# Patient Record
Sex: Female | Born: 1965
Health system: Southern US, Community
[De-identification: ages and names within clinical notes are randomized; demographics above are authoritative.]

## PROBLEM LIST (undated history)

## (undated) DIAGNOSIS — E039 Hypothyroidism, unspecified: Secondary | ICD-10-CM

## (undated) DIAGNOSIS — E7800 Pure hypercholesterolemia, unspecified: Secondary | ICD-10-CM

## (undated) DIAGNOSIS — I10 Essential (primary) hypertension: Secondary | ICD-10-CM

## (undated) DIAGNOSIS — R42 Dizziness and giddiness: Secondary | ICD-10-CM

## (undated) DIAGNOSIS — E119 Type 2 diabetes mellitus without complications: Secondary | ICD-10-CM

## (undated) DIAGNOSIS — A6004 Herpesviral vulvovaginitis: Secondary | ICD-10-CM

## (undated) DIAGNOSIS — E785 Hyperlipidemia, unspecified: Secondary | ICD-10-CM

## (undated) HISTORY — PX: TUBAL LIGATION: SHX77

## (undated) HISTORY — PX: OTHER SURGICAL HISTORY: SHX169

---
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
## 2000-08-31 HISTORY — PX: DILATION AND CURETTAGE, DIAGNOSTIC / THERAPEUTIC: SUR384

## 2000-10-20 ENCOUNTER — Other Ambulatory Visit: Admission: RE | Admit: 2000-10-20 | Discharge: 2000-10-20 | Payer: Self-pay | Admitting: Family Medicine

## 2000-12-16 ENCOUNTER — Ambulatory Visit (HOSPITAL_COMMUNITY): Admission: RE | Admit: 2000-12-16 | Discharge: 2000-12-16 | Payer: Self-pay | Admitting: Family Medicine

## 2000-12-16 ENCOUNTER — Encounter: Payer: Self-pay | Admitting: Family Medicine

## 2001-02-21 ENCOUNTER — Encounter: Payer: Self-pay | Admitting: Family Medicine

## 2001-02-21 ENCOUNTER — Ambulatory Visit (HOSPITAL_COMMUNITY): Admission: RE | Admit: 2001-02-21 | Discharge: 2001-02-21 | Payer: Self-pay | Admitting: Family Medicine

## 2001-05-04 ENCOUNTER — Encounter: Admission: RE | Admit: 2001-05-04 | Discharge: 2001-05-04 | Payer: Self-pay | Admitting: Obstetrics and Gynecology

## 2001-05-04 ENCOUNTER — Encounter: Payer: Self-pay | Admitting: Obstetrics and Gynecology

## 2001-06-01 ENCOUNTER — Encounter (INDEPENDENT_AMBULATORY_CARE_PROVIDER_SITE_OTHER): Payer: Self-pay | Admitting: *Deleted

## 2001-06-01 ENCOUNTER — Ambulatory Visit (HOSPITAL_COMMUNITY): Admission: RE | Admit: 2001-06-01 | Discharge: 2001-06-01 | Payer: Self-pay | Admitting: Obstetrics and Gynecology

## 2002-01-31 ENCOUNTER — Encounter: Admission: RE | Admit: 2002-01-31 | Discharge: 2002-01-31 | Payer: Self-pay | Admitting: Obstetrics and Gynecology

## 2002-01-31 ENCOUNTER — Encounter: Payer: Self-pay | Admitting: Obstetrics and Gynecology

## 2004-06-04 ENCOUNTER — Other Ambulatory Visit: Admission: RE | Admit: 2004-06-04 | Discharge: 2004-06-04 | Payer: Self-pay | Admitting: Family Medicine

## 2004-12-04 ENCOUNTER — Emergency Department (HOSPITAL_COMMUNITY): Admission: EM | Admit: 2004-12-04 | Discharge: 2004-12-04 | Payer: Self-pay | Admitting: Family Medicine

## 2005-04-10 ENCOUNTER — Emergency Department (HOSPITAL_COMMUNITY): Admission: EM | Admit: 2005-04-10 | Discharge: 2005-04-10 | Payer: Self-pay | Admitting: Family Medicine

## 2006-10-07 ENCOUNTER — Emergency Department (HOSPITAL_COMMUNITY): Admission: EM | Admit: 2006-10-07 | Discharge: 2006-10-07 | Payer: Self-pay | Admitting: Family Medicine

## 2006-10-14 ENCOUNTER — Other Ambulatory Visit: Admission: RE | Admit: 2006-10-14 | Discharge: 2006-10-14 | Payer: Self-pay | Admitting: Family Medicine

## 2008-05-31 ENCOUNTER — Encounter: Payer: Self-pay | Admitting: Gynecology

## 2008-05-31 ENCOUNTER — Ambulatory Visit: Payer: Self-pay | Admitting: Gynecology

## 2008-05-31 ENCOUNTER — Other Ambulatory Visit: Admission: RE | Admit: 2008-05-31 | Discharge: 2008-05-31 | Payer: Self-pay | Admitting: Gynecology

## 2008-06-07 ENCOUNTER — Ambulatory Visit: Payer: Self-pay | Admitting: Gynecology

## 2008-06-20 ENCOUNTER — Encounter: Admission: RE | Admit: 2008-06-20 | Discharge: 2008-06-20 | Payer: Self-pay | Admitting: Gynecology

## 2009-05-20 ENCOUNTER — Ambulatory Visit: Payer: Self-pay | Admitting: Gynecology

## 2009-05-22 ENCOUNTER — Ambulatory Visit: Payer: Self-pay | Admitting: Gynecology

## 2009-05-23 ENCOUNTER — Encounter: Payer: Self-pay | Admitting: Gynecology

## 2009-05-23 ENCOUNTER — Ambulatory Visit: Payer: Self-pay | Admitting: Gynecology

## 2009-05-23 ENCOUNTER — Ambulatory Visit (HOSPITAL_BASED_OUTPATIENT_CLINIC_OR_DEPARTMENT_OTHER): Admission: RE | Admit: 2009-05-23 | Discharge: 2009-05-23 | Payer: Self-pay | Admitting: Gynecology

## 2009-06-10 ENCOUNTER — Ambulatory Visit: Payer: Self-pay | Admitting: Gynecology

## 2009-09-06 ENCOUNTER — Ambulatory Visit: Payer: Self-pay | Admitting: Gynecology

## 2009-10-07 ENCOUNTER — Other Ambulatory Visit: Admission: RE | Admit: 2009-10-07 | Discharge: 2009-10-07 | Payer: Self-pay | Admitting: Gynecology

## 2009-10-09 ENCOUNTER — Ambulatory Visit: Payer: Self-pay | Admitting: Gynecology

## 2009-10-18 ENCOUNTER — Encounter: Admission: RE | Admit: 2009-10-18 | Discharge: 2009-10-18 | Payer: Self-pay | Admitting: Gynecology

## 2010-10-23 ENCOUNTER — Other Ambulatory Visit (HOSPITAL_COMMUNITY)
Admission: RE | Admit: 2010-10-23 | Discharge: 2010-10-23 | Disposition: A | Discharge status: Home / Self Care | Payer: BC Managed Care – PPO | Source: Ambulatory Visit | Attending: Family Medicine | Admitting: Family Medicine

## 2010-10-23 ENCOUNTER — Other Ambulatory Visit: Payer: Self-pay | Admitting: Family Medicine

## 2010-10-23 DIAGNOSIS — Z0000 Encounter for general adult medical examination without abnormal findings: Secondary | ICD-10-CM | POA: Insufficient documentation

## 2010-10-31 ENCOUNTER — Other Ambulatory Visit: Payer: Self-pay | Admitting: Family Medicine

## 2010-10-31 DIAGNOSIS — Z1231 Encounter for screening mammogram for malignant neoplasm of breast: Secondary | ICD-10-CM

## 2010-11-10 ENCOUNTER — Ambulatory Visit
Admission: RE | Admit: 2010-11-10 | Discharge: 2010-11-10 | Disposition: A | Discharge status: Home / Self Care | Payer: BC Managed Care – PPO | Source: Ambulatory Visit | Attending: Family Medicine | Admitting: Family Medicine

## 2010-11-10 DIAGNOSIS — Z1231 Encounter for screening mammogram for malignant neoplasm of breast: Secondary | ICD-10-CM

## 2011-01-16 NOTE — H&P (Signed)
[facility]  Patient:    Diana Fleming, Diana Fleming Visit Number: [id number] MRN: [id number]          Service Type: Attending:  Beather Arbour. Thomasena Fleming, M.D. Dictated by:   Diana Fleming, M.D. Adm. Date:  06/01/01   CC:         Diana Fleming, M.D.   History and Physical  DATE OF BIRTH:                [birth year]  HISTORY OF PRESENT ILLNESS:   The patient is a 45 year old African-American female referred by Dr. Merri Fleming to rule out endometrial hyperplasia. Patient was complaining of lower abdominal pain with her menstrual cycles for which she subsequently had undergone a pelvic ultrasound.  Pelvic ultrasound showed a right ovarian cyst with subsequent resolution on a followup study. However, on ultrasound she was found to have an abnormally thickened endometrium measuring 15 mm and she had just completed her menstrual period. Patient does state that her cycles are every 28 days with a seven day duration of flow.  She uses three pads on her heaviest day.  She subsequently underwent a sonohysterogram which showed irregular nodular thickening of the endometrium with two polypoid masses possibly due to large polyp.  Endometrial carcinoma could not be ruled out.  Due to the polypoid configuration, it was decided that the patient should undergo a D&C, hysteroscopy to remove the polyp. Risks of surgery including anesthetic complications, hemorrhage, infection, damage to adjacent structures including bladder, bowel, blood vessels, ureters discussed with the patient.  She was made aware of the risk of uterine perforation which could result in overwhelming life threatening hemorrhage requiring emergent hysterectomy or uterine perforation which could result in bowel damage or resulting in overwhelming life threatening peritonitis or which could result in bowel damage requiring colostomy.  Patient expresses understanding of and acceptance of these risks and  desires to proceed.  PAST GYNECOLOGICAL HISTORY:   Menarche at age 45.  Contraception:  Tubal ligation.  Cycle interval every 28 days.  History of herpes genitalia. Patient has no history of abnormal Paps.  PAST MEDICAL HISTORY:         1. Resolved ovarian cyst.                               2. History of bacterial vaginosis.  ALLERGIES:                    No known drug allergies.  CURRENT MEDICATIONS:          Valtrex for HSV.  PAST SURGICAL HISTORY:        Tubal ligation November 22, 1991.  FAMILY HISTORY:               The patients father is unknown to her.  Mother is 68 with heart disease.  Patient has no siblings.  She has two children ages 1 and 55 who are alive and well.  SOCIAL HISTORY:               Native of [place].  Patient works in the Heritage manager at [employer].  She is married.  Does not smoke, drink, or use recreational drugs.  REVIEW OF SYSTEMS:            Noncontributory except as noted above.  Denies headaches, visual changes, chest pain, shortness of breath, abdominal pain, change in  bowel habits, unintentional weight loss.  Also denies dysuria, urgency, frequency, vaginal pruritus or discharge, pain or bleeding with intercourse.  PHYSICAL EXAMINATION  GENERAL:                      Well-developed African-American female.  VITAL SIGNS:                  Blood pressure 130/70, weight 211 pounds.  LMP May 11, 2001.  HEENT:                        Normal.  NECK:                         Supple without thyromegaly, adenopathy, or nodules.  CHEST:                        Clear to auscultation.  BREASTS:                      Symmetrical without masses, retraction, or nipple discharge.  CARDIAC:                      Regular rate and rhythm without extra sounds or murmurs.  ABDOMEN:                      Soft and nontender.  No hepatosplenomegaly or masses.  EXTREMITIES:                  No edema.  NEUROLOGIC:                    Oriented x 3.  Grossly normal.  PELVIC:                       Normal external female genitalia.  No vulvar, vaginal, cervical lesions.  Pap smear was performed by Dr. Merri Fleming recently and was noted to be within normal limits.  Bimanual examination reveals the uterus to be normal and mobile without any adnexal mass palpated.  IMPRESSION AND PLAN:          The patient is a 45 year old para 2 African-American female with thickened endometrium on ultrasound subsequently found to have possible endometrial polyps on sonohysterogram.  She is admitted for a dilatation and curettage, hysteroscopy to rule out endometrial hyperplasia or endometrial carcinoma and to remove the polyp.  Risks have been discussed with her and she expresses understanding of and acceptance of these risks. Dictated by:   Diana Fleming, M.D. Attending:  Beather Arbour. Thomasena Fleming, M.D. DD:  06/01/01 TD:  06/01/01 Job: [id number]

## 2011-01-16 NOTE — Op Note (Signed)
[facility]  Patient:    Diana Fleming, Diana Fleming Visit Number: [id number] MRN: [id number]          Service Type: Attending:  Beather Arbour. Thomasena Edis, M.D. Dictated by:   Beather Arbour Thomasena Edis, M.D.   CC:         Dario Guardian, M.D.   Operative Report  SURGEON:                      Lafonda Mosses B. Thomasena Edis, M.D.  ANESTHESIA:                   Monitored anesthesia care plus 1% lidocaine, 10 cc, paracervical block.  FLUIDS:                       2000 cc of crystalloid.  ESTIMATED BLOOD LOSS:         50 cc.  COMPLICATIONS:                None.  DRAINS:                       None.  FINDINGS:                     Copious polypoid endometrium which was significantly thickened.  DESCRIPTION OF PROCEDURE:     The patient was brought to the operating room and identified on the operating room table.  After the patient was adequately sedated using fentanyl, Versed and Diprivan, she was placed in the dorsal lithotomy position, prepped and draped in the usual sterile fashion.  The bladder was straight catheterized for approximately 100 cc of clear yellow urine.  Examination under anesthesia revealed the uterus to be approximately eight weeks and very anteverted.  There were no adnexal masses palpated.  The speculum was placed and the anterior lip of the cervix was infiltrated with 1 cc of 1% lidocaine and grasped with a single-tooth tenaculum.  The remaining 9 cc of 1% lidocaine was infused for paracervical block.  The cervix was then very gently dilated up to a #25 Pratt dilator.  The patients endometrial canal was noted to tract extremely anteriorly.  Dilatation proceeded very carefully and gently.  The [facility] hysteroscope was placed and, using Sorbitol as a distending medium, a very careful and thorough hysteroscopic examination was performed.  I was able to visualize both tubal ostia.  There was noted to be copious cystic-appearing polypoid endometrium which was very  markedly thickened.  After careful hysteroscopic evaluation, the hysteroscope was removed.  A very careful and thorough curettage was performed.  Copious tissue was obtained.  I had to curet numerous times to adequately remove this abnormal endometrium.  The Randall stone forceps were placed and additional polypoid tissue was obtained.  After a good cry was heard all around, the procedure was then terminated.  There was noted to be a small amount of bleeding of the tenaculum site.  Excellent hemostasis was achieved using silver nitrate sticks.  At that point, the procedure was terminated.  The patient tolerated the procedure well without apparent complications and was transferred the patient the recovery room in stable condition after all sponge, needle and instrument counts were correct.  The specimen was sent to pathology.  The patient received a postoperative instruction sheet, was urged to refrain from intercourse or anything in her vagina for 2-3 weeks.  She is to take ibuprofen, 400-600 mg q.6h. p.r.n.  pain.  She is to call with any problems.  She is to return in 2-3 weeks for postoperative evaluation. Dictated by:   Beather Arbour Thomasena Edis, M.D. Attending:  Beather Arbour. Thomasena Edis, M.D. DD:  06/01/01 TD:  06/01/01 Job: [id number]

## 2011-10-29 ENCOUNTER — Other Ambulatory Visit: Payer: Self-pay | Admitting: Nurse Practitioner

## 2011-10-29 ENCOUNTER — Other Ambulatory Visit (HOSPITAL_COMMUNITY)
Admission: RE | Admit: 2011-10-29 | Discharge: 2011-10-29 | Disposition: A | Discharge status: Home / Self Care | Payer: BC Managed Care – PPO | Source: Ambulatory Visit | Attending: Obstetrics and Gynecology | Admitting: Obstetrics and Gynecology

## 2011-10-29 DIAGNOSIS — Z1159 Encounter for screening for other viral diseases: Secondary | ICD-10-CM | POA: Insufficient documentation

## 2011-10-29 DIAGNOSIS — Z01419 Encounter for gynecological examination (general) (routine) without abnormal findings: Secondary | ICD-10-CM | POA: Insufficient documentation

## 2011-10-29 DIAGNOSIS — Z1231 Encounter for screening mammogram for malignant neoplasm of breast: Secondary | ICD-10-CM

## 2011-10-29 DIAGNOSIS — N760 Acute vaginitis: Secondary | ICD-10-CM | POA: Insufficient documentation

## 2011-11-11 ENCOUNTER — Ambulatory Visit
Admission: RE | Admit: 2011-11-11 | Discharge: 2011-11-11 | Disposition: A | Discharge status: Home / Self Care | Payer: BC Managed Care – PPO | Source: Ambulatory Visit | Attending: Nurse Practitioner | Admitting: Nurse Practitioner

## 2011-11-11 DIAGNOSIS — Z1231 Encounter for screening mammogram for malignant neoplasm of breast: Secondary | ICD-10-CM

## 2012-11-16 ENCOUNTER — Other Ambulatory Visit: Payer: Self-pay | Admitting: Nurse Practitioner

## 2012-11-16 ENCOUNTER — Other Ambulatory Visit (HOSPITAL_COMMUNITY)
Admission: RE | Admit: 2012-11-16 | Discharge: 2012-11-16 | Disposition: A | Discharge status: Home / Self Care | Payer: 59 | Source: Ambulatory Visit | Attending: Obstetrics and Gynecology | Admitting: Obstetrics and Gynecology

## 2012-11-16 DIAGNOSIS — Z01419 Encounter for gynecological examination (general) (routine) without abnormal findings: Secondary | ICD-10-CM | POA: Insufficient documentation

## 2012-11-16 DIAGNOSIS — N760 Acute vaginitis: Secondary | ICD-10-CM | POA: Insufficient documentation

## 2013-01-11 ENCOUNTER — Other Ambulatory Visit: Payer: Self-pay

## 2013-01-11 DIAGNOSIS — Z1231 Encounter for screening mammogram for malignant neoplasm of breast: Secondary | ICD-10-CM

## 2013-01-12 ENCOUNTER — Ambulatory Visit
Admission: RE | Admit: 2013-01-12 | Discharge: 2013-01-12 | Disposition: A | Discharge status: Home / Self Care | Payer: 59 | Source: Ambulatory Visit

## 2013-01-12 DIAGNOSIS — Z1231 Encounter for screening mammogram for malignant neoplasm of breast: Secondary | ICD-10-CM

## 2013-11-23 ENCOUNTER — Other Ambulatory Visit: Payer: Self-pay

## 2013-11-23 DIAGNOSIS — Z1231 Encounter for screening mammogram for malignant neoplasm of breast: Secondary | ICD-10-CM

## 2014-01-15 ENCOUNTER — Ambulatory Visit: Payer: 59

## 2018-01-31 ENCOUNTER — Encounter (HOSPITAL_COMMUNITY): Payer: Self-pay

## 2018-01-31 ENCOUNTER — Inpatient Hospital Stay (HOSPITAL_COMMUNITY)
Admission: EM | Admit: 2018-01-31 | Discharge: 2018-02-02 | DRG: 305 | Disposition: A | Discharge status: Home / Self Care | Payer: Self-pay | Attending: Oncology | Admitting: Oncology

## 2018-01-31 ENCOUNTER — Emergency Department (HOSPITAL_COMMUNITY): Payer: Self-pay

## 2018-01-31 DIAGNOSIS — E1165 Type 2 diabetes mellitus with hyperglycemia: Secondary | ICD-10-CM

## 2018-01-31 DIAGNOSIS — E876 Hypokalemia: Secondary | ICD-10-CM | POA: Diagnosis not present

## 2018-01-31 DIAGNOSIS — I10 Essential (primary) hypertension: Secondary | ICD-10-CM | POA: Diagnosis present

## 2018-01-31 DIAGNOSIS — Z79899 Other long term (current) drug therapy: Secondary | ICD-10-CM

## 2018-01-31 DIAGNOSIS — E119 Type 2 diabetes mellitus without complications: Secondary | ICD-10-CM | POA: Diagnosis present

## 2018-01-31 DIAGNOSIS — Z7984 Long term (current) use of oral hypoglycemic drugs: Secondary | ICD-10-CM

## 2018-01-31 DIAGNOSIS — Z91120 Patient's intentional underdosing of medication regimen due to financial hardship: Secondary | ICD-10-CM

## 2018-01-31 DIAGNOSIS — Z8249 Family history of ischemic heart disease and other diseases of the circulatory system: Secondary | ICD-10-CM

## 2018-01-31 DIAGNOSIS — T466X6A Underdosing of antihyperlipidemic and antiarteriosclerotic drugs, initial encounter: Secondary | ICD-10-CM | POA: Diagnosis present

## 2018-01-31 DIAGNOSIS — T375X6A Underdosing of antiviral drugs, initial encounter: Secondary | ICD-10-CM | POA: Diagnosis present

## 2018-01-31 DIAGNOSIS — E890 Postprocedural hypothyroidism: Secondary | ICD-10-CM | POA: Diagnosis present

## 2018-01-31 DIAGNOSIS — E782 Mixed hyperlipidemia: Secondary | ICD-10-CM | POA: Diagnosis present

## 2018-01-31 DIAGNOSIS — Z79890 Hormone replacement therapy (postmenopausal): Secondary | ICD-10-CM

## 2018-01-31 DIAGNOSIS — B009 Herpesviral infection, unspecified: Secondary | ICD-10-CM | POA: Diagnosis present

## 2018-01-31 DIAGNOSIS — R079 Chest pain, unspecified: Secondary | ICD-10-CM

## 2018-01-31 DIAGNOSIS — Z560 Unemployment, unspecified: Secondary | ICD-10-CM

## 2018-01-31 DIAGNOSIS — E039 Hypothyroidism, unspecified: Secondary | ICD-10-CM

## 2018-01-31 DIAGNOSIS — T465X6A Underdosing of other antihypertensive drugs, initial encounter: Secondary | ICD-10-CM | POA: Diagnosis present

## 2018-01-31 DIAGNOSIS — I160 Hypertensive urgency: Principal | ICD-10-CM | POA: Diagnosis present

## 2018-01-31 DIAGNOSIS — E1142 Type 2 diabetes mellitus with diabetic polyneuropathy: Secondary | ICD-10-CM

## 2018-01-31 DIAGNOSIS — T381X6A Underdosing of thyroid hormones and substitutes, initial encounter: Secondary | ICD-10-CM | POA: Diagnosis present

## 2018-01-31 DIAGNOSIS — Y92009 Unspecified place in unspecified non-institutional (private) residence as the place of occurrence of the external cause: Secondary | ICD-10-CM

## 2018-01-31 DIAGNOSIS — T383X6A Underdosing of insulin and oral hypoglycemic [antidiabetic] drugs, initial encounter: Secondary | ICD-10-CM | POA: Diagnosis present

## 2018-01-31 DIAGNOSIS — L309 Dermatitis, unspecified: Secondary | ICD-10-CM | POA: Diagnosis present

## 2018-01-31 HISTORY — DX: Type 2 diabetes mellitus without complications: E11.9

## 2018-01-31 HISTORY — DX: Essential (primary) hypertension: I10

## 2018-01-31 HISTORY — DX: Hyperlipidemia, unspecified: E78.5

## 2018-01-31 HISTORY — DX: Herpesviral vulvovaginitis: A60.04

## 2018-01-31 HISTORY — DX: Hypothyroidism, unspecified: E03.9

## 2018-01-31 LAB — BASIC METABOLIC PANEL
ANION GAP: 11 (ref 5–15)
BUN: 11 mg/dL (ref 6–20)
CALCIUM: 9.5 mg/dL (ref 8.9–10.3)
CO2: 25 mmol/L (ref 22–32)
Chloride: 102 mmol/L (ref 101–111)
Creatinine, Ser: 0.89 mg/dL (ref 0.44–1.00)
Glucose, Bld: 255 mg/dL — ABNORMAL HIGH (ref 65–99)
Potassium: 3.6 mmol/L (ref 3.5–5.1)
SODIUM: 138 mmol/L (ref 135–145)

## 2018-01-31 LAB — I-STAT BETA HCG BLOOD, ED (MC, WL, AP ONLY): I-stat hCG, quantitative: <5 m[IU]/mL (ref <5)

## 2018-01-31 LAB — TSH: TSH: 21.281 u[IU]/mL — AB (ref 0.350–4.500)

## 2018-01-31 LAB — CBC
HCT: 48.1 % — ABNORMAL HIGH (ref 36.0–46.0)
HEMOGLOBIN: 16.1 g/dL — AB (ref 12.0–15.0)
MCH: 29.8 pg (ref 26.0–34.0)
MCHC: 33.5 g/dL (ref 30.0–36.0)
MCV: 88.9 fL (ref 78.0–100.0)
PLATELETS: 210 10*3/uL (ref 150–400)
RBC: 5.41 MIL/uL — ABNORMAL HIGH (ref 3.87–5.11)
RDW: 12.6 % (ref 11.5–15.5)
WBC: 5.1 10*3/uL (ref 4.0–10.5)

## 2018-01-31 LAB — LIPID PANEL
Cholesterol: 280 mg/dL — ABNORMAL HIGH (ref 0–200)
HDL: 56 mg/dL (ref >40)
LDL CALC: 180 mg/dL — AB (ref 0–99)
Total CHOL/HDL Ratio: 5 RATIO
Triglycerides: 221 mg/dL — ABNORMAL HIGH (ref <150)
VLDL: 44 mg/dL — ABNORMAL HIGH (ref 0–40)

## 2018-01-31 LAB — I-STAT TROPONIN, ED: TROPONIN I, POC: 0 ng/mL (ref 0.00–0.08)

## 2018-01-31 LAB — HEMOGLOBIN A1C
HEMOGLOBIN A1C: 8.5 % — AB (ref 4.8–5.6)
MEAN PLASMA GLUCOSE: 197.25 mg/dL

## 2018-01-31 LAB — TROPONIN I

## 2018-01-31 LAB — GLUCOSE, CAPILLARY
Glucose-Capillary: 239 mg/dL — ABNORMAL HIGH (ref 65–99)
Glucose-Capillary: 347 mg/dL — ABNORMAL HIGH (ref 65–99)

## 2018-01-31 MED ORDER — ACETAMINOPHEN 325 MG PO TABS
650.0000 mg | ORAL_TABLET | Freq: Four times a day (QID) | ORAL | Status: DC | PRN
  Administered 2018-01-31: 650 mg via ORAL
  Filled 2018-01-31 (×2): qty 2

## 2018-01-31 MED ORDER — NITROGLYCERIN 0.4 MG SL SUBL: 0.4000 mg | SUBLINGUAL_TABLET | SUBLINGUAL | Status: DC | PRN

## 2018-01-31 MED ORDER — HYDRALAZINE HCL 20 MG/ML IJ SOLN
5.0000 mg | Freq: Three times a day (TID) | INTRAMUSCULAR | Status: DC | PRN
  Administered 2018-01-31: 5 mg via INTRAVENOUS
  Filled 2018-01-31: qty 1

## 2018-01-31 MED ORDER — ASPIRIN EC 81 MG PO TBEC
81.0000 mg | DELAYED_RELEASE_TABLET | Freq: Every day | ORAL | Status: DC
  Administered 2018-01-31 – 2018-02-02 (×3): 81 mg via ORAL
  Filled 2018-01-31 (×3): qty 1

## 2018-01-31 MED ORDER — METFORMIN HCL ER 500 MG PO TB24
1000.0000 mg | ORAL_TABLET | Freq: Two times a day (BID) | ORAL | Status: DC
  Administered 2018-01-31 – 2018-02-01 (×2): 1000 mg via ORAL
  Filled 2018-01-31 (×3): qty 2

## 2018-01-31 MED ORDER — ACETAMINOPHEN 650 MG RE SUPP: 650.0000 mg | Freq: Four times a day (QID) | RECTAL | Status: DC | PRN

## 2018-01-31 MED ORDER — LOSARTAN POTASSIUM 50 MG PO TABS
100.0000 mg | ORAL_TABLET | Freq: Every day | ORAL | Status: DC
  Administered 2018-01-31 – 2018-02-02 (×3): 100 mg via ORAL
  Filled 2018-01-31 (×3): qty 2

## 2018-01-31 MED ORDER — LEVOTHYROXINE SODIUM 175 MCG PO TABS
175.0000 ug | ORAL_TABLET | Freq: Every day | ORAL | Status: DC
  Administered 2018-02-01 – 2018-02-02 (×2): 175 ug via ORAL
  Filled 2018-01-31 (×2): qty 1

## 2018-01-31 MED ORDER — AMLODIPINE BESYLATE 5 MG PO TABS: 5.0000 mg | ORAL_TABLET | Freq: Every day | ORAL | Status: DC

## 2018-01-31 MED ORDER — VALACYCLOVIR HCL 500 MG PO TABS
500.0000 mg | ORAL_TABLET | Freq: Two times a day (BID) | ORAL | Status: DC
  Administered 2018-02-01: 500 mg via ORAL
  Filled 2018-01-31 (×3): qty 1

## 2018-01-31 MED ORDER — SIMVASTATIN 40 MG PO TABS
40.0000 mg | ORAL_TABLET | Freq: Every day | ORAL | Status: DC
  Administered 2018-01-31 – 2018-02-01 (×2): 40 mg via ORAL
  Filled 2018-01-31 (×2): qty 1

## 2018-01-31 MED ORDER — ENOXAPARIN SODIUM 40 MG/0.4ML ~~LOC~~ SOLN
40.0000 mg | SUBCUTANEOUS | Status: DC
  Administered 2018-01-31 – 2018-02-01 (×2): 40 mg via SUBCUTANEOUS
  Filled 2018-01-31 (×2): qty 0.4

## 2018-01-31 MED ORDER — LABETALOL HCL 5 MG/ML IV SOLN
10.0000 mg | Freq: Once | INTRAVENOUS | Status: AC
  Administered 2018-01-31: 10 mg via INTRAVENOUS
  Filled 2018-01-31: qty 4

## 2018-01-31 MED ORDER — HYDROCHLOROTHIAZIDE 25 MG PO TABS
25.0000 mg | ORAL_TABLET | Freq: Every day | ORAL | Status: DC
  Administered 2018-01-31 – 2018-02-02 (×3): 25 mg via ORAL
  Filled 2018-01-31 (×3): qty 1

## 2018-01-31 NOTE — ED Notes (Signed)
Admitting physicians at bedside.

## 2018-01-31 NOTE — Progress Notes (Signed)
Diana Fleming is a 52 y.o. female patient admitted from ED awake, alert - oriented  X 4 - no acute distress noted.  VSS - 191/105,  IV in place, occlusive dsg intact without redness.  Orientation to room, and floor completed with information packet given to patient/family.  Patient declined safety video at this time.  Admission INP armband ID verified with patient/family, and in place.   SR up x 2, fall assessment complete, with patient and family able to verbalize understanding of risk associated with falls, and verbalized understanding to call nsg before up out of bed.  Call light within reach, patient able to voice, and demonstrate understanding.  Skin, clean-dry- intact without evidence of bruising, or skin tears.   No evidence of skin break down noted on exam.   Paged provider to make aware glucose with labs at noon showed glucose of 255, blood pressure 191/105 with MAP of 130 and request diet change to heart healthy carb modified. Provider placed order for one time cbg, advised to give oral and IV blood pressure medications and changed diet to heart healthy.   Will cont to eval and treat per MD orders.  Casper HarrisonSamantha K Gearald Stonebraker, RN 01/31/2018 7:47 PM

## 2018-01-31 NOTE — ED Provider Notes (Signed)
[facility] Provider Note   CSN: [id number] Arrival date & time: 01/31/18  1144     History   Chief Complaint Chief Complaint  Patient presents with  . Chest Pain    HPI Diana Fleming is a 52 y.o. female.  HPI Patient has been out of her medications for about a month.  Complaints with chest pain.  History of hypertension diabetes and hypothyroidism.This morning began to have tightness in her mid chest.  It is a dull pressure.  Not so worse with exertion.  No fevers.  No cough.  No radiation.  Has not had pains like this before.  States she needs a new doctor because the last one kicked her out for not paying a bill. Past Medical History:  Diagnosis Date  . Diabetes mellitus, type 2 (HCC)   . Dyslipidemia   . 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 Hypertension   . Hypothyroidism     Patient Active Problem List   Diagnosis Date Noted  . Hypertensive urgency 01/31/2018      OB History   None      Home Medications    Prior to Admission medications   Medication Sig Start Date End Date Taking? Authorizing Provider  losartan-hydrochlorothiazide (HYZAAR) 100-25 MG tablet Take 1 tablet by mouth daily.    [provider]  metFORMIN (GLUCOPHAGE-XR) 500 MG 24 hr tablet Take 1,000 mg by mouth 2 (two) times daily.    [provider]  simvastatin (ZOCOR) 40 MG tablet Take 40 mg by mouth daily at 6 PM.    [provider]  SYNTHROID 175 MCG tablet Take 175 mcg by mouth daily. 11/29/17   [provider]  valACYclovir (VALTREX) 500 MG tablet Take 500 mg by mouth 2 (two) times daily. For 3 days for outbreaks.    [provider]    Family History Family History  Problem Relation Age of Onset  . Cancer Mother   . Heart attack Maternal Grandfather   . Heart attack Maternal Grandmother     Social History Social History   Tobacco Use  . Smoking status: Never Smoker  . Smokeless  tobacco: Never Used  Substance Use Topics  . Alcohol use: Never    Frequency: Never  . Drug use: Never     Allergies   Patient has no known allergies.   Review of Systems Review of Systems  Constitutional: Negative for appetite change and fever.  HENT: Negative for congestion.   Cardiovascular: Positive for chest pain.  Gastrointestinal: Negative for abdominal pain and nausea.  Endocrine: Negative for polyuria.  Genitourinary: Negative for dysuria.  Musculoskeletal: Negative for back pain.  Neurological: Negative for seizures.  Hematological: Negative for adenopathy.  Psychiatric/Behavioral: Negative for confusion.     Physical Exam Updated Vital Signs BP (!) 171/96 (BP Location: Left Arm)   Pulse 87   Temp 98 F (36.7 C) (Oral)   Resp 20   Ht 6\' 1"  (1.854 m)   Wt 96.5 kg (212 lb 11.9 oz)   LMP 12/05/2012   SpO2 100%   BMI 28.07 kg/m   Physical Exam  Constitutional: She appears well-developed.  HENT:  Head: Normocephalic.  Neck: Normal range of motion.  Cardiovascular: Normal rate and regular rhythm.  Pulmonary/Chest: Effort normal and breath sounds normal.  Abdominal: There is no tenderness.  Musculoskeletal: Normal range of motion.       Right lower leg: She exhibits no tenderness.  Left lower leg: She exhibits no tenderness.  Neurological: She is alert.  Skin: Capillary refill takes less than 2 seconds.     ED Treatments / Results  Labs (all labs ordered are listed, but only abnormal results are displayed) Labs Reviewed  BASIC METABOLIC PANEL - Abnormal; Notable for the following components:      Result Value   Glucose, Bld 255 (*)    All other components within normal limits  CBC - Abnormal; Notable for the following components:   RBC 5.41 (*)    Hemoglobin 16.1 (*)    HCT 48.1 (*)    All other components within normal limits  TSH - Abnormal; Notable for the following components:   TSH 21.281 (*)    All other components within normal  limits  HEMOGLOBIN A1C - Abnormal; Notable for the following components:   Hgb A1c MFr Bld 8.5 (*)    All other components within normal limits  LIPID PANEL - Abnormal; Notable for the following components:   Cholesterol 280 (*)    Triglycerides 221 (*)    VLDL 44 (*)    LDL Cholesterol 180 (*)    All other components within normal limits  GLUCOSE, CAPILLARY - Abnormal; Notable for the following components:   Glucose-Capillary 239 (*)    All other components within normal limits  GLUCOSE, CAPILLARY - Abnormal; Notable for the following components:   Glucose-Capillary 347 (*)    All other components within normal limits  TROPONIN I  TROPONIN I  HIV ANTIBODY (ROUTINE TESTING)  BASIC METABOLIC PANEL  TROPONIN I  I-STAT TROPONIN, ED  I-STAT BETA HCG BLOOD, ED ([facility], [facility], [facility] ONLY)    EKG EKG Interpretation  Date/Time:  Monday January 31 2018 12:48:56 EDT Ventricular Rate:  82 PR Interval:  172 QRS Duration: 96 QT Interval:  374 QTC Calculation: 437 R Axis:   17 Text Interpretation:  Sinus rhythm Low voltage, precordial leads Probable anteroseptal infarct, old Nonspecific T abnormalities, inferior leads no change from earlier today Confirmed by Benjiman Core [phone]) on 01/31/2018 1:42:40 PM   Radiology Dg Chest 2 View  Result Date: 01/31/2018 CLINICAL DATA:  Chest pain. EXAM: CHEST - 2 VIEW COMPARISON:  None. FINDINGS: The heart size and mediastinal contours are within normal limits. Both lungs are clear. No pneumothorax or pleural effusion is noted. The visualized skeletal structures are unremarkable. IMPRESSION: No active cardiopulmonary disease. Electronically Signed   By: Lupita Raider, M.D.   On: 01/31/2018 12:27    Procedures Procedures (including critical care time)  Medications Ordered in ED Medications  valACYclovir (VALTREX) tablet 500 mg (500 mg Oral Not Given 01/31/18 2216)  levothyroxine (SYNTHROID, LEVOTHROID) tablet 175 mcg (has no administration in time range)    simvastatin (ZOCOR) tablet 40 mg (40 mg Oral Given 01/31/18 1846)  metFORMIN (GLUCOPHAGE-XR) 24 hr tablet 1,000 mg (1,000 mg Oral Given 01/31/18 2216)  enoxaparin (LOVENOX) injection 40 mg (40 mg Subcutaneous Given 01/31/18 1847)  acetaminophen (TYLENOL) tablet 650 mg (650 mg Oral Given 01/31/18 2216)    Or  acetaminophen (TYLENOL) suppository 650 mg ( Rectal See Alternative 01/31/18 2216)  nitroGLYCERIN (NITROSTAT) SL tablet 0.4 mg (has no administration in time range)  aspirin EC tablet 81 mg (81 mg Oral Given 01/31/18 1846)  losartan (COZAAR) tablet 100 mg (100 mg Oral Given 01/31/18 1846)  hydrochlorothiazide (HYDRODIURIL) tablet 25 mg (25 mg Oral Given 01/31/18 1846)  hydrALAZINE (APRESOLINE) injection 5 mg (5 mg Intravenous Given 01/31/18 1846)  labetalol (  NORMODYNE,TRANDATE) injection 10 mg (10 mg Intravenous Given 01/31/18 1625)     Initial Impression / Assessment and Plan / ED Course  I have reviewed the triage vital signs and the nursing notes.  Pertinent labs & imaging results that were available during my care of the patient were reviewed by me and considered in my medical decision making (see chart for details).     Patient with chest pain and hypertension.  Noncompliance with her medications.  Lab work overall reassuring except for mild hyperglycemia, but does have new T wave inversion inferiorly and laterally on EKG.  With her moderate to severe hypertension this easily could be endorgan damage due to the high blood pressure.  With the chest pain I think she needs a more urgent lowering of the blood pressure.  Will admit to internal medicine for further evaluation and treatment.  Final Clinical Impressions(s) / ED Diagnoses   Final diagnoses:  Hypertensive urgency    ED Discharge Orders    None       Benjiman Core, MD 01/31/18 2237

## 2018-01-31 NOTE — H&P (Signed)
Date: 01/31/2018               Patient Name:  Diana Fleming W Diana Fleming MRN: [id number]  DOB: Aug 03, 1966 Age / Sex: 52 y.o., female   PCP: Patient, No Pcp Per              Medical Service: Internal Medicine Teaching Service              Attending Physician: Dr. Cyndie ChimeGranfortuna, Genene ChurnJames M, MD    First Contact: Alvira PhilipsWilliam King, MS 4 Pager: [phone]  Second Contact: Dr. Obie DredgeBlum Pager: [phone]            After Hours (After 5p/  First Contact Pager: [phone]  weekends / holidays): Second Contact Pager: [phone]   Chief Complaint: "I feel a warning in my chest"  History of Present Illness: Diana Fleming W Diana Fleming is a 52 y.o. female with past medical history significant for hypertension, dyslipidemia, and diabetes mellitus type 2 presenting with 8 hours of new-onset left sided chest pain. She ran out of all of her medication approximately 30 to 40 days ago. She did not have any symptoms between then and this morning. This morning she woke up, cleaned her house, and ate a ham sandwich while feeling fine. Around 0800, while sitting on the couch, she began experiencing 5/10 chest pain that was difficult to characterize. It started on the left side, radiating to the substernal midline. Over the next hour or so, it began radiating down her left arm. It feels like her left arm is numb. There were no alleviating or exacerbating factors. She initially had some belching, and attributed her chest pain to GERD. However, she had some mild shortness of breath "like a warning." She decided "something is wrong with this," and was brought to the hospital by her family. She describes some visual changes "kind of like the blurry vision I had before I got my glasses" on the way to the hospital. She also had some mild nausea without vomiting. The chest pain resolved when she lied down.  On arrival to the ED, she was found to have urgent hypertension, with BP 199/134. She remained quite hypertensive for the next several hours, with minimum BP  158/108. ECG showed T wave inversions that were new compared with prior ECG from 2010. She was given 10 mg IV labetalol, and her BP improved to 166/96. Electrolytes, creatinine, and complete blood count were within expected limits. Troponin was negative. She was admitted for workup of her chest pain and management of her hypertension.   Meds: No outpatient medications have been marked as taking for the 01/31/18 encounter [facility](Hospital Encounter).  She had not been taking any of her medication for 30 to 40 days prior to hospitalization.  Allergies: Allergies as of 01/31/2018  . (No Known Allergies)   Past Medical History:  Diagnosis Date  . Diabetes mellitus, type 2 (HCC)   . Dyslipidemia   . 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 Hypertension   . Hypothyroidism     Family History: Grandfather and grandmother had myocardial infarctions. Mother had cancer, she does not remember which type.  Social History: No alcohol, tobacco, or illicit drug use now or in the past. Lives with husband and granddaughter Diana Fleming.  Review of Systems: A complete ROS was negative except as per HPI.  Physical Exam: Blood pressure (!) 160/101, pulse 80, temperature 98 F (36.7 C), temperature source Oral, resp. rate 11, last menstrual period 12/05/2012, SpO2 95 %. GENERAL: Well-appearing  woman in no acute distress lying comfortably in stretcher. Pleasant, conversational, and does not appear to be uncomfortable. HEENT: Pupils equally round and reactive to light. Nares patent without erythema or edema bilaterally. Normal oropharynx without erythema. Palate elevates vertically. Thyroid not palpable. No thyroidectomy scar is visible. CARDIOVASCULAR: Extremities are warm and well-perfused. 3+ radial and dorsalis pedis pulses. Rate and rhythm are regular, with normal S1 and S2. There are no murmurs, rubs, or gallops. Mild chest wall tenderness over the 2nd and third ribs at approximately the left mid-clavicular  line. Jugular venous pulse is approximately 1 cm above the clavicle. Homans sign negative. PULMONARY: Normal work of breathing. Lungs are clear to auscultation bilaterally, with no wheezes, rales, or ronchi NEUROLOGIC: Orientation not formally assessed but grossly intact. Mental status normal. Cranial nerves II-XII are intact. Strength 5/5 throughout. ABDOMINAL: Mild tenderness to palpation in suprapubic region, which patient attributes to urge to void. Otherwise non-tender, non-distended, and mildly obese. There are no appreciable masses or organomegaly. Bowel sounds are normoactive.  EKG: personally reviewed my interpretation is: Rate of 82. Sinus rhythm. Normal axis. Normal intervals and segments. T wave inversions in inferior and lateral leads. Poor R wave progression across anterior leads.  CXR: personally reviewed my interpretation is: Normal  Assessment & Plan by Problem: Active Problems:   Hypertensive urgency  Chest pain New-onset left sided chest pain radiating to the left arm, accompanied by T wave inversions in the inferior and lateral leads is concerning for unstable angina vs non-ST elevation myocardial infarction. This distinction would be made with negative serial troponin. Pulmonary embolism seems less likely given her PERC score of 0. GERD remains on the differential, given her history of intermittent GERD. Musculoskeletal etiologies remain on the differential, given the tenderness to palpation over her chest wall. - Follow up repeat troponin - Repeat ECG 6/4 - Consult to cardiology tomorrow morning if troponin is positive - Nitroglycerin PRN for chest pain  Hypertensive urgency Presented with blood pressure of 199/134. Decreased to 166/96 with IV labetalol. Planning to resume home medications tomorrow, but she can have PRN medications overnight. 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 Telemetry status. Code status: Full code  Signed: Novella Rob, Medical Student 01/31/2018, 6:12 PM  Pager: [phone]

## 2018-01-31 NOTE — ED Triage Notes (Signed)
Pt presents for evaluation of chest pain starting this AM. Pt reports has been without thyroid medication for hypothyroidism x 30 days.

## 2018-02-01 ENCOUNTER — Encounter (HOSPITAL_COMMUNITY): Payer: Self-pay | Admitting: Cardiology

## 2018-02-01 DIAGNOSIS — Z79890 Hormone replacement therapy (postmenopausal): Secondary | ICD-10-CM

## 2018-02-01 DIAGNOSIS — I10 Essential (primary) hypertension: Secondary | ICD-10-CM

## 2018-02-01 DIAGNOSIS — E785 Hyperlipidemia, unspecified: Secondary | ICD-10-CM

## 2018-02-01 DIAGNOSIS — R110 Nausea: Secondary | ICD-10-CM

## 2018-02-01 DIAGNOSIS — R079 Chest pain, unspecified: Secondary | ICD-10-CM

## 2018-02-01 DIAGNOSIS — Z9114 Patient's other noncompliance with medication regimen: Secondary | ICD-10-CM

## 2018-02-01 DIAGNOSIS — Z8719 Personal history of other diseases of the digestive system: Secondary | ICD-10-CM

## 2018-02-01 DIAGNOSIS — E039 Hypothyroidism, unspecified: Secondary | ICD-10-CM

## 2018-02-01 DIAGNOSIS — Z7984 Long term (current) use of oral hypoglycemic drugs: Secondary | ICD-10-CM

## 2018-02-01 DIAGNOSIS — B0089 Other herpesviral infection: Secondary | ICD-10-CM

## 2018-02-01 DIAGNOSIS — Z79899 Other long term (current) drug therapy: Secondary | ICD-10-CM

## 2018-02-01 DIAGNOSIS — E1142 Type 2 diabetes mellitus with diabetic polyneuropathy: Secondary | ICD-10-CM

## 2018-02-01 DIAGNOSIS — E876 Hypokalemia: Secondary | ICD-10-CM

## 2018-02-01 DIAGNOSIS — R072 Precordial pain: Secondary | ICD-10-CM

## 2018-02-01 DIAGNOSIS — E119 Type 2 diabetes mellitus without complications: Secondary | ICD-10-CM

## 2018-02-01 DIAGNOSIS — I160 Hypertensive urgency: Principal | ICD-10-CM

## 2018-02-01 LAB — BASIC METABOLIC PANEL
Anion gap: 14 (ref 5–15)
BUN: 13 mg/dL (ref 6–20)
CO2: 24 mmol/L (ref 22–32)
Calcium: 9.3 mg/dL (ref 8.9–10.3)
Chloride: 96 mmol/L — ABNORMAL LOW (ref 101–111)
Creatinine, Ser: 0.97 mg/dL (ref 0.44–1.00)
Glucose, Bld: 250 mg/dL — ABNORMAL HIGH (ref 65–99)
POTASSIUM: 3.4 mmol/L — AB (ref 3.5–5.1)
SODIUM: 134 mmol/L — AB (ref 135–145)

## 2018-02-01 LAB — GLUCOSE, CAPILLARY
GLUCOSE-CAPILLARY: 245 mg/dL — AB (ref 65–99)
GLUCOSE-CAPILLARY: 245 mg/dL — AB (ref 65–99)
GLUCOSE-CAPILLARY: 195 mg/dL — AB (ref 65–99)
GLUCOSE-CAPILLARY: 177 mg/dL — AB (ref 65–99)
Glucose-Capillary: 167 mg/dL — ABNORMAL HIGH (ref 65–99)

## 2018-02-01 LAB — TROPONIN I: Troponin I: <0.03 ng/mL (ref <0.03)

## 2018-02-01 LAB — HIV ANTIBODY (ROUTINE TESTING W REFLEX): HIV SCREEN 4TH GENERATION: NONREACTIVE

## 2018-02-01 MED ORDER — GI COCKTAIL ~~LOC~~
30.0000 mL | Freq: Once | ORAL | Status: AC
  Administered 2018-02-01: 30 mL via ORAL
  Filled 2018-02-01: qty 30

## 2018-02-01 MED ORDER — METOPROLOL TARTRATE 50 MG PO TABS
50.0000 mg | ORAL_TABLET | Freq: Once | ORAL | Status: AC
  Administered 2018-02-01: 50 mg via ORAL
  Filled 2018-02-01: qty 1

## 2018-02-01 MED ORDER — POTASSIUM CHLORIDE CRYS ER 20 MEQ PO TBCR
60.0000 meq | EXTENDED_RELEASE_TABLET | Freq: Once | ORAL | Status: AC
  Administered 2018-02-01: 60 meq via ORAL
  Filled 2018-02-01: qty 3

## 2018-02-01 MED ORDER — INSULIN GLARGINE 100 UNIT/ML ~~LOC~~ SOLN
3.0000 [IU] | Freq: Every day | SUBCUTANEOUS | Status: DC
Start: 2018-02-01 — End: 2018-02-02
  Administered 2018-02-01 – 2018-02-02 (×2): 3 [IU] via SUBCUTANEOUS
  Filled 2018-02-01 (×3): qty 0.03

## 2018-02-01 MED ORDER — AMLODIPINE BESYLATE 10 MG PO TABS
10.0000 mg | ORAL_TABLET | Freq: Every day | ORAL | Status: DC
  Administered 2018-02-01 – 2018-02-02 (×2): 10 mg via ORAL
  Filled 2018-02-01 (×2): qty 1

## 2018-02-01 NOTE — Progress Notes (Signed)
Subjective: She is feeling well this morning. Her chest and arm pain have resolved. She still had some mild nausea this morning, but was able to eat some crackers, which resolved her nausea. She continues to have difficulty characterizing her chest pain, since "it's not like anything I can describe." She has never had any prior cardiac workup in the past, nor has she ever been diagnosed with any heart disease.  Objective:  Vital signs in last 24 hours: Vitals:   01/31/18 1818 01/31/18 1905 01/31/18 2146 02/01/18 0525  BP: (!) 191/105 (!) 168/101 (!) 171/96 (!) 154/87  Pulse: 85 89 87 82  Resp: 20   18  Temp: 98 F (36.7 C)   97.8 F (36.6 C)  TempSrc: Oral     SpO2: 97%  100% 97%  Weight: 96.5 kg (212 lb 11.9 oz)     Height: 6\' 1"  (1.854 m)      Weight change:   Intake/Output Summary (Last 24 hours) at 02/01/2018 1139 Last data filed at 02/01/2018 0930 Gross per 24 hour  Intake 240 ml  Output -  Net 240 ml   GENERAL: Well-appearing woman in no acute distress sitting on side of bed. Pleasant, conversational, and does not appear to be uncomfortable. HEENT: Pupils equally round and reactive to light. Nares patent without erythema or edema bilaterally. Normal oropharynx without erythema. Palate elevates vertically. Thyroid not palpable. No thyroidectomy scar is visible. CARDIOVASCULAR: Extremities are warm and well-perfused. Radial and dorsalis pedis pulses are now 2+. Rate and rhythm are regular, with normal S1 and S2. There are no murmurs, rubs, or gallops. Mild chest wall tenderness has resolved. Jugular venous pulse is approximately 1 cm above the clavicle. Homans sign negative. PULMONARY: Normal work of breathing. Lungs are clear to auscultation bilaterally, with no wheezes, rales, or ronchi NEUROLOGIC: Orientation not formally assessed but grossly intact. Mental status normal. Cranial nerves II-XII are intact. Strength 5/5 throughout. ABDOMINAL: Non-tender, non-distended, and  mildly obese. Mild tenderness to palpation in suprapubic region has resolved. There are no appreciable masses or organomegaly. Bowel sounds are normoactive.  Assessment/Plan:  Active Problems:   Hypertensive urgency  Chest pain New-onset left sided chest pain radiating to the left arm, accompanied by T wave inversions in the inferior and lateral leads is concerning for unstable angina vs non-ST elevation myocardial infarction. Negative serial troponin is indicative of unstable angina. She does have multiple risk factors for coronary disease, including dyslipidemia, hypertension, diabetes mellitus type 2, and thyroid dysfunction. Pulmonary embolism seems less likely given her PERC score of 0. GERD remains on the differential, given her history of intermittent GERD, although this seems less likely due to the radiation of pain to her left arm and the patient's characterization that this pain feels "completely different." Musculoskeletal etiologies remain on the differential, given the tenderness to palpation over her chest wall on presentation. - Repeat ECG today - Consult to cardiology for possible ischemic workup - Nitroglycerin PRN for chest pain  Hypertensive urgency Presented with blood pressure of 199/134. Decreased to 166/96 with IV labetalol. Anticipate improvement in blood pressure with resumption of home medication regimen today. - Losartan 100 mg QD - Hydrochlorothiazide 25 mg QD - IV Hydralazine PRN for systolic blood pressure >180  Dyslipidemia Lipid panel shows total cholesterol of 280, high density lipoprotein of 56, low density lipoprotein of 180, and triglycerides of 221. - Simvastatin 40 mg QD  Vulvovaginal herpes simplex dermatitis - Valtrex 500 BID  Diabetes mellitus type 2 A1c  of 8.5% in the setting of medication non-adherence. She takes metformin 1000 BID at home. - Titrate medication regimen as an outpatient. - start lantus 3 units daily   Hypothyroid She  describes going through a radiation procedure for something that "they thought might have been cancer." TSH was 21.3 in the setting of recent unavailability of her home medication regimen. - Synthroid 175 mg QD  Hypokalemia Potassium of 3.4 this morning in the setting of poor PO intake yesterday. - Potassium chloride 60 mEq PO - Repeat electrolyte panel 6/5  DVT prophylaxis: Lovenox Dispo: Admit patient to Inpatient with expected length of stay greater than 2 midnights. Telemetry status. Possible discharge 6/5 pending cardiology recommendations. Code status: Full code   LOS: 1 day   Novella Rob, Medical Student 02/01/2018, 11:39 AM

## 2018-02-01 NOTE — Consult Note (Signed)
Cardiology Consultation:   Patient ID: Diana Fleming; [id number]; 05/19/1966   Admit date: 01/31/2018 Date of Consult: 02/01/2018  Primary Care Provider: Patient, No Pcp Per Primary Cardiologist: New-Dr. Duke Salvia  Patient Profile:   Diana Fleming is a 52 y.o. female with a hx of HTN, dyslipidemia and DM2  who is being seen today for the evaluation of chest pain at the request of Dr. Obie Dredge.  History of Present Illness:   Diana Fleming is a 52 yoF with a hx as stated above who presented to the ED on 01/31/18 with left-sided, anterior chest pain which began yesterday morning approximately 8AM. She states that she had been in her usual state of health until that time. She initially woke up and proceeded to clean her two-story home. While taking items up and down the stairs, she began to have chest discomfort rated a 5-6/10 in severity. Due to her chest discomfort, she proceeded to sit down and rest without much relief. She reports no associated symptoms such as SOB, palpitations or dizziness, however states that the pain began to radiate to her left arm and to her substernal region. She also had some left arm numbness and tingling. She states that she felt as if "something was not right" and proceeded to the ED for further evaluation. She denies current chest pain, orthopnea, LE swelling, dizziness or syncope. She denies tobacco or alcohol use. She has no personal hx of CAD, however has a family hx of MI in her maternal grandmother and grandfather. She has significant risk factors for CAD including uncontrolled HTN, DM2 and HLD. Diana Fleming states that she has been without a job since October 2018 and has not been able to take any of her medications for the last month including her antihypertensives, metformin, and levothyroxine.   In the ED, she was found to be markedly hypertensive at 199/134. She was given IV labetalol which reduced her pressures to the range 160's/90's. An EKG was performed which showed  T wave inversion in inferior and lateral leads with no comparison tracing on record. Troponin levels have been negative <0.03, <0.03<0.03. TSH was markedly elevated at 21.281. CXR showed no acute cardiopulmonary disease.   Cardiology was ultimately consulted for chest pain and hypertension management.   Past Medical History:  Diagnosis Date  . Diabetes mellitus, type 2 (HCC)   . Dyslipidemia   . 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 Hypertension   . Hypothyroidism    The histories are not reviewed yet. Please review them in the "History" navigator section and refresh this SmartLink.   Prior to Admission medications   Medication Sig Start Date End Date Taking? Authorizing Provider  losartan-hydrochlorothiazide (HYZAAR) 100-25 MG tablet Take 1 tablet by mouth daily.    [provider]  metFORMIN (GLUCOPHAGE-XR) 500 MG 24 hr tablet Take 1,000 mg by mouth 2 (two) times daily.    [provider]  simvastatin (ZOCOR) 40 MG tablet Take 40 mg by mouth daily at 6 PM.    [provider]  SYNTHROID 175 MCG tablet Take 175 mcg by mouth daily. 11/29/17   [provider]  valACYclovir (VALTREX) 500 MG tablet Take 500 mg by mouth 2 (two) times daily. For 3 days for outbreaks.    [provider]   Inpatient Medications: Scheduled Meds: . aspirin EC  81 mg Oral Daily  . enoxaparin (LOVENOX) injection  40 mg Subcutaneous Q24H  . hydrochlorothiazide  25 mg Oral Daily  .  levothyroxine  175 mcg Oral QAC breakfast  . losartan  100 mg Oral Daily  . metFORMIN  1,000 mg Oral BID  . simvastatin  40 mg Oral q1800  . valACYclovir  500 mg Oral BID   Continuous Infusions:  PRN Meds: acetaminophen **OR** acetaminophen, hydrALAZINE, nitroGLYCERIN  Allergies:   No Known Allergies  Social History:   Social History   Socioeconomic History  . Marital status: Married    Spouse name: Not on file  . Number of children: Not on file  . Years of education:  Not on file  . Highest education level: Not on file  Occupational History  . Not on file  Social Needs  . Financial resource strain: Not on file  . Food insecurity:    Worry: Not on file    Inability: Not on file  . Transportation needs:    Medical: Not on file    Non-medical: Not on file  Tobacco Use  . Smoking status: Never Smoker  . Smokeless tobacco: Never Used  Substance and Sexual Activity  . Alcohol use: Never    Frequency: Never  . Drug use: Never  . Sexual activity: Not on file  Lifestyle  . Physical activity:    Days per week: Not on file    Minutes per session: Not on file  . Stress: Not on file  Relationships  . Social connections:    Talks on phone: Not on file    Gets together: Not on file    Attends religious service: Not on file    Active member of club or organization: Not on file    Attends meetings of clubs or organizations: Not on file    Relationship status: Not on file  . Intimate partner violence:    Fear of current or ex partner: Not on file    Emotionally abused: Not on file    Physically abused: Not on file    Forced sexual activity: Not on file  Other Topics Concern  . Not on file  Social History Narrative  . Not on file    Family History:   Family History  Problem Relation Age of Onset  . Cancer Mother   . Heart attack Maternal Grandfather   . Heart attack Maternal Grandmother    Family Status:  Family Status  Relation Name Status  . Mother  (Not Specified)  . MGF  (Not Specified)  . MGM  (Not Specified)    ROS:  Please see the history of present illness.  All other ROS reviewed and negative.     Physical Exam/Data:   Vitals:   01/31/18 1905 01/31/18 2146 02/01/18 0525 02/01/18 1344  BP: (!) 168/101 (!) 171/96 (!) 154/87 (!) 163/99  Pulse: 89 87 82 91  Resp:   18 18  Temp:   97.8 F (36.6 C) 98.1 F (36.7 C)  TempSrc:    Oral  SpO2:  100% 97% 98%  Weight:      Height:        Intake/Output Summary (Last 24 hours)  at 02/01/2018 1413 Last data filed at 02/01/2018 0930 Gross per 24 hour  Intake 240 ml  Output -  Net 240 ml   Filed Weights   01/31/18 1818  Weight: 212 lb 11.9 oz (96.5 kg)   Body mass index is 28.07 kg/m.   General: Well developed, well nourished, NAD Skin: Warm, dry, intact  Head: Normocephalic, atraumatic,  clear, moist mucus membranes. Neck: Negative for carotid bruits.  No JVD Lungs:Clear to ausculation bilaterally. No wheezes, rales, or rhonchi. Breathing is unlabored. Cardiovascular: RRR with S1 S2. No murmurs, rubs or gallops Abdomen: Soft, non-tender, non-distended with normoactive bowel sounds.  No obvious abdominal masses. MSK: Strength and tone appear normal for age. 5/5 in all extremities Extremities: No edema. No clubbing or cyanosis. DP/Diana Fleming pulses 2+ bilaterally Neuro: Alert and oriented. No focal deficits. No facial asymmetry. MAE spontaneously. Psych: Responds to questions appropriately with normal affect.     EKG:  The EKG was personally reviewed and demonstrates: 02/01/18 NSR HR 95 with T wave inversion in inferior, lateral leads Telemetry:  Telemetry was personally reviewed and demonstrates:  Not currently on telemetry   Relevant CV Studies:  ECHO: None  CATH: None   Laboratory Data:  Chemistry Recent Labs  Lab 01/31/18 1156 02/01/18 0502  NA 138 134*  K 3.6 3.4*  CL 102 96*  CO2 25 24  GLUCOSE 255* 250*  BUN 11 13  CREATININE 0.89 0.97  CALCIUM 9.5 9.3  GFRNONAA >60 >60  GFRAA >60 >60  ANIONGAP 11 14    No results found for: PROT, ALBUMIN, AST, ALT, ALKPHOS, BILITOT Hematology Recent Labs  Lab 01/31/18 1156  WBC 5.1  RBC 5.41*  HGB 16.1*  HCT 48.1*  MCV 88.9  MCH 29.8  MCHC 33.5  RDW 12.6  PLT 210   Cardiac Enzymes Recent Labs  Lab 01/31/18 1845 02/01/18 0007 02/01/18 0502  TROPONINI <0.03 <0.03 <0.03    Recent Labs  Lab 01/31/18 1210  TROPIPOC 0.00    BNPNo results for input(s): BNP, PROBNP in the last 168 hours.    DDimer No results for input(s): DDIMER in the last 168 hours. TSH:  Lab Results  Component Value Date   TSH 21.281 (H) 01/31/2018   Lipids: Lab Results  Component Value Date   CHOL 280 (H) 01/31/2018   HDL 56 01/31/2018   LDLCALC 180 (H) 01/31/2018   TRIG 221 (H) 01/31/2018   CHOLHDL 5.0 01/31/2018   HgbA1c: Lab Results  Component Value Date   HGBA1C 8.5 (H) 01/31/2018   Radiology/Studies:  Dg Chest 2 View  Result Date: 01/31/2018 CLINICAL DATA:  Chest pain. EXAM: CHEST - 2 VIEW COMPARISON:  None. FINDINGS: The heart size and mediastinal contours are within normal limits. Both lungs are clear. No pneumothorax or pleural effusion is noted. The visualized skeletal structures are unremarkable. IMPRESSION: No active cardiopulmonary disease. Electronically Signed   By: Lupita RaiderJames  Green Jr, M.D.   On: 01/31/2018 12:27   Assessment and Plan:   1. Chest pain: -Diana Fleming admitted to internal medicine service with left-sided chest pain with radiation to the left arm and associated arm and hand numbness.  -EKG with T wave inversions in the inferior and lateral leads with no comparison tracing  -Trop, <0.03, <0.03, <0.03 -Currently denies chest pain, has had no recurrence since admission -Will check echocardiogram for wall motion abnormalities and LV function>>if abnormal consider coronary CT for further ischemic evaluation given her controlled heart rate and stable renal function. If normal, likely chest pain was in the setting of uncontrolled hypertension -50mg  lopressor 1 hour before coronary CT  -Continue ASA, losartan   2. Hypertensive urgency: -Elevated, 154/87>171/96>168/101 -Continue hydrochlorothiazide 25mg , losartan 100 -Will add amlodipine 10mg   -Will need close follow up with PCP at d/c   3. Dyslipidemia: -Unstable, CHO-280, LDL-180, HDL-56, Trigs-221 -Continue to simvastatin   4. DM2: -Glucose on admission, 250 -Metformin for now, if cardiac cath planned>>will need to  hold   -Consider transitioning  -HbA1C, 8.5 -Will need close follow up with PCP at d/c   5. Uncontrolled hypothyroidism: -TSH, 21.281 -Expected elevation in TSH given that the patient has not been taking medication for the last month.  -Will need close follow up with PCP at d/c    For questions or updates, please contact [facility] Please consult [email or web address] for contact info under Cardiology/STEMI.   SignedGeorgie Chard NP-C [facility] Pager: [phone] 02/01/2018 2:13 PM   <

## 2018-02-01 NOTE — Progress Notes (Addendum)
Inpatient Diabetes Program Recommendations  AACE/ADA: New Consensus Statement on Inpatient Glycemic Control (2015)  Target Ranges:  Prepandial:   less than 140 mg/dL      Peak postprandial:   less than 180 mg/dL (1-2 hours)      Critically ill patients:  140 - 180 mg/dL   Results for Diana Fleming, Diana Fleming (MRN [id number]) as of 02/01/2018 11:20  Ref. Range 01/31/2018 19:03 01/31/2018 21:51 02/01/2018 08:00  Glucose-Capillary Latest Ref Range: 65 - 99 mg/dL 409239 (H) 811347 (H) 914245 (H)    Review of Glycemic Control  Diabetes history: DM 2 Outpatient Diabetes medications: Metformin 1000 mg BID Current orders for Inpatient glycemic control: Metformin 1000 mg BID  Inpatient Diabetes Program Recommendations:    Patient's glucose in the 200-300 range, above inpatient goal 140-180 mg/dl. Please consider CBGs and Novolog Moderate Correction 0-15 units tid + Novolog HS scale 0-5 units.  Per ADA guidelines also recommend d/cing oral DM medications while inpatient.  A1c 8.5% uncontrolled at home.  Thanks,  Christena DeemShannon Milanya Sunderland RN, MSN, BC-ADM, [facility] Inpatient Diabetes Coordinator Team Pager [phone] (8a-5p)

## 2018-02-02 ENCOUNTER — Inpatient Hospital Stay (HOSPITAL_COMMUNITY): Payer: Self-pay

## 2018-02-02 DIAGNOSIS — R079 Chest pain, unspecified: Secondary | ICD-10-CM

## 2018-02-02 LAB — ECHOCARDIOGRAM COMPLETE
Height: 73 in
Weight: 3403.9 oz

## 2018-02-02 LAB — BASIC METABOLIC PANEL
ANION GAP: 11 (ref 5–15)
BUN: 17 mg/dL (ref 6–20)
CHLORIDE: 96 mmol/L — AB (ref 101–111)
CO2: 28 mmol/L (ref 22–32)
Calcium: 9.9 mg/dL (ref 8.9–10.3)
Creatinine, Ser: 0.99 mg/dL (ref 0.44–1.00)
GFR calc non Af Amer: >60 mL/min (ref >60)
Glucose, Bld: 220 mg/dL — ABNORMAL HIGH (ref 65–99)
POTASSIUM: 3.9 mmol/L (ref 3.5–5.1)
Sodium: 135 mmol/L (ref 135–145)

## 2018-02-02 LAB — GLUCOSE, CAPILLARY
GLUCOSE-CAPILLARY: 200 mg/dL — AB (ref 65–99)
Glucose-Capillary: 227 mg/dL — ABNORMAL HIGH (ref 65–99)

## 2018-02-02 MED ORDER — METOPROLOL TARTRATE 50 MG PO TABS
50.0000 mg | ORAL_TABLET | Freq: Once | ORAL | Status: AC
  Administered 2018-02-02: 50 mg via ORAL
  Filled 2018-02-02: qty 1

## 2018-02-02 MED ORDER — METOPROLOL TARTRATE 5 MG/5ML IV SOLN
INTRAVENOUS | Status: AC
  Filled 2018-02-02: qty 20

## 2018-02-02 MED ORDER — NITROGLYCERIN 0.4 MG SL SUBL
SUBLINGUAL_TABLET | SUBLINGUAL | Status: AC
  Filled 2018-02-02: qty 2

## 2018-02-02 MED ORDER — ATORVASTATIN CALCIUM 40 MG PO TABS: 40.0000 mg | ORAL_TABLET | Freq: Every day | ORAL | Status: DC

## 2018-02-02 MED ORDER — LOSARTAN POTASSIUM-HCTZ 100-25 MG PO TABS: 1.0000 | ORAL_TABLET | Freq: Every day | ORAL | 1 refills | Status: DC

## 2018-02-02 MED ORDER — AMLODIPINE BESYLATE 10 MG PO TABS: 10.0000 mg | ORAL_TABLET | Freq: Every day | ORAL | 3 refills | Status: DC

## 2018-02-02 MED ORDER — NITROGLYCERIN 0.4 MG SL SUBL
0.8000 mg | SUBLINGUAL_TABLET | Freq: Once | SUBLINGUAL | Status: AC
  Administered 2018-02-02: 0.8 mg via SUBLINGUAL

## 2018-02-02 MED ORDER — IOPAMIDOL (ISOVUE-370) INJECTION 76%
100.0000 mL | Freq: Once | INTRAVENOUS | Status: AC | PRN
  Administered 2018-02-02: 14:00:00 via INTRAVENOUS
  Administered 2018-02-02: 80 mL via INTRAVENOUS

## 2018-02-02 MED ORDER — IOPAMIDOL (ISOVUE-370) INJECTION 76%
INTRAVENOUS | Status: AC
  Filled 2018-02-02: qty 100

## 2018-02-02 MED ORDER — ATORVASTATIN CALCIUM 40 MG PO TABS: 40.0000 mg | ORAL_TABLET | Freq: Every day | ORAL | 1 refills | Status: DC

## 2018-02-02 MED ORDER — METOPROLOL TARTRATE 5 MG/5ML IV SOLN
5.0000 mg | INTRAVENOUS | Status: AC | PRN
  Administered 2018-02-02 (×4): 5 mg via INTRAVENOUS

## 2018-02-02 MED ORDER — SYNTHROID 175 MCG PO TABS
175.0000 ug | ORAL_TABLET | Freq: Every day | ORAL | 1 refills | Status: DC
Start: 2018-02-02 — End: 2018-02-09

## 2018-02-02 MED ORDER — METFORMIN HCL ER 500 MG PO TB24: 1000.0000 mg | ORAL_TABLET | Freq: Two times a day (BID) | ORAL | Status: DC

## 2018-02-02 MED ORDER — METFORMIN HCL ER 500 MG PO TB24: 1000.0000 mg | ORAL_TABLET | Freq: Two times a day (BID) | ORAL | 1 refills | Status: DC

## 2018-02-02 NOTE — Progress Notes (Signed)
Pt given discharge instructions, prescriptions, and care notes. Pt verbalized understanding AEB no further questions or concerns at this time. IV was discontinued, no redness, pain, or swelling noted at this time. Telemetry discontinued and Centralized Telemetry was notified. Pt left the floor via wheelchair with staff in stable condition. 

## 2018-02-02 NOTE — Discharge Summary (Signed)
Name: Diana Fleming MRN: [id number] DOB: 11-04-1965 52 y.o. PCP: Patient, No Pcp Per  Date of Admission: 01/31/2018 11:49 AM Date of Discharge: 02/02/2018 Attending Physician: Dr Cyndie ChimeGranfortuna  Discharge Diagnosis: Active Problems:   Hypertensive urgency   Chest pain   DM2 (diabetes mellitus, type 2) (HCC)   Hypothyroidism  Discharge Medications: Allergies as of 02/02/2018   No Known Allergies     Medication List    STOP taking these medications   losartan-hydrochlorothiazide 100-25 MG tablet Commonly known as:  HYZAAR   metFORMIN 500 MG 24 hr tablet Commonly known as:  GLUCOPHAGE-XR   simvastatin 40 MG tablet Commonly known as:  ZOCOR   SYNTHROID 175 MCG tablet Generic drug:  levothyroxine     TAKE these medications   VALTREX 500 MG tablet Generic drug:  valACYclovir Take 500 mg by mouth 2 (two) times daily. For 3 days for outbreaks.       Disposition and follow-up:   Diana Fleming [facility] in Stable [facility].  At the hospital follow up visit please address:  1.  Address antihyperglycemic regimen. She is being discharged on metformin XR 1000 mg BID. Her A1c is 8.5%. She will likely requrie addition of an additional agent as an outpatient.  2.  Labs / imaging needed at time of follow-up: Repeat TSH in 6 weeks. Recently re-started on Synthroid 175 mcg QD after not having access to medications for over a month.  3.  Pending labs/ test needing follow-up: None  Follow-up Appointments: Follow-up Information    [facility] Follow up on 02/09/2018.   Why:  10:50 am with Georgian CoAngela McClung PA Contact information: [address] [place] [phone]       Barrett, Joline SaltRhonda G, PA-C Follow up.   Specialties:  Cardiology, Radiology Why:  Cardiology hospital follow up on 02/23/18 at 8:30. Please arrive 15 minutes early for check in.  Contact information: [address] [place] [address] [phone]           Hospital Course by problem list: Active Problems:   Hypertensive urgency   Chest pain   DM2 (diabetes mellitus, type 2) (HCC)   Hypothyroidism  Diana Fleming is a 52 year old woman with multiple cardiac risk factors including dyslipidemia, hypertension, diabetes mellitus type 2, and thyroid dysfunction who was admitted due to chest pain.  Chest pain The etiology of her chest pain remained unclear. See discussion of differential diagnosis below. She presented with left-sided chest pain radiating to her substernal midline and left arm. The chst pain came on at rest, and had no alleviating or exacerbating factors. Blood pressure on presentation to the emergency department was 199/34. Electrocardiogram that time revealed T wave inversions in the inferior and lateral leads, as well as poor R wave progression across the anterior leads. Both of these findings were new compared with prior electrocardiogram from 2010. Cardiology was consulted due to concern for acute coronary syndrome. Transthoracic echocardiogram revealed a left ventricular ejection fraction of 60 to 65%, mild left ventricular hypertrophy, normal wall motion, grade 1 diastolic dysfunction, indeterminate left atrial filling pressure, aortic valve sclerosis, normal left atrial size, trivial tricuspid regurgitation, a right ventricular systolic pressure of 17 mmHg, and a normal inferior vena cava. All of these findings would be expected in someone of her age with longstanding hypertension. Coronary CT angiogram was entirely normal, with a coronary calcium score of zero, suggesting  a low risk for future coronary events. Cardiology plans to follow up as an outpatient.  The differential diagnosis for her chest pain remains broad, but the potentially dangerous etiologies have all been essentially ruled out. New-onset left sided chest pain radiating to the left arm, accompanied by  T wave inversions in the inferior and lateral leads was concerning for ACS. Negative serial troponins was suggestive of unstable angina. She does have multiple risk factors for coronary disease, including dyslipidemia, hypertension, diabetes mellitus type 2, and thyroid dysfunction. However, coronary CT angiogram was normal, pointing away from unstable angina. Transthoracic echocardiogram was essentially normal. Pulmonary embolism seemsless likelygiven her PERC score of 0. GERD remains on the differential, given her history of intermittent GERD, although this seems less likely due to the radiation of pain to her left arm and the patient's characterization that this pain feels "completely different." Musculoskeletal etiologies remain on the differential, given the tenderness to palpation over her chest wall on presentation. Vasospastic (formerly known as Prinzmental) angina remains on the differential as well, although this is rare.  Hypertensive urgency She presented with a blood pressure of 199/134 in the setting of recent medication non-compliance. She had not had access to medication in at least 30 days. Her blood pressure decreased to 166/96 with IV labetalol. It further decreased to 130/87 with resumption of home medications and addition of amlodipine.  Dyslipidemia Lipid panel shows total cholesterol of 280, high density lipoprotein of 56, low density lipoprotein of 180, and triglycerides of 221. She was switched from simvastatin 40 mg QD, which she had been taking previously, to rosuvastatin 40 mg QD.  Diabetes mellitus type 2 A1c was 8.5% on presentation in the setting of recent medication on-adherence. She had been taking metformin 500 BID at home. Metformin was initially held due to potential contrast load, and she was given Lantus 3 units QHS while inpatient. She is being discharge don metformin XR 1000 mg BID. She will likely require further titration of antihyperglycemics as an  outpatient.  Hypothyroidism She describes going through a radiation procedure for something that "they thought might have been cancer." It is not clear what this procedure was. TSH was 21.3 in the setting of recent unavailability of her home medication regimen. Synthroid 175 mcg QD was resumed.  Hypokalemia (resolved) Potassium of 3.4 in the setting of poor PO intake yesterday. Improved to 3.9 with 60 mEq of PO potassium chloride.  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  Discharge Vitals:   BP (!) 141/91   Pulse 65   Temp 97.8 F (36.6 C) (Oral)   Resp 18   Ht 6\' 1"  (1.854 m)   Wt 212 lb 11.9 oz (96.5 kg)   LMP 12/05/2012   SpO2 98%   BMI 28.07 kg/m   Pertinent Labs, Studies, and Procedures:  Transthoracic echocardiogram revealed a left ventricular ejection fraction of 60 to 65%, mild left ventricular hypertrophy, normal wall motion, grade 1 diastolic dysfunction, indeterminate left atrial filling pressure, aortic valve sclerosis, normal left atrial size, trivial tricuspid regurgitation, a right ventricular systolic pressure of 17 mmHg, and a normal inferior vena cava. All of these findings would be expected in someone of her age with longstanding hypertension. Coronary CT angiogram was entirely normal, with a coronary calcium score of zero.  Discharge Instructions: Discharge Instructions    Diet - low sodium heart healthy   Complete by:  As directed    Discharge instructions   Complete by:  As directed  You were admitted to the hospital because of chest pain. Fortunately, we have many reasons to believe that the cause of your chest pain was not something dangerous. The troponin, a lab we use to measure damage to your heart, was normal. Your echocardiogram, the ultrasound pictures of your heart, was normal. Your coronary CT angiogram, which we use to see the arteries around your heart, was normal.  In addition, there were several other things that  we were concerned about. We can attribute these other problems to you not having medication recently. Your blood pressure was 199/134, which is way too high, your thyroid hormone was way too low, and your cholesterol was way too high. We expect that these will improve once you are able to start taking your medications again.  We hope that you will be able to start taking your medications again. We have scheduled follow-up appointments with our clinic as well as with the cardiology clinic. The dates and times of these are listed above.  It was a pleasure taking care of you, and we wish you all the best!  Start taking the hyzaar and a new medication called amlodipine 5 mg daily  Start taking rosuvastatin 20 mg daily  Continue taking metformin 1000 mg twice daily  Your thyroid function was very low when you came in the hospital, please start taking the synthroid again and ask your physician to recheck this in a month   Increase activity slowly   Complete by:  As directed     You were admitted to the hospital because of chest pain. Fortunately, we have many reasons to believe that the cause of your chest pain was not something dangerous. The troponin, a lab we use to measure damage to your heart, was normal. Your echocardiogram, the ultrasound pictures of your heart, was normal. Your coronary CT angiogram, which we use to see the arteries around your heart, was normal.  In addition, there were several other things that we were concerned about. We can attribute these other problems to you not having medication recently. Your blood pressure was 199/134, which is way too high, your thyroid hormone was way too low, and your cholesterol was way too high. We expect that these will improve once you are able to start taking your medications again.  We hope that you will be able to start taking your medications again. We have scheduled follow-up appointments with our clinic as well as with the cardiology  clinic. The dates and times of these are listed above.  It was a pleasure taking care of you, and we wish you all the best!  Signed: Eulah Pont, MD 02/16/2018, 10:33 PM   Pager: [phone]

## 2018-02-02 NOTE — Progress Notes (Signed)
Medicine attending: I examined this patient today together with resident physician Dr. Eulah PontNina Blum and acting intern Mr. Chrissie NoaWilliam came and I concur with their evaluation and management plan which we discussed together. We appreciate prompt cardiology input.  We agree symptoms likely related to a hypertensive urgency but in view of multiple cardiac risk factors, echocardiogram and coronary CT angiogram recommended and will be done prior to discharge.  She remains asymptomatic.  Anticipate discharge later today once studies are completed unless unexpected pathology is found.

## 2018-02-02 NOTE — Progress Notes (Signed)
Progress Note  Patient Name: Diana Fleming Date of Encounter: 02/02/2018  Primary Cardiologist: No primary care provider on file.   Subjective   Feeling well.  No recurrent chest pain.   Inpatient Medications    Scheduled Meds: . amLODipine  10 mg Oral Daily  . aspirin EC  81 mg Oral Daily  . enoxaparin (LOVENOX) injection  40 mg Subcutaneous Q24H  . hydrochlorothiazide  25 mg Oral Daily  . insulin glargine  3 Units Subcutaneous Daily  . levothyroxine  175 mcg Oral QAC breakfast  . losartan  100 mg Oral Daily  . simvastatin  40 mg Oral q1800  . valACYclovir  500 mg Oral BID   Continuous Infusions:  PRN Meds: acetaminophen **OR** acetaminophen, hydrALAZINE, nitroGLYCERIN   Vital Signs    Vitals:   02/01/18 1344 02/01/18 2149 02/02/18 0521 02/02/18 0521  BP: (!) 163/99 133/89 (!) 145/100 130/87  Pulse: 91 75 77 77  Resp: 18 18 18    Temp: 98.1 F (36.7 C) 97.9 F (36.6 C) 97.8 F (36.6 C)   TempSrc: Oral  Oral   SpO2: 98% 97% 95% 98%  Weight:      Height:       No intake or output data in the 24 hours ending 02/02/18 0959 Filed Weights   01/31/18 1818  Weight: 212 lb 11.9 oz (96.5 kg)    Telemetry    n/a - Personally Reviewed  ECG    N/a - Personally Reviewed  Physical Exam   VS:  BP 130/87   Pulse 77   Temp 97.8 F (36.6 C) (Oral)   Resp 18   Ht 6\' 1"  (1.854 m)   Wt 212 lb 11.9 oz (96.5 kg)   LMP 12/05/2012   SpO2 98%   BMI 28.07 kg/m  , BMI Body mass index is 28.07 kg/m. GENERAL:  Well appearing HEENT: Pupils equal round and reactive, fundi not visualized, oral mucosa unremarkable NECK:  No jugular venous distention, waveform within normal limits, carotid upstroke brisk and symmetric, no bruits LUNGS:  Clear to auscultation bilaterally HEART:  RRR.  PMI not displaced or sustained,S1 and S2 within normal limits, no S3, no S4, no clicks, no rubs, no murmurs ABD:  Flat, positive bowel sounds normal in frequency in pitch, no bruits, no  rebound, no guarding, no midline pulsatile mass, no hepatomegaly, no splenomegaly EXT:  2 plus pulses throughout, no edema, no cyanosis no clubbing SKIN:  No rashes no nodules NEURO:  Cranial nerves II through XII grossly intact, motor grossly intact throughout [facility]:  Cognitively intact, oriented to person place and time   Labs    Chemistry Recent Labs  Lab 01/31/18 1156 02/01/18 0502 02/02/18 0618  NA 138 134* 135  K 3.6 3.4* 3.9  CL 102 96* 96*  CO2 25 24 28   GLUCOSE 255* 250* 220*  BUN 11 13 17   CREATININE 0.89 0.97 0.99  CALCIUM 9.5 9.3 9.9  GFRNONAA >60 >60 >60  GFRAA >60 >60 >60  ANIONGAP 11 14 11      Hematology Recent Labs  Lab 01/31/18 1156  WBC 5.1  RBC 5.41*  HGB 16.1*  HCT 48.1*  MCV 88.9  MCH 29.8  MCHC 33.5  RDW 12.6  PLT 210    Cardiac Enzymes Recent Labs  Lab 01/31/18 1845 02/01/18 0007 02/01/18 0502  TROPONINI <0.03 <0.03 <0.03    Recent Labs  Lab 01/31/18 1210  TROPIPOC 0.00     BNPNo results for input(s): BNP,  PROBNP in the last 168 hours.   DDimer No results for input(s): DDIMER in the last 168 hours.   Radiology    Dg Chest 2 View  Result Date: 01/31/2018 CLINICAL DATA:  Chest pain. EXAM: CHEST - 2 VIEW COMPARISON:  None. FINDINGS: The heart size and mediastinal contours are within normal limits. Both lungs are clear. No pneumothorax or pleural effusion is noted. The visualized skeletal structures are unremarkable. IMPRESSION: No active cardiopulmonary disease. Electronically Signed   By: Lupita Raider, M.D.   On: 01/31/2018 12:27    Cardiac Studies   Coronary CT-A pending  Echo pending  Patient Profile     Diana Fleming is a 89F with hypertension, hyperlipidemia and DM here with chest pain and L arm numbness in the setting of hypertensive urgency.  Assessment & Plan    # Exertional CP:  Coronary CT-A pending.  Symptoms improved with blood pressure control.  I suspect this is due to hypertensive urgency.  However,  given her risk factors including poorly controlled hypertension, diabetes, and hyperlipidemia she will get a coronary CT-8 today.    # Hypertensive urgency: Blood pressure much better with amlodipine, hydrochlorothiazide, and losartan.  Consolidate to Hyzaar prior to discharge.   # Pure hypercholesterolemia: Switch simvastatin to atorvastatin, especially given that she will be on amlodipine.    # Hypothyroidism:  TSH very elevated after being of levothyroxine.  Home dose has been restarted.   For questions or updates, please contact [facility] Please consult [email or web address] for contact info under Cardiology/STEMI.      Signed, Chilton Si, MD  02/02/2018, 9:59 AM

## 2018-02-02 NOTE — Progress Notes (Signed)
Subjective: She feels well this morning. Chest and arm pain have resolved. Nausea has resolved. No dyspnea or light-headedness. Feels "back to normal."  Objective:  Vital signs in last 24 hours: Vitals:   02/02/18 0521 02/02/18 0521 02/02/18 1402 02/02/18 1411  BP: (!) 145/100 130/87 (!) 147/94 (!) 141/91  Pulse: 77 77 67 65  Resp: 18     Temp: 97.8 F (36.6 C)     TempSrc: Oral     SpO2: 95% 98%    Weight:      Height:       Weight change:   Intake/Output Summary (Last 24 hours) at 02/02/2018 1438 Last data filed at 02/02/2018 0930 Gross per 24 hour  Intake 0 ml  Output -  Net 0 ml   GENERAL: Well-appearing woman in no acute distress sitting on side of bed. Pleasant, conversational, and does not appear to be uncomfortable. CARDIOVASCULAR: Extremities are warm and well-perfused. Radial and dorsalis pedis pulses 2+. Rate and rhythm are regular, with normal S1 and S2. There are no murmurs, rubs, or gallops. Mild chest wall tenderness has resolved. Jugular venous pulse is approximately 1 cm above the clavicle. PULMONARY: Normal work of breathing. Lungs are clear to auscultation bilaterally, with no wheezes, rales, or ronchi NEUROLOGIC: Orientation not formally assessed but grossly intact. Mental status normal. C ABDOMINAL: Non-tender, non-distended, and mildly obese. Mild tenderness to palpation in suprapubic region has resolved. There are no appreciable masses or organomegaly. Bowel sounds are normoactive.  Assessment/Plan:  Active Problems:   Hypertensive urgency   Chest pain   DM2 (diabetes mellitus, type 2) (HCC)   Hypothyroidism  Chest pain New-onset left sided chest pain radiating to the left arm, accompanied by T wave inversions in the inferior and lateral leads is concerning for unstable angina vs non-ST elevation myocardial infarction, with negative serial troponins pointing towards unstable angina. However, coronary CT angiogram was normal. She does have multiple risk  factors for coronary disease, including dyslipidemia, hypertension, diabetes mellitus type 2, and thyroid dysfunction. Pulmonary embolism seems less likely given her PERC score of 0. GERD remains on the differential, given her history of intermittent GERD, although this seems less likely due to the radiation of pain to her left arm and the patient's characterization that this pain feels "completely different." Musculoskeletal etiologies remain on the differential, given the tenderness to palpation over her chest wall on presentation. Cardiology decided to proceed with an initial workup as an inpatient, with plans to continue cardiac workup as an outpatient as long as she does not have any dangerous findings. Transthoracic echocardiogram was essentially normal. - Nitroglycerin PRN for chest pain - Cardiology will likely proceed with further workup as an outpatient  Hypertensive urgency Presented with blood pressure of 199/134. Decreased to 166/96 with IV labetalol. Decreased to 130/87 with resumption of home medications and addition of amlodipine. - Losartan 100 mg QD - Hydrochlorothiazide 25 mg QD - Amlodipine 10 mg QD - IV Hydralazine PRN for systolic blood pressure >180  Dyslipidemia Lipid panel shows total cholesterol of 280, high density lipoprotein of 56, low density lipoprotein of 180, and triglycerides of 221. - Switch from simvastatin 40 mg QD to rosuvastatin 40 mg QD  Vulvovaginal herpes simplex dermatitis - Valtrex 500 BID  Diabetes mellitus type 2 A1c of 8.5% in the setting of medication non-adherence. She takes metformin 500 BID at home. - Start lantus 3 units daily - Resume metformin XR, increase to 1000 mg BID - Titrate medication regimen as  an outpatient  Hypothyroid She describes going through a radiation procedure for something that "they thought might have been cancer." TSH was 21.3 in the setting of recent unavailability of her home medication regimen. - Synthroid  175 mg QD  Hypokalemia (resolved) Potassium of 3.4 in the setting of poor PO intake yesterday. Improved to 3.9 with 60 mEq of PO potassium chloride.  DVT prophylaxis: Lovenox Dispo: Admit patient to Inpatient with expected length of stay greater than 2 midnights. Telemetry status. Possible discharge 6/5 pending CT angiogram results Code status: Full code   LOS: 2 days   Novella RobKing, William W, Medical Student 02/02/2018, 2:38 PM

## 2018-02-02 NOTE — Progress Notes (Signed)
   Echo is normal and Cardiac CTA shows no plague or stenosis and calcium score of 0. She will need to focus on blood pressure control. She is OK for discharge from a cardiac perspective. A follow up has been scheduled in our office.  Berton BonJanine Dezarae Mcclaran, AGNP-C [facility] 02/02/2018  3:55 PM Pager: [phone]

## 2018-02-02 NOTE — Progress Notes (Signed)
Inpatient Diabetes Program Recommendations  AACE/ADA: New Consensus Statement on Inpatient Glycemic Control (2015)  Target Ranges:  Prepandial:   less than 140 mg/dL      Peak postprandial:   less than 180 mg/dL (1-2 hours)      Critically ill patients:  140 - 180 mg/dL   Lab Results  Component Value Date   GLUCAP 200 (H) 02/02/2018   HGBA1C 8.5 (H) 01/31/2018    Review of Glycemic ControlResults for Mickey FarberGREGORY, Diana W (MRN [id number]) as of 02/02/2018 13:52  Ref. Range 02/01/2018 17:25 02/01/2018 19:35 02/01/2018 21:45 02/02/2018 08:21 02/02/2018 12:05  Glucose-Capillary Latest Ref Range: 65 - 99 mg/dL 409195 (H) 811167 (H) 914177 (H) 227 (H) 200 (H)   Diabetes history: Type 2 DM  Outpatient Diabetes medications: Metformin 1000 mg bid Current orders for Inpatient glycemic control:  Lantus 3 units daily Inpatient Diabetes Program Recommendations:    Please consider increasing Lantus to 20 units daily.  Also please add Novolog moderate correction tid with meals and HS.  Will follow.   Thanks,  Beryl MeagerJenny Zaiya Annunziato, RN, BC-ADM Inpatient Diabetes Coordinator Pager [phone] (8a-5p)

## 2018-02-02 NOTE — Progress Notes (Signed)
  Echocardiogram 2D Echocardiogram has been performed.  Diana SavoyCasey N Aldean Fleming 02/02/2018, 11:31 AM

## 2018-02-09 ENCOUNTER — Ambulatory Visit: Payer: Self-pay | Attending: Family Medicine | Admitting: Pharmacist

## 2018-02-09 ENCOUNTER — Encounter: Payer: Self-pay | Admitting: Pharmacist

## 2018-02-09 ENCOUNTER — Ambulatory Visit: Payer: Self-pay | Attending: Family Medicine | Admitting: Physician Assistant

## 2018-02-09 VITALS — BP 131/81 | HR 99 | Temp 98.6°F | Resp 18 | Ht 73.0 in | Wt 216.0 lb

## 2018-02-09 DIAGNOSIS — Z9114 Patient's other noncompliance with medication regimen: Secondary | ICD-10-CM

## 2018-02-09 DIAGNOSIS — Z7984 Long term (current) use of oral hypoglycemic drugs: Secondary | ICD-10-CM | POA: Insufficient documentation

## 2018-02-09 DIAGNOSIS — E1165 Type 2 diabetes mellitus with hyperglycemia: Secondary | ICD-10-CM | POA: Insufficient documentation

## 2018-02-09 DIAGNOSIS — Z79899 Other long term (current) drug therapy: Secondary | ICD-10-CM | POA: Insufficient documentation

## 2018-02-09 DIAGNOSIS — I160 Hypertensive urgency: Secondary | ICD-10-CM | POA: Insufficient documentation

## 2018-02-09 DIAGNOSIS — E785 Hyperlipidemia, unspecified: Secondary | ICD-10-CM | POA: Insufficient documentation

## 2018-02-09 DIAGNOSIS — E039 Hypothyroidism, unspecified: Secondary | ICD-10-CM | POA: Insufficient documentation

## 2018-02-09 DIAGNOSIS — Z9119 Patient's noncompliance with other medical treatment and regimen: Secondary | ICD-10-CM | POA: Insufficient documentation

## 2018-02-09 DIAGNOSIS — Z09 Encounter for follow-up examination after completed treatment for conditions other than malignant neoplasm: Secondary | ICD-10-CM

## 2018-02-09 LAB — GLUCOSE, POCT (MANUAL RESULT ENTRY)
POC Glucose: 321 mg/dl — AB (ref 70–99)
POC Glucose: 267 mg/dl — AB (ref 70–99)

## 2018-02-09 MED ORDER — ATORVASTATIN CALCIUM 40 MG PO TABS: 40.0000 mg | ORAL_TABLET | Freq: Every day | ORAL | 1 refills | Status: DC

## 2018-02-09 MED ORDER — METFORMIN HCL 1000 MG PO TABS: 1000.0000 mg | ORAL_TABLET | Freq: Two times a day (BID) | ORAL | 3 refills | Status: DC

## 2018-02-09 MED ORDER — INSULIN ASPART 100 UNIT/ML ~~LOC~~ SOLN
20.0000 [IU] | Freq: Once | SUBCUTANEOUS | Status: AC
  Administered 2018-02-09: 20 [IU] via SUBCUTANEOUS

## 2018-02-09 MED ORDER — TRUEPLUS LANCETS 28G MISC: 1 refills | Status: DC

## 2018-02-09 MED ORDER — SYNTHROID 175 MCG PO TABS: 175.0000 ug | ORAL_TABLET | Freq: Every day | ORAL | 1 refills | Status: DC

## 2018-02-09 MED ORDER — TRUE METRIX METER W/DEVICE KIT: PACK | 0 refills | Status: DC

## 2018-02-09 MED ORDER — LOSARTAN POTASSIUM-HCTZ 100-25 MG PO TABS: 1.0000 | ORAL_TABLET | Freq: Every day | ORAL | 1 refills | Status: DC

## 2018-02-09 MED ORDER — AMLODIPINE BESYLATE 10 MG PO TABS: 10.0000 mg | ORAL_TABLET | Freq: Every day | ORAL | 3 refills | Status: DC

## 2018-02-09 MED ORDER — GLUCOSE BLOOD VI STRP
ORAL_STRIP | 12 refills | Status: DC
Start: 2018-02-09 — End: 2018-05-11

## 2018-02-09 MED FILL — !TRUE METRIX BLOOD GLUCOSE: 30 days supply | Qty: 1 | Fill #0

## 2018-02-09 MED FILL — TRUEplus LANCETS 28G MISC: 30 days supply | Qty: 100 | Fill #0

## 2018-02-09 MED FILL — TRUE METRIX TEST STRIP: 30 days supply | Qty: 100 | Fill #0

## 2018-02-09 NOTE — Patient Instructions (Addendum)
Check blood sugars fating and with meals and record and bring to your next visit.  Eliminate sugar and white carbohydrates from your diet.  Also drink 60-80 ounces of water daily.     Diabetes Mellitus and Nutrition When you have diabetes (diabetes mellitus), it is very important to have healthy eating habits because your blood sugar (glucose) levels are greatly affected by what you eat and drink. Eating healthy foods in the appropriate amounts, at about the same times every day, can help you:  Control your blood glucose.  Lower your risk of heart disease.  Improve your blood pressure.  Reach or maintain a healthy weight.  Every person with diabetes is different, and each person has different needs for a meal plan. Your health care provider may recommend that you work with a diet and nutrition specialist (dietitian) to make a meal plan that is best for you. Your meal plan may vary depending on factors such as:  The calories you need.  The medicines you take.  Your weight.  Your blood glucose, blood pressure, and cholesterol levels.  Your activity level.  Other health conditions you have, such as heart or kidney disease.  How do carbohydrates affect me? Carbohydrates affect your blood glucose level more than any other type of food. Eating carbohydrates naturally increases the amount of glucose in your blood. Carbohydrate counting is a method for keeping track of how many carbohydrates you eat. Counting carbohydrates is important to keep your blood glucose at a healthy level, especially if you use insulin or take certain oral diabetes medicines. It is important to know how many carbohydrates you can safely have in each meal. This is different for every person. Your dietitian can help you calculate how many carbohydrates you should have at each meal and for snack. Foods that contain carbohydrates include:  Bread, cereal, rice, pasta, and crackers.  Potatoes and corn.  Peas, beans,  and lentils.  Milk and yogurt.  Fruit and juice.  Desserts, such as cakes, cookies, ice cream, and candy.  How does alcohol affect me? Alcohol can cause a sudden decrease in blood glucose (hypoglycemia), especially if you use insulin or take certain oral diabetes medicines. Hypoglycemia can be a life-threatening condition. Symptoms of hypoglycemia (sleepiness, dizziness, and confusion) are similar to symptoms of having too much alcohol. If your health care provider says that alcohol is safe for you, follow these guidelines:  Limit alcohol intake to no more than 1 drink per day for nonpregnant women and 2 drinks per day for men. One drink equals 12 oz of beer, 5 oz of wine, or 1 oz of hard liquor.  Do not drink on an empty stomach.  Keep yourself hydrated with water, diet soda, or unsweetened iced tea.  Keep in mind that regular soda, juice, and other mixers may contain a lot of sugar and must be counted as carbohydrates.  What are tips for following this plan? Reading food labels  Start by checking the serving size on the label. The amount of calories, carbohydrates, fats, and other nutrients listed on the label are based on one serving of the food. Many foods contain more than one serving per package.  Check the total grams (g) of carbohydrates in one serving. You can calculate the number of servings of carbohydrates in one serving by dividing the total carbohydrates by 15. For example, if a food has 30 g of total carbohydrates, it would be equal to 2 servings of carbohydrates.  Check the number  of grams (g) of saturated and trans fats in one serving. Choose foods that have low or no amount of these fats.  Check the number of milligrams (mg) of sodium in one serving. Most people should limit total sodium intake to less than 2,300 mg per day.  Always check the nutrition information of foods labeled as "low-fat" or "nonfat". These foods may be higher in added sugar or refined  carbohydrates and should be avoided.  Talk to your dietitian to identify your daily goals for nutrients listed on the label. Shopping  Avoid buying canned, premade, or processed foods. These foods tend to be high in fat, sodium, and added sugar.  Shop around the outside edge of the grocery store. This includes fresh fruits and vegetables, bulk grains, fresh meats, and fresh dairy. Cooking  Use low-heat cooking methods, such as baking, instead of high-heat cooking methods like deep frying.  Cook using healthy oils, such as olive, canola, or sunflower oil.  Avoid cooking with butter, cream, or high-fat meats. Meal planning  Eat meals and snacks regularly, preferably at the same times every day. Avoid going long periods of time without eating.  Eat foods high in fiber, such as fresh fruits, vegetables, beans, and whole grains. Talk to your dietitian about how many servings of carbohydrates you can eat at each meal.  Eat 4-6 ounces of lean protein each day, such as lean meat, chicken, fish, eggs, or tofu. 1 ounce is equal to 1 ounce of meat, chicken, or fish, 1 egg, or 1/4 cup of tofu.  Eat some foods each day that contain healthy fats, such as avocado, nuts, seeds, and fish. Lifestyle   Check your blood glucose regularly.  Exercise at least 30 minutes 5 or more days each week, or as told by your health care provider.  Take medicines as told by your health care provider.  Do not use any products that contain nicotine or tobacco, such as cigarettes and e-cigarettes. If you need help quitting, ask your health care provider.  Work with a Veterinary surgeon or diabetes educator to identify strategies to manage stress and any emotional and social challenges. What are some questions to ask my health care provider?  Do I need to meet with a diabetes educator?  Do I need to meet with a dietitian?  What number can I call if I have questions?  When are the best times to check my blood  glucose? Where to find more information:  American Diabetes Association: diabetes.org/food-and-fitness/food  Academy of Nutrition and Dietetics: https://www.vargas.com/  [employer] of Diabetes and Digestive and Kidney Diseases (NIH): FindJewelers.cz Summary  A healthy meal plan will help you control your blood glucose and maintain a healthy lifestyle.  Working with a diet and nutrition specialist (dietitian) can help you make a meal plan that is best for you.  Keep in mind that carbohydrates and alcohol have immediate effects on your blood glucose levels. It is important to count carbohydrates and to use alcohol carefully. This information is not intended to replace advice given to you by your health care provider. Make sure you discuss any questions you have with your health care provider. Document Released: 05/14/2005 Document Revised: 09/21/2016 Document Reviewed: 09/21/2016 Elsevier Interactive Patient Education  [employer].

## 2018-02-09 NOTE — Progress Notes (Signed)
    S:    Patient arrives in good spirits. Was referred today by Diana Fleming for glucometer teaching. Will establish care with Diana Fleming on 03/16/18.   Patient has no complaints. [facility] and states that she is unsure how to use glucometer.   Current DM medications:  -metformin 1000mg  BID -True Metrix glucometer and supplies    O:  No objective measures taken during this visit.   A/P: She has been provided with lancets, testing strips, and meter. Patient states that before today's visit, she had llittle understanding of the importance of checking home sugars. Does report that this was emphasized in her doctor's visit. Reinforced the importance of taking home blood sugars. Discussed when to take and how often. Glucometer technique covered in detail multiple times with teach-back method. Patient states she now feels comfortable and knows to call me/pharmacy with any glucometer errors or questions about technique.   -Glucometer and supplies picked up. Confirmed working and had patient check sugar level in clinic.  -Technique reviewed extensively with teach-back  -Emphasized to try and test every day at least fasting but try to test fasting and with meals  -Reviewed hypoglycemia and emphasized need for testing should she feel symptoms of hypoglycemia -Pt verbalizes understanding and knows to contact us with questions   Written patient instructions provided.  Total time in face to face counseling 15 minutes.   Follow up PCP Clinic Visit 03/16/18.   Patient seen with Candelaria CelesteKatrina Harris, PharmD Candidate 2022  Butch PennyLuke Van Ausdall, PharmD, CPP Clinical Pharmacist Practitioner [facility] [phone]

## 2018-02-09 NOTE — Progress Notes (Signed)
Patient ID: Diana Fleming, female   DOB: 1966/07/27, 52 y.o.   MRN: [id number]      Diana Fleming, is a 52 y.o. female  [id number]  [id number]  DOB - 03-11-1966  Subjective:  Chief Complaint and HPI: Diana Fleming is a 52 y.o. female here today to establish care and for a follow up visit After being hospitalized for CP, hypertensive urgency, and uncontrolled DM  6/3-02/02/2018:  Today she presents feeling well.  She does not have a glucometer at home and has only been taking 541m metformin bid.  BP is much better and readings at home <140/90.  No problems or complaints.  Ate oat meal with sugar on it for breakfast.  Nor further CP.  No HA/dizziness  From hospital d/c summary:  Diana Fleming a 52year old woman with multiple cardiac risk factors including dyslipidemia, hypertension, diabetes mellitus type 2, and thyroid dysfunction who was admitted due to chest pain.  Chest pain The etiology of her chest pain remained unclear. See discussion of differential diagnosis below. She presented with left-sided chest pain radiating to her substernal midline and left arm. The chst pain came on at rest, and had no alleviating or exacerbating factors. Blood pressure on presentation to the emergency department was 199/34. Electrocardiogram that time revealed T wave inversions in the inferior and lateral leads, as well as poor R wave progression across the anterior leads. Both of these findings were new compared with prior electrocardiogram from 2010. Cardiology was consulted due to concern for acute coronary syndrome. Transthoracic echocardiogram revealed a left ventricular ejection fraction of 60 to 65%, mild left ventricular hypertrophy, normal wall motion, grade 1 diastolic dysfunction, indeterminate left atrial filling pressure, aortic valve sclerosis, normal left atrial size, trivial tricuspid regurgitation, a right ventricular systolic pressure of 17 mmHg, and a normal inferior vena cava.  All of these findings would be expected in someone of her age with longstanding hypertension. Coronary CT angiogram was entirely normal, with a coronary calcium score of zero, suggesting a low risk for future coronary events. Cardiology plans to follow up as an outpatient.  The differential diagnosis for her chest pain remains broad, but the potentially dangerous etiologies have all been essentially ruled out. New-onset left sided chest pain radiating to the left arm, accompanied by T wave inversions in the inferior and lateral leads was concerning for ACS. Negative serial troponins was suggestive of unstable angina. She does have multiple risk factors for coronary disease, including dyslipidemia, hypertension, diabetes mellitus type 2, and thyroid dysfunction. However, coronary CT angiogram was normal, pointing away from unstable angina. Transthoracic echocardiogram was essentially normal. Pulmonary embolism seemsless likelygiven her PERC score of 0. GERD remains on the differential, given her history of intermittent GERD, although this seems less likely due to the radiation of pain to her left arm and the patient's characterization that this pain feels "completely different." Musculoskeletal etiologies remain on the differential, given the tenderness to palpation over her chest wall on presentation. Vasospastic (formerly known as Prinzmental) angina remains on the differential as well, although this is rare.  Hypertensive urgency She presented with a blood pressure of 199/134 in the setting of recent medication non-compliance. She had not had access to medication in at least 30 days. Her blood pressure decreased to 166/96 with IV labetalol. It further decreased to 130/87 with resumption of home medications and addition of amlodipine.  Dyslipidemia Lipid panel shows total cholesterol of 280, high density lipoprotein of 56, low density lipoprotein of  180, and triglycerides of 221. She was switched from  simvastatin 40 mg QD, which she had been taking previously,to rosuvastatin 40 mg QD.  Diabetes mellitus type 2 A1c was 8.5% on presentation in the setting of recent medication on-adherence. She had been taking metformin 500 BID at home. Metformin was initially held due to potential contrast load, and she was given Lantus 3 units QHS while inpatient. She is being discharge don metformin XR 1000 mg BID. She will likely require further titration of antihyperglycemics as an outpatient.  Hypothyroidism She describes going through a radiation procedure for something that "they thought might have been cancer." It is not clear what this procedure was. TSH was 21.3 in the setting of recent unavailability of her home medication regimen. Synthroid 175 mcg QD was resumed.  Hypokalemia(resolved) Potassium of 3.4 in the setting of poor PO intake yesterday.Improved to 3.9 with 60 mEq of PO potassium chloride.  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  Discharge Vitals:   BP (!) 141/91   Pulse 65   Temp 97.8 F (36.6 C) (Oral)   Resp 18   Ht 6' 1"  (1.854 m)   Wt 96.5 kg (212 lb 11.9 oz)   LMP 12/05/2012   SpO2 98%   BMI 28.07 kg/m   Pertinent Labs, Studies, and Procedures:  Transthoracic echocardiogram revealed a left ventricular ejection fraction of 60 to 65%, mild left ventricular hypertrophy, normal wall motion, grade 1 diastolic dysfunction, indeterminate left atrial filling pressure, aortic valve sclerosis, normal left atrial size, trivial tricuspid regurgitation, a right ventricular systolic pressure of 17 mmHg, and a normal inferior vena cava. All of these findings would be expected in someone of her age with longstanding hypertension. Coronary CT angiogram was entirely normal, with a coronary calcium score of zero.   1.  Address antihyperglycemic regimen. She is being discharged on metformin XR 1000 mg BID. Her A1c is 8.5%. She will likely requrie addition  of an additional agent as an outpatient.  2.  Labs / imaging needed at time of follow-up: Repeat TSH in 6 weeks. Recently re-started on Synthroid 175 mcg QD after not having access to medications for over a month.  3.  Pending labs/ test needing follow-up: None  ED/Hospital notes reviewed and summarized above.    ROS:   Constitutional:  No f/c, No night sweats, No unexplained weight loss. EENT:  No vision changes, No blurry vision, No hearing changes. No mouth, throat, or ear problems.  Respiratory: No cough, No SOB Cardiac: No CP, no palpitations GI:  No abd pain, No N/V/D. GU: No Urinary s/sx Musculoskeletal: No joint pain Neuro: No headache, no dizziness, no motor weakness.  Skin: No rash Endocrine:  No polydipsia. No polyuria.  Psych: Denies SI/HI  No problems updated.  ALLERGIES: No Known Allergies  PAST MEDICAL HISTORY: Past Medical History:  Diagnosis Date  . Diabetes mellitus, type 2 ([place])   . Dyslipidemia   . 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 Hypertension   . Hypothyroidism     MEDICATIONS AT HOME: Prior to Admission medications   Medication Sig Start Date End Date Taking? Authorizing Provider  amLODipine (NORVASC) 10 MG tablet Take 1 tablet (10 mg total) by mouth daily. 02/09/18  Yes Argentina Donovan, PA-C  atorvastatin (LIPITOR) 40 MG tablet Take 1 tablet (40 mg total) by mouth daily at 6 PM. 02/09/18  Yes McClung, Dionne Bucy, PA-C  losartan-hydrochlorothiazide (HYZAAR) 100-25 MG tablet Take 1 tablet  by mouth daily. 02/09/18  Yes McClung, Dionne Bucy, PA-C  SYNTHROID 175 MCG tablet Take 1 tablet (175 mcg total) by mouth daily. 02/09/18  Yes McClung, Dionne Bucy, PA-C  valACYclovir (VALTREX) 500 MG tablet Take 500 mg by mouth 2 (two) times daily. For 3 days for outbreaks.   Yes [provider]  Blood Glucose Monitoring Suppl (TRUE METRIX METER) w/Device KIT Check blood sugars as directed 02/09/18   Argentina Donovan, PA-C  glucose blood (TRUE  METRIX BLOOD GLUCOSE TEST) test strip Use as instructed 02/09/18   Argentina Donovan, PA-C  metFORMIN (GLUCOPHAGE) 1000 MG tablet Take 1 tablet (1,000 mg total) by mouth 2 (two) times daily with a meal. 02/09/18   McClung, Dionne Bucy, PA-C  TRUEPLUS LANCETS 28G MISC Check blood sugar fasting and at meals 02/09/18   Argentina Donovan, PA-C     Objective:  EXAM:   Vitals:   02/09/18 1057  BP: 131/81  Pulse: 99  Resp: 18  Temp: 98.6 F (37 C)  TempSrc: Oral  SpO2: 97%  Weight: 216 lb (98 kg)  Height: 6' 1"  (1.854 m)    General appearance : A&OX3. NAD. Non-toxic-appearing HEENT: Atraumatic and Normocephalic.  PERRLA. EOM intact.   Neck: supple, no JVD. No cervical lymphadenopathy. No thyromegaly Chest/Lungs:  Breathing-non-labored, Good air entry bilaterally, breath sounds normal without rales, rhonchi, or wheezing  CVS: S1 S2 regular, no murmurs, gallops, rubs  Extremities: Bilateral Lower Ext shows no edema, both legs are warm to touch with = pulse throughout Neurology:  CN II-XII grossly intact, Non focal.   Psych:  TP linear. J/I WNL. Normal speech. Appropriate eye contact and affect.  Skin:  No Rash  Data Review Lab Results  Component Value Date   HGBA1C 8.5 (H) 01/31/2018     Assessment & Plan   1. Type 2 diabetes mellitus with hyperglycemia, unspecified whether long term insulin use (HCC) Uncontrolled.  Will order her meter - Glucose (CBG) - insulin aspart (novoLOG) injection 20 Units - Blood Glucose Monitoring Suppl (TRUE METRIX METER) w/Device KIT; Check blood sugars as directed  Dispense: 1 kit; Refill: 0 - glucose blood (TRUE METRIX BLOOD GLUCOSE TEST) test strip; Use as instructed  Dispense: 100 each; Refill: 12 - TRUEPLUS LANCETS 28G MISC; Check blood sugar fasting and at meals  Dispense: 100 each; Refill: 1 Increase dose of metformin to 1066m bid, new Rx sent.   Check blood sugars fating and with meals and record and bring to your next visit.  Eliminate sugar  and white carbohydrates from your diet.  Also drink 60-80 ounces of water daily.  I gave her 20 ounces of water po along with the novolog she was given in office.    2. Hypertensive urgency Controlled.  Continue current regimen - amLODipine (NORVASC) 10 MG tablet; Take 1 tablet (10 mg total) by mouth daily.  Dispense: 30 tablet; Refill: 3 - losartan-hydrochlorothiazide (HYZAAR) 100-25 MG tablet; Take 1 tablet by mouth daily.  Dispense: 30 tablet; Refill: 1  3. Hospital discharge follow-up htn improving; feels better/no CP  4. Acquired hypothyroidism Will check level in a few weeks bc just resumed medications - SYNTHROID 175 MCG tablet; Take 1 tablet (175 mcg total) by mouth daily.  Dispense: 30 tablet; Refill: 1  5. Hyperlipidemia, unspecified hyperlipidemia type - atorvastatin (LIPITOR) 40 MG tablet; Take 1 tablet (40 mg total) by mouth daily at 6 PM.  Dispense: 30 tablet; Refill: 1  6. H/o non-compliance Discussed compliance and healthy  diet at length   Patient have been counseled extensively about nutrition and exercise  Return in about 5 years (around 02/10/2023) for assign PCP; check TSH, evaluate DM.  The patient was given clear instructions to go to ER or return to medical center if symptoms don't improve, worsen or new problems develop. The patient verbalized understanding. The patient was told to call to get lab results if they haven't heard anything in the next week.     Freeman Caldron, PA-C [facility] [place], [place]   02/09/2018, 11:08 AM

## 2018-02-23 ENCOUNTER — Encounter: Payer: Self-pay | Admitting: Physician Assistant

## 2018-02-23 ENCOUNTER — Ambulatory Visit (INDEPENDENT_AMBULATORY_CARE_PROVIDER_SITE_OTHER): Payer: Self-pay | Admitting: Physician Assistant

## 2018-02-23 VITALS — BP 134/86 | HR 92 | Ht 73.0 in | Wt 213.0 lb

## 2018-02-23 DIAGNOSIS — I10 Essential (primary) hypertension: Secondary | ICD-10-CM

## 2018-02-23 DIAGNOSIS — R0789 Other chest pain: Secondary | ICD-10-CM

## 2018-02-23 NOTE — Progress Notes (Signed)
Cardiology Office Note   Date:  02/23/2018   ID:  Diana Fleming, Diana Fleming 1966-06-09, MRN [id number]  PCP:  Argentina Donovan, PA-C  Cardiologist: Dr. Oval Linsey, 02/01/2018 in hospital Rosaria Ferries, PA-C    History of Present Illness: DALANEY NEEDLE is a 52 y.o. female with a history of DM, HTN, HLD, hypothyroid, HSV vag  Admitted 6/3-02/02/2018 for chest pain, hypertensive urgency, meds restarted, echo with aortic valve sclerosis and grade 1 diastolic dysfunction, cardiac CT normal  Mardi Mainland presents for cardiology follow up.  She has had some chest pain but it did not last.  It was brief, not exertional.  She is not concerned about it.  She has not woken up w/ LE edema. Gets a little during the day. No new DOE. No orthopnea or PND.   Happy to learn that the cardiac CT was ok.   Unaware of the murmur, relieved to learn it is nothing.   She is checking her BP daily, varies the times. Is a little concerned because BP is higher in the morning, but not too bad, 140s/90s.  She was reminded that the early a.m. blood pressures were before she takes her medications, she relieved to hear this.  She had been worried that her blood pressure cuff was off.   Past Medical History:  Diagnosis Date  . Diabetes mellitus, type 2 ([place])   . Dyslipidemia   . 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 Hypertension   . Hypothyroidism     Past Surgical History:  Procedure Laterality Date  . DILATION AND CURETTAGE, DIAGNOSTIC / THERAPEUTIC  2002  . Radioiodine ablation      Current Outpatient Medications  Medication Sig Dispense Refill  . amLODipine (NORVASC) 10 MG tablet Take 1 tablet (10 mg total) by mouth daily. 30 tablet 3  . atorvastatin (LIPITOR) 40 MG tablet Take 1 tablet (40 mg total) by mouth daily at 6 PM. 30 tablet 1  . losartan-hydrochlorothiazide (HYZAAR) 100-25 MG tablet Take 1 tablet by mouth daily. 30 tablet 1  . metFORMIN (GLUCOPHAGE) 1000 MG tablet Take 1  tablet (1,000 mg total) by mouth 2 (two) times daily with a meal. 180 tablet 3  . SYNTHROID 175 MCG tablet Take 1 tablet (175 mcg total) by mouth daily. 30 tablet 1  . Blood Glucose Monitoring Suppl (TRUE METRIX METER) w/Device KIT Check blood sugars as directed 1 kit 0  . glucose blood (TRUE METRIX BLOOD GLUCOSE TEST) test strip Use as instructed 100 each 12  . TRUEPLUS LANCETS 28G MISC Check blood sugar fasting and at meals 100 each 1  . valACYclovir (VALTREX) 500 MG tablet Take 500 mg by mouth 2 (two) times daily. For 3 days for outbreaks.     No current facility-administered medications for this visit.     Allergies:   Patient has no known allergies.    Social History:  The patient  reports that she has never smoked. She has never used smokeless tobacco. She reports that she does not drink alcohol or use drugs.   Family History:  The patient's family history includes Cancer in her mother; Heart attack in her maternal grandfather and maternal grandmother.    ROS:  Please see the history of present illness. All other systems are reviewed and negative.    PHYSICAL EXAM: VS:  BP 134/86   Pulse 92   Ht 6' 1"  (1.854 m)   Wt 213 lb (96.6 kg)  LMP 12/05/2012   BMI 28.10 kg/m  , BMI Body mass index is 28.1 kg/m. GEN: Well nourished, well developed, female in no acute distress  HEENT: normal for age  Neck: no JVD, no carotid bruit, no masses Cardiac: RRR; soft murmur, no rubs, or gallops Respiratory:  clear to auscultation bilaterally, normal work of breathing GI: soft, nontender, nondistended, + BS MS: no deformity or atrophy; no edema; distal pulses are 2+ in all 4 extremities   Skin: warm and dry, no rash Neuro:  Strength and sensation are intact Psych: euthymic mood, full affect   EKG:  EKG is not ordered today.  ECHO: 02/01/2018 - Left ventricle: The cavity size was normal. Wall thickness was   increased in a pattern of mild LVH. Systolic function was normal.   The  estimated ejection fraction was in the range of 60% to 65%.   Doppler parameters are consistent with abnormal left ventricular   relaxation (grade 1 diastolic dysfunction). The E/e&' ratio is   between 8-15, suggesting indeterminate LV filling pressure. - Aortic valve: Sclerosis without stenosis. There was no   regurgitation. - Left atrium: The atrium was normal in size. - Tricuspid valve: There was trivial regurgitation. - Pulmonary arteries: PA peak pressure: 17 mm Hg (S). - Inferior vena cava: The vessel was normal in size. The   respirophasic diameter changes were in the normal range (>= 50%),   consistent with normal central venous pressure.  Impressions:  - LVEF 60-65%, mild LVH, normal wall motion, grade 1 DD,   indeterminate LV filling pressure, aortic valve sclerosis, normal   LA size, trivial TR, RVSP 17 mmHg, normal IVC.  Dg Chest 2 View  Result Date: 01/31/2018 CLINICAL DATA:  Chest pain. EXAM: CHEST - 2 VIEW COMPARISON:  None. FINDINGS: The heart size and mediastinal contours are within normal limits. Both lungs are clear. No pneumothorax or pleural effusion is noted. The visualized skeletal structures are unremarkable. IMPRESSION: No active cardiopulmonary disease. Electronically Signed   By: Marijo Conception, M.D.   On: 01/31/2018 12:27   Ct Coronary Morph W/cta Cor W/score W/ca W/cm &/or Wo/cm  Addendum Date: 02/02/2018   ADDENDUM REPORT: 02/02/2018 16:41 EXAM: OVER-READ INTERPRETATION  CT CHEST The following report is an over-read performed by radiologist Dr. Rebekah Chesterfield [facility] on 02/02/2018. This over-read does not include interpretation of cardiac or coronary anatomy or pathology. The cardiac calcium score and coronary CTA interpretation by the cardiologist is attached. COMPARISON:  None. FINDINGS: Within the visualized portions of the thorax there are no suspicious appearing pulmonary nodules or masses, there is no acute consolidative airspace  disease, no pleural effusions, no pneumothorax and no lymphadenopathy. Visualized portions of the upper abdomen are unremarkable. There are no aggressive appearing lytic or blastic lesions noted in the visualized portions of the skeleton. IMPRESSION: 1. No significant incidental noncardiac findings are noted. Electronically Signed   By: Vinnie Langton M.D.   On: 02/02/2018 16:41   Result Date: 02/02/2018 CLINICAL DATA:  Chest pain EXAM: Cardiac CTA MEDICATIONS: Sub lingual nitro. 32m x 2 and lopressor 557mIV TECHNIQUE: The patient was scanned on a Siemens [id number] scanner. Gantry rotation speed was 250 msecs. Collimation was 0.6 mm. A 100 kV prospective scan was triggered in the ascending thoracic aorta at 35-75% of the R-R interval. Average HR during the scan was 60 bpm. The 3D data set was interpreted on a dedicated work station using MPR, MIP and VRT modes. A total of  80cc of contrast was used. FINDINGS: Non-cardiac: See separate report from [facility]. Calcium Score: 0 Agatston units. Coronary Arteries: Right dominant with no anomalies LM: No plaque or stenosis. LAD system:  No plaque or stenosis. Circumflex system: No plaque or stenosis. RCA system:  No plaque or stenosis. IMPRESSION: 1. Coronary artery calcium score 0 Agatston units, suggesting low risk for future cardiac events. 2.  No plaque or stenosis noted in the coronary tree. 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 On: 02/02/2018 14:36    Recent Labs: 01/31/2018: Hemoglobin 16.1; Platelets 210; TSH 21.281 02/02/2018: BUN 17; Creatinine, Ser 0.99; Potassium 3.9; Sodium 135    Lipid Panel    Component Value Date/Time   CHOL 280 (H) 01/31/2018 1845   TRIG 221 (H) 01/31/2018 1845   HDL 56 01/31/2018 1845   CHOLHDL 5.0 01/31/2018 1845   VLDL 44 (H) 01/31/2018 1845   LDLCALC 180 (H) 01/31/2018 1845     Wt Readings from Last 3 Encounters:  02/23/18 213 lb (96.6 kg)  02/09/18 216 lb (98 kg)  01/31/18 212 lb  11.9 oz (96.5 kg)     Other studies Reviewed: Additional studies/ records that were reviewed today include: Hospital records and testing.  ASSESSMENT AND PLAN:  1.  Chest pain: I reassured the patient that her calcium score was 0 and her arteries were clear.  She was very relieved to hear this. -She is requested to go to her PCP if she continues to get episodes of chest pain  2.  Hypertension: I emphasized to her that her chest pain may have been related to her uncontrolled hypertension and emphasized the importance of taking her medications.  She is now a patient at the [facility] clinic and is motivated to be compliant with medications and visits.   Current medicines are reviewed at length with the patient today.  The patient does not have concerns regarding medicines.  The following changes have been made:  no change  Labs/ tests ordered today include:  No orders of the defined types were placed in this encounter.    Disposition:   FU with Dr. Oval Linsey in 1 year  Signed, Rosaria Ferries, PA-C  02/23/2018 12:16 PM    [place] Phone: [phone]; Fax: [phone]  This note was written with the assistance of speech recognition software. Please excuse any transcriptional errors.

## 2018-02-23 NOTE — Patient Instructions (Signed)
Medication Instructions:  Your physician recommends that you continue on your current medications as directed. Please refer to the Current Medication list given to you today.  Follow-Up: Your physician wants you to follow-up in: 1 year with Dr. Sycamore. You will receive a reminder letter in the mail two months in advance. If you don't receive a letter, please call our office to schedule the follow-up appointment.       If you need a refill on your cardiac medications before your next appointment, please call your pharmacy.   

## 2018-03-16 ENCOUNTER — Ambulatory Visit: Payer: Self-pay | Attending: Nurse Practitioner | Admitting: Nurse Practitioner

## 2018-03-16 ENCOUNTER — Encounter: Payer: Self-pay | Admitting: Nurse Practitioner

## 2018-03-16 VITALS — BP 139/89 | HR 81 | Ht 73.0 in | Wt 209.2 lb

## 2018-03-16 DIAGNOSIS — E782 Mixed hyperlipidemia: Secondary | ICD-10-CM | POA: Insufficient documentation

## 2018-03-16 DIAGNOSIS — E039 Hypothyroidism, unspecified: Secondary | ICD-10-CM | POA: Insufficient documentation

## 2018-03-16 DIAGNOSIS — Z8349 Family history of other endocrine, nutritional and metabolic diseases: Secondary | ICD-10-CM | POA: Insufficient documentation

## 2018-03-16 DIAGNOSIS — Z79899 Other long term (current) drug therapy: Secondary | ICD-10-CM | POA: Insufficient documentation

## 2018-03-16 DIAGNOSIS — E1165 Type 2 diabetes mellitus with hyperglycemia: Secondary | ICD-10-CM | POA: Insufficient documentation

## 2018-03-16 DIAGNOSIS — Z7984 Long term (current) use of oral hypoglycemic drugs: Secondary | ICD-10-CM | POA: Insufficient documentation

## 2018-03-16 DIAGNOSIS — Z79890 Hormone replacement therapy (postmenopausal): Secondary | ICD-10-CM | POA: Insufficient documentation

## 2018-03-16 DIAGNOSIS — I10 Essential (primary) hypertension: Secondary | ICD-10-CM | POA: Insufficient documentation

## 2018-03-16 LAB — GLUCOSE, POCT (MANUAL RESULT ENTRY): POC Glucose: 228 mg/dl — AB (ref 70–99)

## 2018-03-16 NOTE — Progress Notes (Signed)
Assessment & Plan:  Diana Fleming was seen today for establish care.  Diagnoses and all orders for this visit:  Type 2 diabetes mellitus with hyperglycemia, unspecified whether long term insulin use (HCC) -     Glucose (CBG) Continue blood sugar control as discussed in office today, low carbohydrate diet, and regular physical exercise as tolerated, 150 minutes per week (30 min each day, 5 days per week, or 50 min 3 days per week). Keep blood sugar logs with fasting goal of 90-130 mg/dl, post prandial (after you eat) less than 180.  For Hypoglycemia: BS <60 and Hyperglycemia BS >400; contact the clinic ASAP. Annual eye exams and foot exams are recommended. We had a long conversation today regarding carb counting, dietary and exercise modifications. She is eager to get her health back on the right track. Writing notes in her her notebook today.    Essential hypertension Continue all antihypertensives as prescribed.  Remember to bring in your blood pressure log with you for your follow up appointment.  DASH/Mediterranean Diets are healthier choices for HTN.   Mixed hyperlipidemia INSTRUCTIONS: Work on a low fat, heart healthy diet and participate in regular aerobic exercise program by working out at least 150 minutes per week; 5 days a week-30 minutes per day. Avoid red meat, fried foods. junk foods, sodas, sugary drinks, unhealthy snacking, alcohol and smoking.  Drink at least 48oz of water per day and monitor your carbohydrate intake daily.    Hypothyroidism, unspecified type -     TSH Instructed patient to take synthroid 30 minutes prior to any other medications for better efficacy.   Patient has been counseled on age-appropriate routine health concerns for screening and prevention. These are reviewed and up-to-date. Referrals have been placed accordingly. Immunizations are up-to-date or declined.    Subjective:   Chief Complaint  Patient presents with  . Establish Care    Pt. is here to  establish care for diabetes, hypertension, and thyroid.    HPI Diana Fleming 52 y.o. female presents to office today to establish care.   She has a history of DM TYPE 2, HPL, HTN and hypothyroidism. Currently seeing Cardiology. Atypical CP (likely related to poorly controlled HTN). Recent ECHO showing Aortic Valve Sclerosis and Grade 1 Diastolic dysfunction (LVEF 60-65%). Cardiac CT normal. Follow up with Cardiology annually at this point.    Hypertension She is exercising and is adherent to low salt diet.  She does not have a blood pressure log  today.  Blood pressure is well controlled at home. She does monitor her blood pressure at home. She is medication compliant taking hyzaar 100-75m daily.  Cardiac symptoms none.  Denies chest pain, shortness of breath, palpitations, lightheadedness, dizziness, headaches or BLE edema. Use of agents associated with hypertension: thyroid hormonesHistory of target organ damage: none. BP Readings from Last 3 Encounters:  03/16/18 139/89  02/23/18 134/86  02/09/18 131/81    Hyperlipidemia Patient presents for follow up to hyperlipidemia.  She is medication compliant. She is not diet compliant and denies chest pain, exertional chest pressure/discomfort, lower extremity edema, tachypnea and skin xanthelasma or statin intolerance including myalgias. LDL not at goal.  Lab Results  Component Value Date   CHOL 280 (H) 01/31/2018   Lab Results  Component Value Date   HDL 56 01/31/2018   Lab Results  Component Value Date   LDLCALC 180 (H) 01/31/2018   Lab Results  Component Value Date   TRIG 221 (H) 01/31/2018   Lab Results  Component Value Date   CHOLHDL 5.0 01/31/2018      Diabetes Mellitus Type II Current symptoms/problems include hyperglycemia and have been unchanged.  Known diabetic complications: none Cardiovascular risk factors: diabetes mellitus, dyslipidemia and hypertension Current diabetic medications include: Metformin 1014m  BID; she reports vomiting x1 with the new metformin change and so she switched back to her previous dose. She was previously taking metformin XR 5072mwith no GI upset. I have offered to switch her back to the XR however she will need to take 2 tablets BID instead of 1. She would like to try to take the metformin 100035mgain before I may any prescription changes. She will call me to let me know if she can tolerate the 1000m58m needs to be switched back to metformin XR Eye exam current (within one year): no Weight trend: stable; she has lost 9 Prior visit with dietician: no Current monitoring regimen: 2-3 times per day Home blood sugar records: 200s Any episodes of hypoglycemia? no Is She on ACE inhibitor or angiotensin II receptor blocker?  Yes  Lab Results  Component Value Date   HGBA1C 8.5 (H) 01/31/2018    Hypothyroidism Patient presents for evaluation of thyroid function. Symptoms consist of denies fatigue, weight changes, heat/cold intolerance, bowel/skin changes or CVS symptoms.The problem has been gradually worsening.  Previous thyroid studies include TSH. The hypothyroidism is due to hypothyroidism and s/p radiation/ablation.  Lab Results  Component Value Date   TSH 21.281 (H) 01/31/2018    Review of Systems  Constitutional: Negative for fever, malaise/fatigue and weight loss.  HENT: Negative.  Negative for nosebleeds.   Eyes: Negative.  Negative for blurred vision, double vision and photophobia.  Respiratory: Negative.  Negative for cough and shortness of breath.   Cardiovascular: Negative.  Negative for chest pain, palpitations and leg swelling.  Gastrointestinal: Negative.  Negative for heartburn, nausea and vomiting.  Musculoskeletal: Negative.  Negative for myalgias.  Neurological: Negative.  Negative for dizziness, focal weakness, seizures and headaches.  Psychiatric/Behavioral: Negative.  Negative for suicidal ideas.    Past Medical History:  Diagnosis Date  .  Diabetes mellitus, type 2 (HCC)[place]. Dyslipidemia   . 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 Hypertension   . Hypothyroidism     Past Surgical History:  Procedure Laterality Date  . DILATION AND CURETTAGE, DIAGNOSTIC / THERAPEUTIC  2002  . Radioiodine ablation      Family History  Problem Relation Age of Onset  . Cancer Mother   . Thyroid disease Mother   . Heart attack Maternal Grandfather   . Heart attack Maternal Grandmother     Social History Reviewed with no changes to be made today.   Outpatient Medications Prior to Visit  Medication Sig Dispense Refill  . amLODipine (NORVASC) 10 MG tablet Take 1 tablet (10 mg total) by mouth daily. 30 tablet 3  . atorvastatin (LIPITOR) 40 MG tablet Take 1 tablet (40 mg total) by mouth daily at 6 PM. 30 tablet 1  . Blood Glucose Monitoring Suppl (TRUE METRIX METER) w/Device KIT Check blood sugars as directed 1 kit 0  . glucose blood (TRUE METRIX BLOOD GLUCOSE TEST) test strip Use as instructed 100 each 12  . losartan-hydrochlorothiazide (HYZAAR) 100-25 MG tablet Take 1 tablet by mouth daily. 30 tablet 1  . metFORMIN (GLUCOPHAGE) 1000 MG tablet Take 1 tablet (1,000 mg total) by mouth 2 (two) times daily with a meal. 180 tablet 3  . SYNTHROID  175 MCG tablet Take 1 tablet (175 mcg total) by mouth daily. 30 tablet 1  . TRUEPLUS LANCETS 28G MISC Check blood sugar fasting and at meals 100 each 1  . valACYclovir (VALTREX) 500 MG tablet Take 500 mg by mouth 2 (two) times daily. For 3 days for outbreaks.     No facility-administered medications prior to visit.     No Known Allergies     Objective:    BP 139/89 (BP Location: Left Arm, Patient Position: Sitting, Cuff Size: Large)   Pulse 81   Ht 6' 1" (1.854 m)   Wt 209 lb 3.2 oz (94.9 kg)   LMP 12/05/2012   SpO2 97%   BMI 27.60 kg/m  Wt Readings from Last 3 Encounters:  03/16/18 209 lb 3.2 oz (94.9 kg)  02/23/18 213 lb (96.6 kg)  02/09/18 216 lb (98 kg)    Physical Exam   Constitutional: She is oriented to person, place, and time. She appears well-developed and well-nourished. She is cooperative.  HENT:  Head: Normocephalic and atraumatic.  Eyes: EOM are normal.  Neck: Normal range of motion.  Cardiovascular: Normal rate, regular rhythm and normal heart sounds. Exam reveals no gallop and no friction rub.  No murmur heard. Pulmonary/Chest: Effort normal and breath sounds normal. No tachypnea. No respiratory distress. She has no decreased breath sounds. She has no wheezes. She has no rhonchi. She has no rales. She exhibits no tenderness.  Abdominal: Bowel sounds are normal.  Musculoskeletal: Normal range of motion. She exhibits no edema.  Neurological: She is alert and oriented to person, place, and time. Coordination normal.  Skin: Skin is warm and dry.  Psychiatric: She has a normal mood and affect. Her behavior is normal. Judgment and thought content normal.  Nursing note and vitals reviewed.      Patient has been counseled extensively about nutrition and exercise as well as the importance of adherence with medications and regular follow-up. The patient was given clear instructions to go to ER or return to medical center if symptoms don't improve, worsen or new problems develop. The patient verbalized understanding.   Follow-up: Return in 8 weeks (on 05/09/2018) for HTN/HPL/DM, Needs appointment with financial representative.Gildardo Pounds, FNP-BC [facility] [place], [place]   03/16/2018, 10:01 AM

## 2018-03-16 NOTE — Patient Instructions (Signed)

## 2018-03-17 LAB — TSH: TSH: 2.25 u[IU]/mL (ref 0.450–4.500)

## 2018-03-20 ENCOUNTER — Other Ambulatory Visit: Payer: Self-pay | Admitting: Nurse Practitioner

## 2018-03-20 DIAGNOSIS — E039 Hypothyroidism, unspecified: Secondary | ICD-10-CM

## 2018-03-20 MED ORDER — SYNTHROID 175 MCG PO TABS: 175.0000 ug | ORAL_TABLET | Freq: Every day | ORAL | 1 refills | Status: DC

## 2018-03-21 ENCOUNTER — Telehealth: Payer: Self-pay

## 2018-03-21 NOTE — Telephone Encounter (Signed)
CMA attempt to call patient to inform on lab results.  No answer and left a VM for patient to call back.  If patient call back, please inform:  Thyroid levels are completely normal. Continue on your current dose of synthroid. Will resend to pharmacy  A letter will be send out.

## 2018-03-21 NOTE — Telephone Encounter (Signed)
-----   Message from Claiborne RiggZelda W Fleming, NP sent at 03/20/2018  9:04 PM EDT ----- Thyroid levels are completely normal. Continue on your current dose of synthroid. Will resend to pharmacy

## 2018-03-25 ENCOUNTER — Ambulatory Visit: Payer: Self-pay | Attending: Nurse Practitioner

## 2018-05-05 ENCOUNTER — Telehealth: Payer: Self-pay | Admitting: Nurse Practitioner

## 2018-05-05 MED ORDER — VALACYCLOVIR HCL 500 MG PO TABS: 500.0000 mg | ORAL_TABLET | Freq: Two times a day (BID) | ORAL | 0 refills | Status: DC

## 2018-05-05 NOTE — Telephone Encounter (Signed)
Refilled

## 2018-05-05 NOTE — Telephone Encounter (Signed)
1) Medication(s) Requested (by name): valACYclovir (VALTREX) 500 MG tablet [[id number]]   2) Pharmacy of Choice: [facility] [address]  Patient's PCP is unavailable. Please follow up.

## 2018-05-09 MED FILL — TRUEplus LANCETS 28G MISC: 30 days supply | Qty: 100 | Fill #1

## 2018-05-11 ENCOUNTER — Encounter: Payer: Self-pay | Admitting: Nurse Practitioner

## 2018-05-11 ENCOUNTER — Ambulatory Visit: Payer: Self-pay | Attending: Nurse Practitioner | Admitting: Nurse Practitioner

## 2018-05-11 VITALS — BP 132/86 | HR 85 | Temp 98.4°F | Ht 73.0 in | Wt 197.6 lb

## 2018-05-11 DIAGNOSIS — B009 Herpesviral infection, unspecified: Secondary | ICD-10-CM

## 2018-05-11 DIAGNOSIS — K648 Other hemorrhoids: Secondary | ICD-10-CM | POA: Insufficient documentation

## 2018-05-11 DIAGNOSIS — Z79899 Other long term (current) drug therapy: Secondary | ICD-10-CM | POA: Insufficient documentation

## 2018-05-11 DIAGNOSIS — N760 Acute vaginitis: Secondary | ICD-10-CM | POA: Insufficient documentation

## 2018-05-11 DIAGNOSIS — B9689 Other specified bacterial agents as the cause of diseases classified elsewhere: Secondary | ICD-10-CM | POA: Insufficient documentation

## 2018-05-11 DIAGNOSIS — Z79890 Hormone replacement therapy (postmenopausal): Secondary | ICD-10-CM | POA: Insufficient documentation

## 2018-05-11 DIAGNOSIS — E1165 Type 2 diabetes mellitus with hyperglycemia: Secondary | ICD-10-CM | POA: Insufficient documentation

## 2018-05-11 DIAGNOSIS — Z9111 Patient's noncompliance with dietary regimen: Secondary | ICD-10-CM | POA: Insufficient documentation

## 2018-05-11 DIAGNOSIS — E785 Hyperlipidemia, unspecified: Secondary | ICD-10-CM | POA: Insufficient documentation

## 2018-05-11 DIAGNOSIS — E039 Hypothyroidism, unspecified: Secondary | ICD-10-CM | POA: Insufficient documentation

## 2018-05-11 DIAGNOSIS — Z7984 Long term (current) use of oral hypoglycemic drugs: Secondary | ICD-10-CM | POA: Insufficient documentation

## 2018-05-11 DIAGNOSIS — I10 Essential (primary) hypertension: Secondary | ICD-10-CM | POA: Insufficient documentation

## 2018-05-11 LAB — POCT URINALYSIS DIP (CLINITEK)
Bilirubin, UA: NEGATIVE
Leukocytes, UA: NEGATIVE
NITRITE UA: NEGATIVE
PH UA: 5.5 (ref 5.0–8.0)
RBC UA: NEGATIVE
SPEC GRAV UA: 1.025 (ref 1.010–1.025)
UROBILINOGEN UA: 0.2 U/dL

## 2018-05-11 LAB — GLUCOSE, POCT (MANUAL RESULT ENTRY)
POC GLUCOSE: 294 mg/dL — AB (ref 70–99)
POC Glucose: 308 mg/dl — AB (ref 70–99)

## 2018-05-11 LAB — POCT GLYCOSYLATED HEMOGLOBIN (HGB A1C): Hemoglobin A1C: 11.1 % — AB (ref 4.0–5.6)

## 2018-05-11 MED ORDER — SITAGLIPTIN PHOS-METFORMIN HCL 50-1000 MG PO TABS: 1.0000 | ORAL_TABLET | Freq: Two times a day (BID) | ORAL | 2 refills | Status: DC

## 2018-05-11 MED ORDER — LIDOCAINE-HYDROCORTISONE ACE 3-0.5 % RE CREA
1.0000 | TOPICAL_CREAM | Freq: Two times a day (BID) | RECTAL | 0 refills | Status: DC
Start: 2018-05-11 — End: 2019-03-18

## 2018-05-11 MED ORDER — LOSARTAN POTASSIUM-HCTZ 100-25 MG PO TABS: 1.0000 | ORAL_TABLET | Freq: Every day | ORAL | 1 refills | Status: DC

## 2018-05-11 MED ORDER — SYNTHROID 175 MCG PO TABS: 175.0000 ug | ORAL_TABLET | Freq: Every day | ORAL | 1 refills | Status: DC

## 2018-05-11 MED ORDER — VALACYCLOVIR HCL 500 MG PO TABS: 500.0000 mg | ORAL_TABLET | Freq: Two times a day (BID) | ORAL | 0 refills | Status: DC

## 2018-05-11 MED ORDER — TRUEPLUS LANCETS 28G MISC: 1 refills | Status: DC

## 2018-05-11 MED ORDER — INSULIN ASPART 100 UNIT/ML ~~LOC~~ SOLN
10.0000 [IU] | Freq: Once | SUBCUTANEOUS | Status: AC
Start: 2018-05-11 — End: 2018-05-11
  Administered 2018-05-11: 10 [IU] via SUBCUTANEOUS

## 2018-05-11 MED ORDER — ATORVASTATIN CALCIUM 40 MG PO TABS: 40.0000 mg | ORAL_TABLET | Freq: Every day | ORAL | 1 refills | Status: DC

## 2018-05-11 MED ORDER — AMLODIPINE BESYLATE 10 MG PO TABS
10.0000 mg | ORAL_TABLET | Freq: Every day | ORAL | 3 refills | Status: DC
Start: 2018-05-11 — End: 2019-03-18

## 2018-05-11 MED ORDER — LIDOCAINE (ANORECTAL) 5 % EX GEL: CUTANEOUS | 0 refills | Status: DC

## 2018-05-11 MED ORDER — GLUCOSE BLOOD VI STRP: ORAL_STRIP | 12 refills | Status: DC

## 2018-05-11 MED FILL — TRUE METRIX TEST STRIP: 30 days supply | Qty: 100 | Fill #1

## 2018-05-11 NOTE — Progress Notes (Signed)
Assessment & Plan:  Diana Fleming was seen today for follow-up.  Diagnoses and all orders for this visit:  Type 2 diabetes mellitus with hyperglycemia, unspecified whether long term insulin use (HCC) -     Glucose (CBG) -     HgB A1c -     insulin aspart (novoLOG) injection 10 Units -     sitaGLIPtin-metformin (JANUMET) 50-1000 MG tablet; Take 1 tablet by mouth 2 (two) times daily with a meal. -     TRUEPLUS LANCETS 28G MISC; Check blood sugar fasting and at meals -     glucose blood (TRUE METRIX BLOOD GLUCOSE TEST) test strip; Use as instructed -     Glucose (CBG) -     POCT URINALYSIS DIP (CLINITEK)  Continue blood sugar control as discussed in office today, low carbohydrate diet, and regular physical exercise as tolerated, 150 minutes per week (30 min each day, 5 days per week, or 50 min 3 days per week). Keep blood sugar logs with fasting goal of 90-130 mg/dl, post prandial (after you eat) less than 180.  For Hypoglycemia: BS <60 and Hyperglycemia BS >400; contact the clinic ASAP. Annual eye exams and foot exams are recommended.   Essential hypertension -     losartan-hydrochlorothiazide (HYZAAR) 100-25 MG tablet; Take 1 tablet by mouth daily. -     amLODipine (NORVASC) 10 MG tablet; Take 1 tablet (10 mg total) by mouth daily. Continue all antihypertensives as prescribed.  Remember to bring in your blood pressure log with you for your follow up appointment.  DASH/Mediterranean Diets are healthier choices for HTN.    Acute vaginitis -     Urine cytology ancillary only  Acquired hypothyroidism -     SYNTHROID 175 MCG tablet; Take 1 tablet (175 mcg total) by mouth daily.  Hyperlipidemia, unspecified hyperlipidemia type -     atorvastatin (LIPITOR) 40 MG tablet; Take 1 tablet (40 mg total) by mouth daily at 6 PM. INSTRUCTIONS: Work on a low fat, heart healthy diet and participate in regular aerobic exercise program by working out at least 150 minutes per week; 5 days a week-30  minutes per day. Avoid red meat, fried foods. junk foods, sodas, sugary drinks, unhealthy snacking, alcohol and smoking.  Drink at least 48oz of water per day and monitor your carbohydrate intake daily.   HSV (herpes simplex virus) infection-History (patient requested a prescription today for future outbreak) -     valACYclovir (VALTREX) 500 MG tablet; Take 1 tablet (500 mg total) by mouth 2 (two) times daily.  Other hemorrhoids -     lidocaine-hydrocortisone (ANAMANTEL HC) 3-0.5 % CREA; Place 1 Applicatorful rectally 2 (two) times daily.   Patient has been counseled on age-appropriate routine health concerns for screening and prevention. These are reviewed and up-to-date. Referrals have been placed accordingly. Immunizations are up-to-date or declined.    Subjective:   Chief Complaint  Patient presents with  . Follow-up    Pt. is here for her diabetes.    HPI Diana Fleming 52 y.o. female presents to office today to follow up for DM and HTN. She has complaints of vaginitis symptoms as well as hemorrhoids today.    Vaginitis: Patient complains of an abnormal vaginal symptoms for a few weeks. Vaginal symptoms include local irritation and vulvar itching. STI Risk: Possible STD exposure. Discharge described as: normal and physiologic.Other associated symptoms: none. Menstrual pattern: Postmenopausal.    Hemorrhoids: Patient complains of external hemorrhoids. Onset of symptoms was  several days  ago ago with gradually worsening course since that time.  She describes symptoms as anorectal itching, constipation, pain with sitting and painful perianal swelling. Treatment to date has been none. Patient denies family hx of colorectal CA, known or suspected STD exposure, maroon colored stools, melena and receptive anal intercourse.   DM Type 2 Chronic. Worsening. A1c up from 8.5 to 11.1. She ran out of lancets several weeks ago so she has not been monitoring her blood glucose levels at home. She  endorses medication compliance taking metformin 1000 mg bid however she has not been diet compliant. She denies any hypo or hyperglycemic symptoms. Will switch from metformin to janumet today. She is overdue for eye exam. Patient has been advised to apply for financial assistance and schedule to see our financial counselor.  Lab Results  Component Value Date   HGBA1C 11.1 (A) 05/11/2018    CHRONIC HYPERTENSION Disease Monitoring: blood pressure is well controlled today. She does not monitor her blood pressure at home.   Blood pressure range BP Readings from Last 3 Encounters:  05/11/18 132/86  03/16/18 139/89  02/23/18 134/86   Chest pain: no   Dyspnea: no   Claudication: no  Medication compliance: yes, taking hyzaar 100-25 mg daily and norvasc 10 mg daily  Medication Side Effects  Lightheadedness: no   Urinary frequency: no   Edema: no   Impotence: no  Preventitive Healthcare:  Exercise: no   Diet Pattern: diet: general  Salt Restriction:  No  Hyperlipidemia Patient presents for follow up to hyperlipidemia.  She is not medication compliant taking atorvastatin 40 mg daily. Reports she saw a commercial one night about statins that kill people and one of the statins discussed was atorvastatin. We discussed the risks of poorly controlled lipid levels in regards to heart attack and stroke as well as death. I have advised her to take her atorvastatin as prescribed.  She is not diet compliant and denies exertional chest pressure/discomfort, fatigue, lower extremity edema and skin xanthelasma or statin intolerance including myalgias. LDL not at goal.  Lab Results  Component Value Date   CHOL 280 (H) 01/31/2018   Lab Results  Component Value Date   HDL 56 01/31/2018   Lab Results  Component Value Date   LDLCALC 180 (H) 01/31/2018   Lab Results  Component Value Date   TRIG 221 (H) 01/31/2018   Lab Results  Component Value Date   CHOLHDL 5.0 01/31/2018     Review of Systems    Constitutional: Negative for fever, malaise/fatigue and weight loss.  HENT: Negative.  Negative for nosebleeds.   Eyes: Negative.  Negative for blurred vision, double vision and photophobia.  Respiratory: Negative.  Negative for cough and shortness of breath.   Cardiovascular: Negative.  Negative for chest pain, palpitations and leg swelling.  Gastrointestinal: Positive for constipation. Negative for abdominal pain, blood in stool, diarrhea, heartburn, melena, nausea and vomiting.       External hemorrhoids  Genitourinary: Negative for dysuria and flank pain.       See HPI  Musculoskeletal: Negative.  Negative for myalgias.  Neurological: Negative.  Negative for dizziness, focal weakness, seizures and headaches.  Psychiatric/Behavioral: Negative.  Negative for suicidal ideas.    Past Medical History:  Diagnosis Date  . Diabetes mellitus, type 2 ([place])   . Dyslipidemia   . 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 Hypertension   . Hypothyroidism     Past Surgical History:  Procedure Laterality Date  .  DILATION AND CURETTAGE, DIAGNOSTIC / THERAPEUTIC  2002  . Radioiodine ablation      Family History  Problem Relation Age of Onset  . Cancer Mother   . Thyroid disease Mother   . Heart attack Maternal Grandfather   . Heart attack Maternal Grandmother     Social History Reviewed with no changes to be made today.   Outpatient Medications Prior to Visit  Medication Sig Dispense Refill  . Blood Glucose Monitoring Suppl (TRUE METRIX METER) w/Device KIT Check blood sugars as directed 1 kit 0  . amLODipine (NORVASC) 10 MG tablet Take 1 tablet (10 mg total) by mouth daily. 30 tablet 3  . atorvastatin (LIPITOR) 40 MG tablet Take 1 tablet (40 mg total) by mouth daily at 6 PM. 30 tablet 1  . glucose blood (TRUE METRIX BLOOD GLUCOSE TEST) test strip Use as instructed 100 each 12  . losartan-hydrochlorothiazide (HYZAAR) 100-25 MG tablet Take 1 tablet by mouth daily. 30 tablet 1   . metFORMIN (GLUCOPHAGE) 1000 MG tablet Take 1 tablet (1,000 mg total) by mouth 2 (two) times daily with a meal. 180 tablet 3  . SYNTHROID 175 MCG tablet Take 1 tablet (175 mcg total) by mouth daily. 30 tablet 1  . TRUEPLUS LANCETS 28G MISC Check blood sugar fasting and at meals 100 each 1  . valACYclovir (VALTREX) 500 MG tablet Take 1 tablet (500 mg total) by mouth 2 (two) times daily. 14 tablet 0   No facility-administered medications prior to visit.     No Known Allergies     Objective:    BP 132/86 (BP Location: Right Arm, Patient Position: Sitting, Cuff Size: Normal)   Pulse 85   Temp 98.4 F (36.9 C) (Oral)   Ht 6' 1" (1.854 m)   Wt 197 lb 9.6 oz (89.6 kg)   LMP 12/05/2012   SpO2 100%   BMI 26.07 kg/m  Wt Readings from Last 3 Encounters:  05/11/18 197 lb 9.6 oz (89.6 kg)  03/16/18 209 lb 3.2 oz (94.9 kg)  02/23/18 213 lb (96.6 kg)    Physical Exam       Patient has been counseled extensively about nutrition and exercise as well as the importance of adherence with medications and regular follow-up. The patient was given clear instructions to go to ER or return to medical center if symptoms don't improve, worsen or new problems develop. The patient verbalized understanding.   Follow-up: Return for PAP SMEAR.   Gildardo Pounds, FNP-BC [facility] and [facility] [place], [place]   05/12/2018, 8:28 PM

## 2018-05-12 ENCOUNTER — Encounter: Payer: Self-pay | Admitting: Nurse Practitioner

## 2018-05-12 ENCOUNTER — Other Ambulatory Visit: Payer: Self-pay

## 2018-05-12 LAB — URINE CYTOLOGY ANCILLARY ONLY
Chlamydia: NEGATIVE
Neisseria Gonorrhea: NEGATIVE
Trichomonas: NEGATIVE

## 2018-05-12 NOTE — Telephone Encounter (Signed)
[facility] due to cost and availability.

## 2018-05-13 LAB — URINE CYTOLOGY ANCILLARY ONLY
Bacterial vaginitis: POSITIVE — AB
CANDIDA VAGINITIS: NEGATIVE

## 2018-05-13 MED ORDER — HYDROCORTISONE ACE-PRAMOXINE 2.5-1 % RE CREA: 1.0000 "application " | TOPICAL_CREAM | Freq: Three times a day (TID) | RECTAL | 0 refills | Status: DC

## 2018-05-15 ENCOUNTER — Other Ambulatory Visit: Payer: Self-pay | Admitting: Nurse Practitioner

## 2018-05-15 MED ORDER — METRONIDAZOLE 500 MG PO TABS
500.0000 mg | ORAL_TABLET | Freq: Two times a day (BID) | ORAL | 0 refills | Status: AC
Start: 2018-05-15 — End: 2018-05-22

## 2018-05-16 ENCOUNTER — Telehealth: Payer: Self-pay

## 2018-05-16 NOTE — Telephone Encounter (Signed)
CMA spoke to patient to inform on results.  Patient verified DOB. Patient understood.  Patient is aware of new prescription.

## 2018-05-16 NOTE — Telephone Encounter (Signed)
-----   Message from Claiborne RiggZelda W Fleming, NP sent at 05/12/2018  8:50 PM EDT ----- Labs still pending for bacterial vaginosis and yeast. Other urine results are negative for bacteria

## 2018-05-16 NOTE — Telephone Encounter (Signed)
-----   Message from Claiborne RiggZelda W Fleming, NP sent at 05/15/2018 11:09 PM EDT ----- Urine positive for bacterial vaginosis. Will send prescription to pharmacy

## 2018-06-07 MED FILL — SYNTHROID 175 MCG TABLET: 175 | 30 days supply | Qty: 30 | Fill #0

## 2018-06-07 MED FILL — AMLODIPINE BESYLATE 10 MG T: 10 | 30 days supply | Qty: 30 | Fill #0

## 2018-06-07 MED FILL — LOSARTAN-HCTZ 100-25 MG TAB: 100-25 | 30 days supply | Qty: 30 | Fill #0

## 2018-06-10 ENCOUNTER — Other Ambulatory Visit: Payer: Self-pay | Admitting: Nurse Practitioner

## 2018-07-05 ENCOUNTER — Ambulatory Visit: Payer: Self-pay | Attending: Nurse Practitioner | Admitting: Nurse Practitioner

## 2018-07-05 ENCOUNTER — Encounter: Payer: Self-pay | Admitting: Nurse Practitioner

## 2018-07-05 VITALS — BP 151/97 | HR 86 | Temp 98.3°F | Ht 73.0 in | Wt 202.0 lb

## 2018-07-05 DIAGNOSIS — E785 Hyperlipidemia, unspecified: Secondary | ICD-10-CM | POA: Insufficient documentation

## 2018-07-05 DIAGNOSIS — Z8249 Family history of ischemic heart disease and other diseases of the circulatory system: Secondary | ICD-10-CM | POA: Insufficient documentation

## 2018-07-05 DIAGNOSIS — Z7984 Long term (current) use of oral hypoglycemic drugs: Secondary | ICD-10-CM | POA: Insufficient documentation

## 2018-07-05 DIAGNOSIS — B373 Candidiasis of vulva and vagina: Secondary | ICD-10-CM | POA: Insufficient documentation

## 2018-07-05 DIAGNOSIS — Z124 Encounter for screening for malignant neoplasm of cervix: Secondary | ICD-10-CM

## 2018-07-05 DIAGNOSIS — Z79899 Other long term (current) drug therapy: Secondary | ICD-10-CM | POA: Insufficient documentation

## 2018-07-05 DIAGNOSIS — E1165 Type 2 diabetes mellitus with hyperglycemia: Secondary | ICD-10-CM | POA: Insufficient documentation

## 2018-07-05 DIAGNOSIS — I10 Essential (primary) hypertension: Secondary | ICD-10-CM | POA: Insufficient documentation

## 2018-07-05 DIAGNOSIS — E039 Hypothyroidism, unspecified: Secondary | ICD-10-CM | POA: Insufficient documentation

## 2018-07-05 DIAGNOSIS — B3731 Acute candidiasis of vulva and vagina: Secondary | ICD-10-CM

## 2018-07-05 DIAGNOSIS — Z01419 Encounter for gynecological examination (general) (routine) without abnormal findings: Secondary | ICD-10-CM | POA: Insufficient documentation

## 2018-07-05 LAB — GLUCOSE, POCT (MANUAL RESULT ENTRY): POC Glucose: 257 mg/dl — AB (ref 70–99)

## 2018-07-05 MED ORDER — HYDROCORTISONE 1 % EX CREA: 1.0000 "application " | TOPICAL_CREAM | Freq: Two times a day (BID) | CUTANEOUS | 0 refills | Status: DC

## 2018-07-05 MED ORDER — FLUCONAZOLE 150 MG PO TABS: 150.0000 mg | ORAL_TABLET | ORAL | 0 refills | Status: AC

## 2018-07-05 MED FILL — FLUCONAZOLE 150 MG TABS: 150 | 3 days supply | Qty: 3 | Fill #0

## 2018-07-05 NOTE — Progress Notes (Signed)
Assessment & Plan:  Diana Fleming was seen today for gynecologic exam.  Diagnoses and all orders for this visit:  Encounter for Papanicolaou smear for cervical cancer screening -     Cytology - PAP  Vaginal yeast infection -     fluconazole (DIFLUCAN) 150 MG tablet; Take 1 tablet (150 mg total) by mouth every 3 (three) days for 3 doses. -     hydrocortisone cream 1 %; Apply 1 application topically 2 (two) times daily.  Type 2 diabetes mellitus with hyperglycemia, unspecified whether long term insulin use (HCC) -     Glucose (CBG)    Patient has been counseled on age-appropriate routine health concerns for screening and prevention. These are reviewed and up-to-date. Referrals have been placed accordingly. Immunizations are up-to-date or declined.    Subjective:   Chief Complaint  Patient presents with  . Gynecologic Exam    Pt. is here for a pap smear.    HPI Diana Fleming 52 y.o. female presents to office today for routine PAP smear.   Review of Systems  Constitutional: Negative.  Negative for chills, fever, malaise/fatigue and weight loss.  Respiratory: Negative.  Negative for cough, shortness of breath and wheezing.   Cardiovascular: Negative.  Negative for chest pain, orthopnea and leg swelling.  Gastrointestinal: Negative for abdominal pain.  Genitourinary: Negative.  Negative for flank pain.  Skin: Negative.  Negative for rash.  Psychiatric/Behavioral: Negative for suicidal ideas.    Past Medical History:  Diagnosis Date  . Diabetes mellitus, type 2 ([place])   . Dyslipidemia   . 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 Hypertension   . Hypothyroidism     Past Surgical History:  Procedure Laterality Date  . DILATION AND CURETTAGE, DIAGNOSTIC / THERAPEUTIC  2002  . Radioiodine ablation    . TUBAL LIGATION      Family History  Problem Relation Age of Onset  . Cancer Mother   . Thyroid disease Mother   . Heart attack Maternal Grandfather   . Heart  attack Maternal Grandmother     Social History Reviewed with no changes to be made today.   Outpatient Medications Prior to Visit  Medication Sig Dispense Refill  . amLODipine (NORVASC) 10 MG tablet Take 1 tablet (10 mg total) by mouth daily. 90 tablet 3  . atorvastatin (LIPITOR) 40 MG tablet Take 1 tablet (40 mg total) by mouth daily at 6 PM. 90 tablet 1  . Blood Glucose Monitoring Suppl (TRUE METRIX METER) w/Device KIT Check blood sugars as directed 1 kit 0  . glucose blood (TRUE METRIX BLOOD GLUCOSE TEST) test strip Use as instructed 100 each 12  . hydrocortisone-pramoxine (ANALPRAM-HC) 2.5-1 % rectal cream Place 1 application rectally 3 (three) times daily. 30 g 0  . losartan-hydrochlorothiazide (HYZAAR) 100-25 MG tablet Take 1 tablet by mouth daily. 90 tablet 1  . sitaGLIPtin-metformin (JANUMET) 50-1000 MG tablet Take 1 tablet by mouth 2 (two) times daily with a meal. 180 tablet 2  . SYNTHROID 175 MCG tablet Take 1 tablet (175 mcg total) by mouth daily. 30 tablet 1  . TRUEPLUS LANCETS 28G MISC Check blood sugar fasting and at meals 100 each 1  . valACYclovir (VALTREX) 500 MG tablet Take 1 tablet (500 mg total) by mouth 2 (two) times daily. 14 tablet 0  . lidocaine-hydrocortisone (ANAMANTEL HC) 3-0.5 % CREA Place 1 Applicatorful rectally 2 (two) times daily. (Patient not taking: Reported on 07/05/2018) 85 g 0   No facility-administered  medications prior to visit.     No Known Allergies     Objective:    BP (!) 151/97 (BP Location: Left Arm, Patient Position: Sitting, Cuff Size: Normal)   Pulse 86   Temp 98.3 F (36.8 C) (Oral)   Ht 6' 1"  (1.854 m)   Wt 202 lb (91.6 kg)   LMP 12/05/2012   SpO2 96%   BMI 26.65 kg/m  Wt Readings from Last 3 Encounters:  07/05/18 202 lb (91.6 kg)  05/11/18 197 lb 9.6 oz (89.6 kg)  03/16/18 209 lb 3.2 oz (94.9 kg)    Physical Exam  Constitutional: She is oriented to person, place, and time. She appears well-developed and well-nourished.    HENT:  Head: Normocephalic.  Cardiovascular: Normal rate, regular rhythm and normal heart sounds.  Pulmonary/Chest: Effort normal and breath sounds normal.  Abdominal: Soft. Bowel sounds are normal. Hernia confirmed negative in the right inguinal area and confirmed negative in the left inguinal area.  Genitourinary: Rectal exam shows external hemorrhoid. No labial fusion. There is no rash, tenderness, lesion or injury on the right labia. There is no rash, tenderness, lesion or injury on the left labia. Uterus is deviated. Uterus is not enlarged. Cervix exhibits no motion tenderness and no friability. Right adnexum displays no mass, no tenderness and no fullness. Left adnexum displays no mass, no tenderness and no fullness. No erythema, tenderness or bleeding in the vagina. No foreign body in the vagina. No signs of injury around the vagina. Vaginal discharge found.  Genitourinary Comments: Thick yellowish white curd appearing discharge throughout labia and cervix  Lymphadenopathy: No inguinal adenopathy noted on the right or left side.       Right: No inguinal adenopathy present.       Left: No inguinal adenopathy present.  Neurological: She is alert and oriented to person, place, and time.  Skin: Skin is warm and dry.  Psychiatric: She has a normal mood and affect. Her behavior is normal. Judgment and thought content normal.         Patient has been counseled extensively about nutrition and exercise as well as the importance of adherence with medications and regular follow-up. The patient was given clear instructions to go to ER or return to medical center if symptoms don't improve, worsen or new problems develop. The patient verbalized understanding.   Follow-up: Return in 6 weeks (on 08/16/2018).   Gildardo Pounds, FNP-BC [facility] and [facility] [place], [place]   07/05/2018, 9:23 AM

## 2018-07-07 LAB — CYTOLOGY - PAP
Bacterial vaginitis: POSITIVE — AB
CANDIDA VAGINITIS: POSITIVE — AB
Chlamydia: NEGATIVE
Diagnosis: NEGATIVE
HPV (WINDOPATH): NOT DETECTED
Neisseria Gonorrhea: NEGATIVE
TRICH (WINDOWPATH): NEGATIVE

## 2018-07-11 ENCOUNTER — Other Ambulatory Visit: Payer: Self-pay | Admitting: Nurse Practitioner

## 2018-07-11 MED ORDER — METRONIDAZOLE 500 MG PO TABS: 500.0000 mg | ORAL_TABLET | Freq: Two times a day (BID) | ORAL | 0 refills | Status: AC

## 2018-07-11 MED FILL — SYNTHROID 175 MCG TABLET: 175 | 30 days supply | Qty: 30 | Fill #1

## 2018-07-11 MED FILL — AMLODIPINE BESYLATE 10 MG T: 10 | 30 days supply | Qty: 30 | Fill #1

## 2018-07-12 ENCOUNTER — Telehealth: Payer: Self-pay

## 2018-07-12 MED FILL — ATORVASTATIN 40 MG TABLET: 40 | 30 days supply | Qty: 30 | Fill #0

## 2018-07-12 MED FILL — metroNIDAZOLE 500 MG TABS: 500 | 7 days supply | Qty: 14 | Fill #0

## 2018-07-12 MED FILL — LOSARTAN-HCTZ 100-25 MG TAB: 100-25 | 30 days supply | Qty: 30 | Fill #1

## 2018-07-12 NOTE — Telephone Encounter (Signed)
CMA spoke to patient to inform on results.  Patient verified DOB. Patient understood.  Patient is aware of Rx.

## 2018-07-12 NOTE — Telephone Encounter (Signed)
-----   Message from Claiborne RiggZelda W Fleming, NP sent at 07/11/2018  5:48 PM EST ----- PAP negative for cervical cancer but positive for bacteria and yeast. I have already treated you for yeast and will send script for bacteria.

## 2018-08-09 MED FILL — SYNTHROID 175 MCG TABLET: 175 | 30 days supply | Qty: 30 | Fill #0

## 2018-08-17 ENCOUNTER — Ambulatory Visit: Payer: Self-pay | Attending: Nurse Practitioner | Admitting: Nurse Practitioner

## 2018-08-17 VITALS — BP 126/85 | HR 93 | Temp 98.1°F | Resp 16 | Wt 200.8 lb

## 2018-08-17 DIAGNOSIS — E1165 Type 2 diabetes mellitus with hyperglycemia: Secondary | ICD-10-CM | POA: Insufficient documentation

## 2018-08-17 DIAGNOSIS — E039 Hypothyroidism, unspecified: Secondary | ICD-10-CM | POA: Insufficient documentation

## 2018-08-17 DIAGNOSIS — B9689 Other specified bacterial agents as the cause of diseases classified elsewhere: Secondary | ICD-10-CM | POA: Insufficient documentation

## 2018-08-17 DIAGNOSIS — Z7984 Long term (current) use of oral hypoglycemic drugs: Secondary | ICD-10-CM | POA: Insufficient documentation

## 2018-08-17 DIAGNOSIS — Z9111 Patient's noncompliance with dietary regimen: Secondary | ICD-10-CM | POA: Insufficient documentation

## 2018-08-17 DIAGNOSIS — E785 Hyperlipidemia, unspecified: Secondary | ICD-10-CM | POA: Insufficient documentation

## 2018-08-17 DIAGNOSIS — I10 Essential (primary) hypertension: Secondary | ICD-10-CM | POA: Insufficient documentation

## 2018-08-17 DIAGNOSIS — Z79899 Other long term (current) drug therapy: Secondary | ICD-10-CM | POA: Insufficient documentation

## 2018-08-17 DIAGNOSIS — N760 Acute vaginitis: Secondary | ICD-10-CM | POA: Insufficient documentation

## 2018-08-17 LAB — GLUCOSE, POCT (MANUAL RESULT ENTRY): POC Glucose: 200 mg/dl — AB (ref 70–99)

## 2018-08-17 LAB — POCT GLYCOSYLATED HEMOGLOBIN (HGB A1C): Hemoglobin A1C: 11.2 % — AB (ref 4.0–5.6)

## 2018-08-17 MED ORDER — INSULIN PEN NEEDLE 31G X 5 MM MISC: 1 refills | Status: DC

## 2018-08-17 MED ORDER — LIRAGLUTIDE 18 MG/3ML ~~LOC~~ SOPN: PEN_INJECTOR | SUBCUTANEOUS | 3 refills | Status: DC

## 2018-08-17 NOTE — Progress Notes (Signed)
Assessment & Plan:  Diana Fleming was seen today for follow-up.  Diagnoses and all orders for this visit:  Type 2 diabetes mellitus with hyperglycemia, unspecified whether long term insulin use (HCC) -     Glucose (CBG) -     HgB A1c -     liraglutide (VICTOZA) 18 MG/3ML SOPN; Insert 0.6 mg daily for 1 week, then increase to 1.2 mg daily; if fasting glucose >130 after another week, may increase to 1.8 mg daily -     Insulin Pen Needle (B-D UF III MINI PEN NEEDLES) 31G X 5 MM MISC; Use as instructed. Check blood glucose levels 1-2 times per day.  Essential hypertension Continue all antihypertensives as prescribed.  Remember to bring in your blood pressure log with you for your follow up appointment.  DASH/Mediterranean Diets are healthier choices for HTN.    Bacterial vaginosis -     metroNIDAZOLE (METROGEL) 0.75 % gel; Apply 1 application topically 2 (two) times daily.    Patient has been counseled on age-appropriate routine health concerns for screening and prevention. These are reviewed and up-to-date. Referrals have been placed accordingly. Immunizations are up-to-date or declined.    Subjective:   Chief Complaint  Patient presents with  . Follow-up   HPI Diana Fleming 52 y.o. female presents to office today for follow up to HTN, DM and HPL.  She does endorse increased family stress. She is currently the main caregiver for her 41 year old grand daughter and not receiving any financial assistance from her daughter who is the child's mother. She also reports there is a strained relationship between her husband and her daughter. She was prescribed PO flagyl a month ago but reports it causes GI irritation and she did not complete the prescription as instructed. Will order metronidazole gel.    Essential Hypertension Blood pressure is well controlled today. She endorses medication compliance taking Norvasc '10mg'$  and hyzaar 100-'25mg'$  daily. She currently denies chest pain, shortness of  breath, palpitations, lightheadedness, dizziness, headaches or BLE edema.  BP Readings from Last 3 Encounters:  08/17/18 126/85  07/05/18 (!) 151/97  05/11/18 132/86     DM TYPE 2 Chronic and worsening. She has not been monitoring her blood glucose levels at home. Endorses medication and dietary non compliance. I will start her on Victoza 0.'6mg'$  today. She will titrate up to 1.'8mg'$  if needed. She will continue on Janumet 50-1000 mg BID. She denies and hypo or hyperglycemic symptoms. Weight is stable.  Lab Results  Component Value Date   HGBA1C 11.2 (A) 08/17/2018   Review of Systems  Constitutional: Negative for fever, malaise/fatigue and weight loss.  HENT: Negative.  Negative for nosebleeds.   Eyes: Negative.  Negative for blurred vision, double vision and photophobia.  Respiratory: Negative.  Negative for cough and shortness of breath.   Cardiovascular: Negative.  Negative for chest pain, palpitations and leg swelling.  Gastrointestinal: Negative.  Negative for heartburn, nausea and vomiting.  Musculoskeletal: Negative.  Negative for myalgias.  Neurological: Negative.  Negative for dizziness, focal weakness, seizures and headaches.  Psychiatric/Behavioral: Negative.  Negative for suicidal ideas.    Past Medical History:  Diagnosis Date  . Diabetes mellitus, type 2 ([place])   . Dyslipidemia   . 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 Hypertension   . Hypothyroidism     Past Surgical History:  Procedure Laterality Date  . DILATION AND CURETTAGE, DIAGNOSTIC / THERAPEUTIC  2002  . Radioiodine ablation    .  TUBAL LIGATION      Family History  Problem Relation Age of Onset  . Cancer Mother   . Thyroid disease Mother   . Heart attack Maternal Grandfather   . Heart attack Maternal Grandmother     Social History Reviewed with no changes to be made today.   Outpatient Medications Prior to Visit  Medication Sig Dispense Refill  . amLODipine (NORVASC) 10 MG tablet  Take 1 tablet (10 mg total) by mouth daily. 90 tablet 3  . atorvastatin (LIPITOR) 40 MG tablet Take 1 tablet (40 mg total) by mouth daily at 6 PM. 90 tablet 1  . Blood Glucose Monitoring Suppl (TRUE METRIX METER) w/Device KIT Check blood sugars as directed 1 kit 0  . glucose blood (TRUE METRIX BLOOD GLUCOSE TEST) test strip Use as instructed 100 each 12  . hydrocortisone cream 1 % Apply 1 application topically 2 (two) times daily. 30 g 0  . hydrocortisone-pramoxine (ANALPRAM-HC) 2.5-1 % rectal cream Place 1 application rectally 3 (three) times daily. 30 g 0  . lidocaine-hydrocortisone (ANAMANTEL HC) 3-0.5 % CREA Place 1 Applicatorful rectally 2 (two) times daily. 85 g 0  . SYNTHROID 175 MCG tablet Take 1 tablet (175 mcg total) by mouth daily. 30 tablet 1  . TRUEPLUS LANCETS 28G MISC Check blood sugar fasting and at meals 100 each 1  . valACYclovir (VALTREX) 500 MG tablet Take 1 tablet (500 mg total) by mouth 2 (two) times daily. 14 tablet 0  . losartan-hydrochlorothiazide (HYZAAR) 100-25 MG tablet Take 1 tablet by mouth daily. 90 tablet 1  . sitaGLIPtin-metformin (JANUMET) 50-1000 MG tablet Take 1 tablet by mouth 2 (two) times daily with a meal. 180 tablet 2   No facility-administered medications prior to visit.     No Known Allergies     Objective:    BP 126/85   Pulse 93   Temp 98.1 F (36.7 C) (Oral)   Resp 16   Wt 200 lb 12.8 oz (91.1 kg)   LMP 12/05/2012   SpO2 97%   BMI 26.49 kg/m  Wt Readings from Last 3 Encounters:  08/17/18 200 lb 12.8 oz (91.1 kg)  07/05/18 202 lb (91.6 kg)  05/11/18 197 lb 9.6 oz (89.6 kg)    Physical Exam Vitals signs and nursing note reviewed.  Constitutional:      Appearance: She is well-developed.  HENT:     Head: Normocephalic and atraumatic.  Neck:     Musculoskeletal: Normal range of motion.  Cardiovascular:     Rate and Rhythm: Normal rate and regular rhythm.     Heart sounds: Normal heart sounds. No murmur. No friction rub. No  gallop.   Pulmonary:     Effort: Pulmonary effort is normal. No tachypnea or respiratory distress.     Breath sounds: Normal breath sounds. No decreased breath sounds, wheezing, rhonchi or rales.  Chest:     Chest wall: No tenderness.  Abdominal:     General: Bowel sounds are normal.     Palpations: Abdomen is soft.  Musculoskeletal: Normal range of motion.  Skin:    General: Skin is warm and dry.  Neurological:     Mental Status: She is alert and oriented to person, place, and time.     Coordination: Coordination normal.  Psychiatric:        Behavior: Behavior normal. Behavior is cooperative.        Thought Content: Thought content normal.        Judgment: Judgment normal.  Patient has been counseled extensively about nutrition and exercise as well as the importance of adherence with medications and regular follow-up. The patient was given clear instructions to go to ER or return to medical center if symptoms don't improve, worsen or new problems develop. The patient verbalized understanding.   Follow-up: Return in about 3 weeks (around 09/07/2018) for see luke in 3 weeks for DM check on Victoza and have labs done that DAY!!  see me in 3 months with.   Gildardo Pounds, FNP-BC [facility] and [place], [place]   08/18/2018, 10:30 PM

## 2018-08-18 ENCOUNTER — Encounter: Payer: Self-pay | Admitting: Nurse Practitioner

## 2018-08-18 MED ORDER — METRONIDAZOLE 0.75 % EX GEL: 1.0000 "application " | Freq: Two times a day (BID) | CUTANEOUS | 0 refills | Status: DC

## 2018-09-01 MED FILL — AMLODIPINE BESYLATE 10 MG T: 10 | 30 days supply | Qty: 30 | Fill #2

## 2018-09-01 MED FILL — LOSARTAN-HCTZ 100-25 MG TAB: 100-25 | 30 days supply | Qty: 30 | Fill #2

## 2018-09-01 MED FILL — ATORVASTATIN CALCIUM 40 MG: 40 | 30 days supply | Qty: 30 | Fill #1

## 2018-09-07 ENCOUNTER — Ambulatory Visit: Payer: Self-pay | Admitting: Pharmacist

## 2018-09-09 MED FILL — SYNTHROID 175 MCG TABLET: 175 | 30 days supply | Qty: 30 | Fill #1

## 2018-10-14 ENCOUNTER — Other Ambulatory Visit: Payer: Self-pay | Admitting: Pharmacist

## 2018-10-14 ENCOUNTER — Other Ambulatory Visit: Payer: Self-pay | Admitting: Nurse Practitioner

## 2018-10-14 DIAGNOSIS — I10 Essential (primary) hypertension: Secondary | ICD-10-CM

## 2018-10-14 DIAGNOSIS — E039 Hypothyroidism, unspecified: Secondary | ICD-10-CM

## 2018-10-14 MED ORDER — LOSARTAN POTASSIUM 100 MG PO TABS: 100.0000 mg | ORAL_TABLET | Freq: Every day | ORAL | 2 refills | Status: DC

## 2018-10-14 MED ORDER — HYDROCHLOROTHIAZIDE 25 MG PO TABS: 25.0000 mg | ORAL_TABLET | Freq: Every day | ORAL | 2 refills | Status: DC

## 2018-10-14 MED FILL — HYDROCHLOROTHIAZIDE 25 MG T: 25 | 30 days supply | Qty: 30 | Fill #0

## 2018-10-14 MED FILL — ATORVASTATIN CALCIUM 40 MG: 40 | 30 days supply | Qty: 30 | Fill #2

## 2018-10-14 MED FILL — SYNTHROID 175 MCG TABLET: 175 | 30 days supply | Qty: 30 | Fill #0

## 2018-10-14 MED FILL — AMLODIPINE BESYLATE 10 MG T: 10 | 30 days supply | Qty: 30 | Fill #3

## 2018-10-14 MED FILL — LOSARTAN POTASSIUM 100 MG T: 100 | 30 days supply | Qty: 30 | Fill #0

## 2018-11-23 MED FILL — AMLODIPINE BESYLATE 10 MG T: 10 | 30 days supply | Qty: 30 | Fill #4

## 2018-11-23 MED FILL — SYNTHROID 175 MCG TABLET: 175 | 30 days supply | Qty: 30 | Fill #1

## 2018-11-23 MED FILL — ATORVASTATIN CALCIUM 40 MG: 40 | 30 days supply | Qty: 30 | Fill #3

## 2018-11-23 MED FILL — LOSARTAN-HCTZ 100-25 MG TAB: 100-25 | 30 days supply | Qty: 30 | Fill #3

## 2018-12-27 MED FILL — SYNTHROID 175 MCG TABLET: 175 | 30 days supply | Qty: 30 | Fill #2

## 2018-12-27 MED FILL — HYDROCHLOROTHIAZIDE 25 MG T: 25 | 30 days supply | Qty: 30 | Fill #1

## 2018-12-27 MED FILL — AMLODIPINE BESYLATE 10 MG T: 10 | 30 days supply | Qty: 30 | Fill #5

## 2018-12-27 MED FILL — LOSARTAN POTASSIUM 100 MG T: 100 | 30 days supply | Qty: 30 | Fill #1

## 2018-12-27 MED FILL — ATORVASTATIN CALCIUM 40 MG: 40 | 30 days supply | Qty: 30 | Fill #4

## 2019-01-27 ENCOUNTER — Other Ambulatory Visit: Payer: Self-pay | Admitting: Nurse Practitioner

## 2019-01-27 DIAGNOSIS — E039 Hypothyroidism, unspecified: Secondary | ICD-10-CM

## 2019-01-27 MED FILL — SYNTHROID 175 MCG TABLET: 175 | 30 days supply | Qty: 30 | Fill #0

## 2019-01-27 MED FILL — LOSARTAN-HCTZ 100-25 MG TAB: 100-25 | 30 days supply | Qty: 30 | Fill #4

## 2019-01-27 MED FILL — AMLODIPINE BESYLATE 10 MG T: 10 | 30 days supply | Qty: 30 | Fill #6

## 2019-01-27 MED FILL — ATORVASTATIN CALCIUM 40 MG: 40 | 30 days supply | Qty: 30 | Fill #5

## 2019-02-02 MED FILL — VALACYCLOVIR HCL 500 MG TAB: 500 | 7 days supply | Qty: 14 | Fill #0

## 2019-02-24 ENCOUNTER — Other Ambulatory Visit: Payer: Self-pay | Admitting: Nurse Practitioner

## 2019-02-24 DIAGNOSIS — E039 Hypothyroidism, unspecified: Secondary | ICD-10-CM

## 2019-02-24 DIAGNOSIS — B009 Herpesviral infection, unspecified: Secondary | ICD-10-CM

## 2019-02-24 MED FILL — LOSARTAN POTASSIUM 100 MG T: 100 | 30 days supply | Qty: 30 | Fill #2

## 2019-02-28 ENCOUNTER — Ambulatory Visit: Payer: Self-pay | Admitting: Nurse Practitioner

## 2019-03-06 ENCOUNTER — Other Ambulatory Visit: Payer: Self-pay | Admitting: Nurse Practitioner

## 2019-03-06 DIAGNOSIS — E039 Hypothyroidism, unspecified: Secondary | ICD-10-CM

## 2019-03-06 DIAGNOSIS — B009 Herpesviral infection, unspecified: Secondary | ICD-10-CM

## 2019-03-08 ENCOUNTER — Other Ambulatory Visit: Payer: Self-pay | Admitting: Nurse Practitioner

## 2019-03-08 DIAGNOSIS — E039 Hypothyroidism, unspecified: Secondary | ICD-10-CM

## 2019-03-08 MED FILL — SYNTHROID 175 MCG TABLET: 175 | 15 days supply | Qty: 15 | Fill #0

## 2019-03-17 ENCOUNTER — Ambulatory Visit: Payer: Self-pay | Attending: Nurse Practitioner | Admitting: Nurse Practitioner

## 2019-03-17 ENCOUNTER — Encounter: Payer: Self-pay | Admitting: Nurse Practitioner

## 2019-03-17 ENCOUNTER — Other Ambulatory Visit: Payer: Self-pay

## 2019-03-17 DIAGNOSIS — E039 Hypothyroidism, unspecified: Secondary | ICD-10-CM

## 2019-03-17 DIAGNOSIS — E785 Hyperlipidemia, unspecified: Secondary | ICD-10-CM

## 2019-03-17 DIAGNOSIS — E1165 Type 2 diabetes mellitus with hyperglycemia: Secondary | ICD-10-CM

## 2019-03-17 DIAGNOSIS — I10 Essential (primary) hypertension: Secondary | ICD-10-CM

## 2019-03-17 NOTE — Progress Notes (Signed)
Virtual Visit via Telephone Note Due to national recommendations of social distancing due to [place] 19, telehealth visit is felt to be most appropriate for this patient at this time.  I discussed the limitations, risks, security and privacy concerns of performing an evaluation and management service by telephone and the availability of in person appointments. I also discussed with the patient that there may be a patient responsible charge related to this service. The patient expressed understanding and agreed to proceed.    I connected with Diana Fleming on 03/17/19  at   3:30 PM EDT  EDT by telephone and verified that I am speaking with the correct person using two identifiers.   Consent I discussed the limitations, risks, security and privacy concerns of performing an evaluation and management service by telephone and the availability of in person appointments. I also discussed with the patient that there may be a patient responsible charge related to this service. The patient expressed understanding and agreed to proceed.   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   DM TYPE 2 Not taking any of her medications. States she did not have the money nor did she have transportation to pick up her medications. She has not applied for the financial assistance as she states her spouse will not allow her to send in his pay stubs for financial records. States she will pick up meds Monday and has labs ordered when she gets paid. States blood glucose levels have been running high. She is no diet or exercis  compliant. WIll refill Victoza, Janumet 50-1000 mg BID. She is taking an ARB and Statin.  Lab Results  Component Value Date   HGBA1C 11.2 (A) 08/17/2018    Essential Hypertension She does not monitor her blood pressure at home. Taking Losartan 100 mg daily, amlodipine 10 mg daily, HCTZ 25 mg daily. Denies chest pain, shortness of breath, palpitations, lightheadedness, dizziness, headaches or BLE edema.  BP Readings from Last 3 Encounters:  08/17/18 126/85  07/05/18 (!) 151/97  05/11/18 132/86   Hyperlipidemia LDL not at goal <70.  She is not medication compliant with taking atorvastatin 64m daily.  She is not diet or exercise compliant and denies statin intolerance including myalgias.  Lab Results  Component Value Date   CHOL 280 (H) 01/31/2018   Lab Results  Component Value Date   HDL 56 01/31/2018   Lab Results  Component Value Date   LDLCALC 180 (H) 01/31/2018   Lab Results  Component Value Date   TRIG 221 (H) 01/31/2018   Lab Results  Component Value Date   CHOLHDL 5.0 01/31/2018   No results found for: LDLDIRECT Past Medical History:  Diagnosis Date  . Diabetes mellitus, type 2 ([place]   . Dyslipidemia   . 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 Hypertension   . Hypothyroidism     Past Surgical History:  Procedure Laterality Date  . DILATION AND CURETTAGE, DIAGNOSTIC / THERAPEUTIC  2002  . Radioiodine ablation    . TUBAL LIGATION      Family History  Problem Relation Age of Onset  . Cancer Mother   .  Thyroid disease Mother   . Heart attack Maternal Grandfather   . Heart attack Maternal Grandmother     Social History   Socioeconomic History  . Marital status: Married    Spouse name: Not on file  . Number of children: Not on file  . Years of education: Not on file  . Highest education level: Not on file  Occupational History  . Not on file  Social Needs  . Financial resource strain: Not on file  . Food insecurity    Worry: Not on file     Inability: Not on file  . Transportation needs    Medical: Not on file    Non-medical: Not on file  Tobacco Use  . Smoking status: Never Smoker  . Smokeless tobacco: Never Used  Substance and Sexual Activity  . Alcohol use: Never    Frequency: Never  . Drug use: Never  . Sexual activity: Yes  Lifestyle  . Physical activity    Days per week: Not on file    Minutes per session: Not on file  . Stress: Not on file  Relationships  . Social Herbalist on phone: Not on file    Gets together: Not on file    Attends religious service: Not on file    Active member of club or organization: Not on file    Attends meetings of clubs or organizations: Not on file    Relationship status: Not on file  Other Topics Concern  . Not on file  Social History Narrative  . Not on file     Observations/Objective: Awake, alert and oriented x 3   Review of Systems  Constitutional: Negative for fever, malaise/fatigue and weight loss.  HENT: Negative.  Negative for nosebleeds.   Eyes: Negative.  Negative for blurred vision, double vision and photophobia.  Respiratory: Negative.  Negative for cough and shortness of breath.   Cardiovascular: Negative.  Negative for chest pain, palpitations and leg swelling.  Gastrointestinal: Negative.  Negative for heartburn, nausea and vomiting.  Musculoskeletal: Negative.  Negative for myalgias.  Neurological: Negative.  Negative for dizziness, focal weakness, seizures and headaches.  Psychiatric/Behavioral: Negative.  Negative for suicidal ideas.    Assessment and Plan: Diana Fleming was seen today for follow-up.  Diagnoses and all orders for this visit:  Type 2 diabetes mellitus with hyperglycemia, without long-term current use of insulin (HCC) -     sitaGLIPtin-metformin (JANUMET) 50-1000 MG tablet; Take 1 tablet by mouth 2 (two) times daily with a meal. -     liraglutide (VICTOZA) 18 MG/3ML SOPN; Inject 0.3 mLs (1.8 mg total) into the skin  daily. -     Insulin Pen Needle (B-D UF III MINI PEN NEEDLES) 31G X 5 MM MISC; Use as instructed. Check blood glucose levels 1-2 times per day. -     glucose blood (TRUE METRIX BLOOD GLUCOSE TEST) test strip; Use as instructed. Check blood glucose level by fingerstick twice per day. -     TRUEplus Lancets 28G MISC; Check blood sugar fasting and at meals Continue blood sugar control as discussed in office today, low carbohydrate diet, and regular physical exercise as tolerated, 150 minutes per week (30 min each day, 5 days per week, or 50 min 3 days per week). Keep blood sugar logs with fasting goal of 90-130 mg/dl, post prandial (after you eat) less than 180.  For Hypoglycemia: BS <60 and Hyperglycemia BS >400; contact the clinic ASAP. Annual eye exams and foot  exams are recommended.   Essential hypertension -     losartan (COZAAR) 100 MG tablet; Take 1 tablet (100 mg total) by mouth daily. -     hydrochlorothiazide (HYDRODIURIL) 25 MG tablet; Take 1 tablet (25 mg total) by mouth daily. -     amLODipine (NORVASC) 10 MG tablet; Take 1 tablet (10 mg total) by mouth daily. -     CBC; Future -     CMP14+EGFR; Future Continue all antihypertensives as prescribed.  Remember to bring in your blood pressure log with you for your follow up appointment.  DASH/Mediterranean Diets are healthier choices for HTN.    Hyperlipidemia, unspecified hyperlipidemia type -     atorvastatin (LIPITOR) 40 MG tablet; Take 1 tablet (40 mg total) by mouth daily at 6 PM. -     Lipid panel; Future INSTRUCTIONS: Work on a low fat, heart healthy diet and participate in regular aerobic exercise program by working out at least 150 minutes per week; 5 days a week-30 minutes per day. Avoid red meat, fried foods. junk foods, sodas, sugary drinks, unhealthy snacking, alcohol and smoking.  Drink at least 48oz of water per day and monitor your carbohydrate intake daily.    Hypothyroidism, unspecified type -     TSH; Future Lab  Results  Component Value Date   TSH 2.250 03/16/2018  Denies any symptoms of hypo or hyperthyroidism    Follow Up Instructions Return in about 4 weeks (around 04/14/2019) for bring meter .     I discussed the assessment and treatment plan with the patient. The patient was provided an opportunity to ask questions and all were answered. The patient agreed with the plan and demonstrated an understanding of the instructions.   The patient was advised to call back or seek an in-person evaluation if the symptoms worsen or if the condition fails to improve as anticipated.  I provided 24 minutes of non-face-to-face time during this encounter including median intraservice time, reviewing previous notes, labs, imaging, medications and explaining diagnosis and management.  Gildardo Pounds, FNP-BC

## 2019-03-18 ENCOUNTER — Encounter: Payer: Self-pay | Admitting: Nurse Practitioner

## 2019-03-18 MED ORDER — TRUEPLUS LANCETS 28G MISC: 1 refills | Status: DC

## 2019-03-18 MED ORDER — AMLODIPINE BESYLATE 10 MG PO TABS: 10.0000 mg | ORAL_TABLET | Freq: Every day | ORAL | 0 refills | Status: DC

## 2019-03-18 MED ORDER — LOSARTAN POTASSIUM 100 MG PO TABS: 100.0000 mg | ORAL_TABLET | Freq: Every day | ORAL | 0 refills | Status: DC

## 2019-03-18 MED ORDER — BD PEN NEEDLE MINI U/F 31G X 5 MM MISC: 1 refills | Status: DC

## 2019-03-18 MED ORDER — HYDROCHLOROTHIAZIDE 25 MG PO TABS: 25.0000 mg | ORAL_TABLET | Freq: Every day | ORAL | 0 refills | Status: DC

## 2019-03-18 MED ORDER — TRUE METRIX BLOOD GLUCOSE TEST VI STRP: ORAL_STRIP | 12 refills | Status: DC

## 2019-03-18 MED ORDER — LIRAGLUTIDE 18 MG/3ML ~~LOC~~ SOPN: 1.8000 mg | PEN_INJECTOR | Freq: Every day | SUBCUTANEOUS | 3 refills | Status: DC

## 2019-03-18 MED ORDER — JANUMET 50-1000 MG PO TABS: 1.0000 | ORAL_TABLET | Freq: Two times a day (BID) | ORAL | 2 refills | Status: DC

## 2019-03-18 MED ORDER — ATORVASTATIN CALCIUM 40 MG PO TABS: 40.0000 mg | ORAL_TABLET | Freq: Every day | ORAL | 1 refills | Status: DC

## 2019-03-20 ENCOUNTER — Other Ambulatory Visit: Payer: Self-pay | Admitting: Family Medicine

## 2019-03-20 ENCOUNTER — Other Ambulatory Visit: Payer: Self-pay

## 2019-03-20 ENCOUNTER — Other Ambulatory Visit: Payer: Self-pay | Admitting: Nurse Practitioner

## 2019-03-20 ENCOUNTER — Ambulatory Visit: Payer: Self-pay | Attending: Nurse Practitioner

## 2019-03-20 DIAGNOSIS — E785 Hyperlipidemia, unspecified: Secondary | ICD-10-CM

## 2019-03-20 DIAGNOSIS — E1165 Type 2 diabetes mellitus with hyperglycemia: Secondary | ICD-10-CM

## 2019-03-20 DIAGNOSIS — E039 Hypothyroidism, unspecified: Secondary | ICD-10-CM

## 2019-03-20 DIAGNOSIS — I10 Essential (primary) hypertension: Secondary | ICD-10-CM

## 2019-03-20 MED ORDER — ONETOUCH VERIO VI STRP: ORAL_STRIP | 12 refills | Status: DC

## 2019-03-20 MED ORDER — ONETOUCH VERIO W/DEVICE KIT: PACK | 0 refills | Status: DC

## 2019-03-20 MED ORDER — ONETOUCH DELICA LANCETS 33G MISC: 12 refills | Status: DC

## 2019-03-20 MED FILL — VICTOZA 18 MG/3 ML INJECT P: 18 | 30 days supply | Qty: 9 | Fill #0

## 2019-03-20 MED FILL — ATORVASTATIN CALCIUM 40 MG: 40 | 90 days supply | Qty: 90 | Fill #0

## 2019-03-20 MED FILL — LOSARTAN POTASSIUM 100 MG T: 100 | 90 days supply | Qty: 90 | Fill #0

## 2019-03-20 MED FILL — TRUEPLUS PEN NDL 31GX5/16: 31G X 8 MM | 90 days supply | Qty: 100 | Fill #0

## 2019-03-20 MED FILL — AMLODIPINE BESYLATE 10 MG T: 10 | 90 days supply | Qty: 90 | Fill #0

## 2019-03-20 MED FILL — JANUMET 50-1,000 MG TABLET: 50-1000 | 90 days supply | Qty: 180 | Fill #0

## 2019-03-20 MED FILL — HYDROCHLOROTHIAZIDE 25 MG T: 25 | 90 days supply | Qty: 90 | Fill #0

## 2019-03-21 LAB — CBC
Hematocrit: 47.9 % — ABNORMAL HIGH (ref 34.0–46.6)
Hemoglobin: 16.2 g/dL — ABNORMAL HIGH (ref 11.1–15.9)
MCH: 30 pg (ref 26.6–33.0)
MCHC: 33.8 g/dL (ref 31.5–35.7)
MCV: 89 fL (ref 79–97)
Platelets: 197 10*3/uL (ref 150–450)
RBC: 5.4 x10E6/uL — ABNORMAL HIGH (ref 3.77–5.28)
RDW: 13 % (ref 11.7–15.4)
WBC: 5 10*3/uL (ref 3.4–10.8)

## 2019-03-21 LAB — CMP14+EGFR
ALT: 36 IU/L — ABNORMAL HIGH (ref 0–32)
AST: 19 IU/L (ref 0–40)
Albumin/Globulin Ratio: 1.5 (ref 1.2–2.2)
Albumin: 4.4 g/dL (ref 3.8–4.8)
Alkaline Phosphatase: 83 IU/L (ref 39–117)
BUN/Creatinine Ratio: 16 (ref 9–23)
BUN: 13 mg/dL (ref 6–24)
Bilirubin Total: 0.5 mg/dL (ref 0.0–1.2)
CO2: 20 mmol/L (ref 20–29)
Calcium: 9.5 mg/dL (ref 8.7–10.2)
Chloride: 99 mmol/L (ref 96–106)
Creatinine, Ser: 0.81 mg/dL (ref 0.57–1.00)
GFR calc Af Amer: 98 mL/min/{1.73_m2} (ref >59)
GFR calc non Af Amer: 85 mL/min/{1.73_m2} (ref >59)
Globulin, Total: 3 g/dL (ref 1.5–4.5)
Glucose: 304 mg/dL — ABNORMAL HIGH (ref 65–99)
Potassium: 4.1 mmol/L (ref 3.5–5.2)
Sodium: 137 mmol/L (ref 134–144)
Total Protein: 7.4 g/dL (ref 6.0–8.5)

## 2019-03-21 LAB — LIPID PANEL
Chol/HDL Ratio: 3.2 ratio (ref 0.0–4.4)
Cholesterol, Total: 160 mg/dL (ref 100–199)
HDL: 50 mg/dL (ref >39)
LDL Calculated: 83 mg/dL (ref 0–99)
Triglycerides: 133 mg/dL (ref 0–149)
VLDL Cholesterol Cal: 27 mg/dL (ref 5–40)

## 2019-03-21 LAB — TSH: TSH: 3.2 u[IU]/mL (ref 0.450–4.500)

## 2019-03-22 ENCOUNTER — Other Ambulatory Visit: Payer: Self-pay | Admitting: Pharmacist

## 2019-03-22 DIAGNOSIS — E1165 Type 2 diabetes mellitus with hyperglycemia: Secondary | ICD-10-CM

## 2019-03-22 MED ORDER — CONTOUR NEXT TEST VI STRP: ORAL_STRIP | 11 refills | Status: DC

## 2019-03-22 MED ORDER — CONTOUR NEXT MONITOR W/DEVICE KIT: 1.0000 | PACK | Freq: Three times a day (TID) | 0 refills | Status: DC

## 2019-03-22 MED ORDER — MICROLET LANCETS MISC: 11 refills | Status: DC

## 2019-03-22 MED FILL — MICROLET LANCETS MISC: 30 days supply | Qty: 100 | Fill #0

## 2019-03-22 MED FILL — CONTOUR NEXT METER: W/DEVICE | 30 days supply | Qty: 1 | Fill #0

## 2019-03-22 MED FILL — CONTOUR NEXT STRIPS: 30 days supply | Qty: 100 | Fill #0

## 2019-03-22 NOTE — Progress Notes (Signed)
Insurance will cover [employer] supplies. I have removed OneTouch from her profile.

## 2019-03-23 ENCOUNTER — Other Ambulatory Visit: Payer: Self-pay | Admitting: Nurse Practitioner

## 2019-03-23 DIAGNOSIS — E039 Hypothyroidism, unspecified: Secondary | ICD-10-CM

## 2019-03-23 MED FILL — SYNTHROID 175 MCG TABLET: 175 | 30 days supply | Qty: 30 | Fill #0

## 2019-03-31 ENCOUNTER — Telehealth: Payer: Self-pay | Admitting: *Deleted

## 2019-03-31 NOTE — Telephone Encounter (Signed)
Patient is returning call for her lab results

## 2019-03-31 NOTE — Telephone Encounter (Signed)
Pt name and DOB verified. Patient aware of results and result note per Emilio Aspen   Patient advised to take victoza to pharmacy so she seek guidance on how to use it.  She states one of the two medications she is using causes diarrhea.   Please advise

## 2019-04-01 ENCOUNTER — Other Ambulatory Visit: Payer: Self-pay | Admitting: Nurse Practitioner

## 2019-04-01 MED ORDER — METFORMIN HCL 1000 MG PO TABS: 1000.0000 mg | ORAL_TABLET | Freq: Two times a day (BID) | ORAL | 3 refills | Status: DC

## 2019-04-01 NOTE — Telephone Encounter (Signed)
Have patient stop the janumet. She is to continue all other medications at this time including the [place]. I will also send metformin by itself as a new prescription.

## 2019-04-03 MED FILL — LOSARTAN-HCTZ 100-25 MG TAB: 100-25 | 30 days supply | Qty: 30 | Fill #5

## 2019-04-03 NOTE — Telephone Encounter (Signed)
Patient aware of changes to medication. Verbalized understanding.

## 2019-04-11 ENCOUNTER — Encounter: Payer: Self-pay | Admitting: Nurse Practitioner

## 2019-04-11 ENCOUNTER — Other Ambulatory Visit: Payer: Self-pay

## 2019-04-11 ENCOUNTER — Ambulatory Visit: Payer: Self-pay | Attending: Nurse Practitioner | Admitting: Nurse Practitioner

## 2019-04-11 VITALS — BP 130/82 | HR 92 | Temp 98.6°F | Ht 73.0 in | Wt 194.0 lb

## 2019-04-11 DIAGNOSIS — I10 Essential (primary) hypertension: Secondary | ICD-10-CM

## 2019-04-11 DIAGNOSIS — E785 Hyperlipidemia, unspecified: Secondary | ICD-10-CM

## 2019-04-11 DIAGNOSIS — Z1211 Encounter for screening for malignant neoplasm of colon: Secondary | ICD-10-CM

## 2019-04-11 DIAGNOSIS — E1165 Type 2 diabetes mellitus with hyperglycemia: Secondary | ICD-10-CM

## 2019-04-11 LAB — POCT GLYCOSYLATED HEMOGLOBIN (HGB A1C): Hemoglobin A1C: 12.4 % — AB (ref 4.0–5.6)

## 2019-04-11 LAB — GLUCOSE, POCT (MANUAL RESULT ENTRY): POC Glucose: 212 mg/dl — AB (ref 70–99)

## 2019-04-11 MED ORDER — MICROLET LANCETS MISC: 11 refills | Status: DC

## 2019-04-11 MED ORDER — INSULIN DETEMIR 100 UNIT/ML FLEXPEN: 20.0000 [IU] | PEN_INJECTOR | Freq: Every day | SUBCUTANEOUS | 11 refills | Status: DC

## 2019-04-11 MED FILL — metFORMIN HCL 1000 MG TABS: 1000 | 30 days supply | Qty: 60 | Fill #0

## 2019-04-11 MED FILL — LEVEMIR FLEXTOUCH 100 UNITS: 100 | 20 days supply | Qty: 6 | Fill #0

## 2019-04-11 NOTE — Progress Notes (Signed)
Assessment & Plan:  Diana Fleming was seen today for follow-up.  Diagnoses and all orders for this visit:  Type 2 diabetes mellitus with hyperglycemia, without long-term current use of insulin (HCC) -     Glucose (CBG) -     HgB A1c -     Ambulatory referral to Ophthalmology -     Insulin Detemir (LEVEMIR) 100 UNIT/ML Pen; Inject 20 Units into the skin at bedtime. -     Microlet Lancets MISC; Use to check blood sugar up to 3 times daily. Continue blood sugar control as discussed in office today, low carbohydrate diet, and regular physical exercise as tolerated, 150 minutes per week (30 min each day, 5 days per week, or 50 min 3 days per week). Keep blood sugar logs with fasting goal of 90-130 mg/dl, post prandial (after you eat) less than 180.  For Hypoglycemia: BS <60 and Hyperglycemia BS >400; contact the clinic ASAP. Annual eye exams and foot exams are recommended. Diabetes is poorly controlled. Advised patient to keep a fasting blood sugar log fast, 2 hours post lunch and bedtime which will be reviewed at the next office visit.   Colon cancer screening -     Fecal occult blood, imunochemical  Essential hypertension Continue all antihypertensives as prescribed.  Remember to bring in your blood pressure log with you for your follow up appointment.  DASH/Mediterranean Diets are healthier choices for HTN.    Hyperlipidemia, unspecified hyperlipidemia type INSTRUCTIONS: Work on a low fat, heart healthy diet and participate in regular aerobic exercise program by working out at least 150 minutes per week; 5 days a week-30 minutes per day. Avoid red meat, fried foods. junk foods, sodas, sugary drinks, unhealthy snacking, alcohol and smoking.  Drink at least 48oz of water per day and monitor your carbohydrate intake daily.      Patient has been counseled on age-appropriate routine health concerns for screening and prevention. These are reviewed and up-to-date. Referrals have been placed  accordingly. Immunizations are up-to-date or declined.    Subjective:   Chief Complaint  Patient presents with  . Follow-up    Pt. is here for diabetes follow up and meter check. Pt. brought her blood sugar meter with her    HPI [name] 53 y.o. female presents to office today for DM and HTN. She has been non adherent in the past with taking her medications due to cost.   DM TYPE 2 Current medications: victoza 1.8 mg daily, metformin 1000 mg BID. SHe has her meter here with her today with average readings: 240-300s. Will need to start long acting insulin based on A1c today of 12.4. She endorses hyperglycemic symptoms of neuropathy in her feet. Declines gabapentin today. Her diet is not consistently controlled in regard to sugars/carbs intake. Weight is down likely from [place] as she reports decreased appetite.  Lab Results  Component Value Date   HGBA1C 12.4 (A) 04/11/2019   Lab Results  Component Value Date   HGBA1C 11.2 (A) 08/17/2018   HTN Current medications: losartan 100 mg daily, HCTZ 25 mg daily and amlodipine 10 mg daily. Blood pressure is well controlled. She does not monitor at home.  BP Readings from Last 3 Encounters:  04/11/19 130/82  08/17/18 126/85  07/05/18 (!) 151/97   HPL LDL not at goal <70. Current statin lipitor 40 mg daily. Denies any statin intolerance.  Lab Results  Component Value Date   LDLCALC 83 03/20/2019    Hypothyroidism She denies fatigue,  weight changes, heat/cold intolerance, bowel/skin changes or CVS symptoms. Taking synthroid 175 mcg daily as prescribed.  Lab Results  Component Value Date   TSH 3.200 03/20/2019     Review of Systems  Constitutional: Negative for fever, malaise/fatigue and weight loss.  HENT: Negative.  Negative for nosebleeds.   Eyes: Negative.  Negative for blurred vision, double vision and photophobia.  Respiratory: Negative.  Negative for cough and shortness of breath.   Cardiovascular: Negative.   Negative for chest pain, palpitations and leg swelling.  Gastrointestinal: Negative.  Negative for heartburn, nausea and vomiting.  Musculoskeletal: Negative.  Negative for myalgias.  Neurological: Negative.  Negative for dizziness, focal weakness, seizures and headaches.  Psychiatric/Behavioral: Negative.  Negative for suicidal ideas.    Past Medical History:  Diagnosis Date  . Diabetes mellitus, type 2 ([place])   . Dyslipidemia   . 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 Hypertension   . Hypothyroidism     Past Surgical History:  Procedure Laterality Date  . DILATION AND CURETTAGE, DIAGNOSTIC / THERAPEUTIC  2002  . Radioiodine ablation    . TUBAL LIGATION      Family History  Problem Relation Age of Onset  . Cancer Mother   . Thyroid disease Mother   . Heart attack Maternal Grandfather   . Heart attack Maternal Grandmother     Social History Reviewed with no changes to be made today.   Outpatient Medications Prior to Visit  Medication Sig Dispense Refill  . amLODipine (NORVASC) 10 MG tablet Take 1 tablet (10 mg total) by mouth daily. 90 tablet 0  . atorvastatin (LIPITOR) 40 MG tablet Take 1 tablet (40 mg total) by mouth daily at 6 PM. 90 tablet 1  . Blood Glucose Monitoring Suppl (CONTOUR NEXT MONITOR) w/Device KIT 1 kit by Does not apply route 3 (three) times daily. 1 kit 0  . glucose blood (CONTOUR NEXT TEST) test strip Use as instructed to check blood sugar up to 3 times daily. 100 each 11  . hydrochlorothiazide (HYDRODIURIL) 25 MG tablet Take 1 tablet (25 mg total) by mouth daily. 90 tablet 0  . Insulin Pen Needle (B-D UF III MINI PEN NEEDLES) 31G X 5 MM MISC Use as instructed. Check blood glucose levels 1-2 times per day. 100 each 1  . liraglutide (VICTOZA) 18 MG/3ML SOPN Inject 0.3 mLs (1.8 mg total) into the skin daily. 5 pen 3  . losartan (COZAAR) 100 MG tablet Take 1 tablet (100 mg total) by mouth daily. 90 tablet 0  . metFORMIN (GLUCOPHAGE) 1000 MG  tablet Take 1 tablet (1,000 mg total) by mouth 2 (two) times daily with a meal. 180 tablet 3  . SYNTHROID 175 MCG tablet Take 1 tablet (175 mcg total) by mouth daily. 30 tablet 2  . valACYclovir (VALTREX) 500 MG tablet Take 1 tablet (500 mg total) by mouth 2 (two) times daily. 14 tablet 0  . Microlet Lancets MISC Use to check blood sugar up to 3 times daily. 100 each 11   No facility-administered medications prior to visit.     No Known Allergies     Objective:    BP 130/82 (BP Location: Left Arm, Patient Position: Sitting, Cuff Size: Normal)   Pulse 92   Temp 98.6 F (37 C) (Oral)   Ht 6' 1"  (1.854 m)   Wt 194 lb (88 kg)   LMP 12/05/2012   SpO2 98%   BMI 25.60 kg/m  Wt Readings from Last 3  Encounters:  04/11/19 194 lb (88 kg)  08/17/18 200 lb 12.8 oz (91.1 kg)  07/05/18 202 lb (91.6 kg)    Physical Exam Vitals signs and nursing note reviewed.  Constitutional:      Appearance: She is well-developed.  HENT:     Head: Normocephalic and atraumatic.  Neck:     Musculoskeletal: Normal range of motion.  Cardiovascular:     Rate and Rhythm: Normal rate and regular rhythm.     Heart sounds: Normal heart sounds. No murmur. No friction rub. No gallop.   Pulmonary:     Effort: Pulmonary effort is normal. No tachypnea or respiratory distress.     Breath sounds: Normal breath sounds. No decreased breath sounds, wheezing, rhonchi or rales.  Chest:     Chest wall: No tenderness.  Abdominal:     General: Bowel sounds are normal.     Palpations: Abdomen is soft.  Musculoskeletal: Normal range of motion.  Skin:    General: Skin is warm and dry.  Neurological:     Mental Status: She is alert and oriented to person, place, and time.     Coordination: Coordination normal.  Psychiatric:        Behavior: Behavior normal. Behavior is cooperative.        Thought Content: Thought content normal.        Judgment: Judgment normal.          Patient has been counseled extensively  about nutrition and exercise as well as the importance of adherence with medications and regular follow-up. The patient was given clear instructions to go to ER or return to medical center if symptoms don't improve, worsen or new problems develop. The patient verbalized understanding.   Follow-up: Return in about 6 weeks (around 05/23/2019) for [place] then 3 months with me.   Gildardo Pounds, FNP-BC [facility], [place]   04/11/2019, 10:37 AM

## 2019-04-25 ENCOUNTER — Other Ambulatory Visit: Payer: Self-pay | Admitting: Nurse Practitioner

## 2019-04-25 DIAGNOSIS — Z1231 Encounter for screening mammogram for malignant neoplasm of breast: Secondary | ICD-10-CM

## 2019-04-26 MED FILL — SYNTHROID 175 MCG TABLET: 175 | 30 days supply | Qty: 30 | Fill #1

## 2019-05-07 ENCOUNTER — Other Ambulatory Visit: Payer: Self-pay | Admitting: Nurse Practitioner

## 2019-05-11 ENCOUNTER — Encounter: Payer: Self-pay | Admitting: Cardiovascular Disease

## 2019-05-11 ENCOUNTER — Ambulatory Visit (INDEPENDENT_AMBULATORY_CARE_PROVIDER_SITE_OTHER): Payer: Self-pay | Admitting: Cardiovascular Disease

## 2019-05-11 ENCOUNTER — Other Ambulatory Visit: Payer: Self-pay

## 2019-05-11 VITALS — BP 122/78 | HR 86 | Temp 98.2°F | Ht 73.0 in | Wt 199.0 lb

## 2019-05-11 DIAGNOSIS — R0789 Other chest pain: Secondary | ICD-10-CM

## 2019-05-11 DIAGNOSIS — R42 Dizziness and giddiness: Secondary | ICD-10-CM

## 2019-05-11 DIAGNOSIS — I10 Essential (primary) hypertension: Secondary | ICD-10-CM

## 2019-05-11 DIAGNOSIS — E7800 Pure hypercholesterolemia, unspecified: Secondary | ICD-10-CM

## 2019-05-11 HISTORY — DX: Essential (primary) hypertension: I10

## 2019-05-11 HISTORY — DX: Dizziness and giddiness: R42

## 2019-05-11 HISTORY — DX: Pure hypercholesterolemia, unspecified: E78.00

## 2019-05-11 NOTE — Patient Instructions (Signed)
Medication Instructions:  Your physician recommends that you continue on your current medications as directed. Please refer to the Current Medication list given to you today.  If you need a refill on your cardiac medications before your next appointment, please call your pharmacy.   Lab work: NONE  Testing/Procedures: NONE   Follow-Up: At [facility], you and your health needs are our priority.  As part of our continuing mission to provide you with exceptional heart care, we have created designated Provider Care Teams.  These Care Teams include your primary Cardiologist (physician) and Advanced Practice Providers (APPs -  Physician Assistants and Nurse Practitioners) who all work together to provide you with the care you need, when you need it. You will need a follow up appointment in 12 months.  Please call our office 2 months in advance to schedule this appointment.  You may see DR [facility] or one of the following Advanced Practice Providers on your designated Care Team:   Kerin Ransom, PA-C Roby Lofts, [place] . Sande Rives, PA-C  Any Other Special Instructions Will Be Listed Below (If Applicable).  YOUR BLOOD PRESSURE GOAL IS BELOW 130/80

## 2019-05-11 NOTE — Progress Notes (Signed)
Cardiology Office Note   Date:  05/11/2019   ID:  Diana Fleming 1966-03-06, MRN [id number]  PCP:  Diana Pounds, NP  Cardiologist:   Diana Latch, MD   No chief complaint on file.     History of Present Illness: Diana Fleming is a 53 y.o. female with hypertension, hyperlipidemia and DM  who presents for follow-up.  She was initially seen in the hospital 02/02/2018 in the setting of hypertensive urgency.  She also had exertional chest pain.  However that improved with blood pressure control.    She underwent a coronary CT-a on 02/02/2018 that revealed a calcium score of 0 and no coronary disease.  Echo at that time revealed LVEF 60 to 65% with grade 1 diastolic dysfunction.  She had aortic valve sclerosis without stenosis.  She had been off her antihypertensives at the time.  These were restarted at discharge.  She followed up with Diana Ferries, PA, on 6/20 6-19 and was doing well.  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  The episode lasted for a few minutes and recurred the following day.  She took an aspirin which seemed to help.  She was bending over and standing up quickly.  She felt lightheaded but had no chest pain or shortness of breath.  Seh stays active by walking daily.  She walks her dog and has no exertional chest pain or shortness of breath.  She sometimes notices that her feet swell to the ankle at times.  She can sometimes see the ridges from her sock.  She has no orthopnea or PND.  She tries to check her BP at home but doesn't think her machine is reliable.     Past Medical History:  Diagnosis Date  . Diabetes mellitus, type 2 ([place])   . Dizziness 05/11/2019  . Dyslipidemia   . Essential hypertension 05/11/2019  . 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 Hypertension   . Hypothyroidism   . Pure hypercholesterolemia 05/11/2019    Past Surgical History:  Procedure Laterality Date  . DILATION AND  CURETTAGE, DIAGNOSTIC / THERAPEUTIC  2002  . Radioiodine ablation    . TUBAL LIGATION       Current Outpatient Medications  Medication Sig Dispense Refill  . amLODipine (NORVASC) 10 MG tablet Take 1 tablet (10 mg total) by mouth daily. 90 tablet 0  . atorvastatin (LIPITOR) 40 MG tablet Take 1 tablet (40 mg total) by mouth daily at 6 PM. 90 tablet 1  . Blood Glucose Monitoring Suppl (CONTOUR NEXT MONITOR) w/Device KIT 1 kit by Does not apply route 3 (three) times daily. 1 kit 0  . glucose blood (CONTOUR NEXT TEST) test strip Use as instructed to check blood sugar up to 3 times daily. 100 each 11  . hydrochlorothiazide (HYDRODIURIL) 25 MG tablet Take 1 tablet (25 mg total) by mouth daily. 90 tablet 0  . Insulin Detemir (LEVEMIR) 100 UNIT/ML Pen Inject 20 Units into the skin at bedtime. 15 mL 11  . Insulin Pen Needle (B-D UF III MINI PEN NEEDLES) 31G X 5 MM MISC Use as instructed. Check blood glucose levels 1-2 times per day. 100 each 1  . liraglutide (VICTOZA) 18 MG/3ML SOPN Inject 0.3 mLs (1.8 mg total) into the skin daily. 5 pen 3  . losartan (COZAAR) 100 MG tablet Take 1 tablet (100 mg total) by mouth daily. 90 tablet 0  . metFORMIN (GLUCOPHAGE) 1000  MG tablet Take 1 tablet (1,000 mg total) by mouth 2 (two) times daily with a meal. 180 tablet 3  . Microlet Lancets MISC Use to check blood sugar up to 3 times daily. 100 each 11  . SYNTHROID 175 MCG tablet Take 1 tablet (175 mcg total) by mouth daily. 30 tablet 2  . valACYclovir (VALTREX) 500 MG tablet Take 1 tablet (500 mg total) by mouth 2 (two) times daily. 14 tablet 0   No current facility-administered medications for this visit.     Allergies:   Patient has no known allergies.    Social History:  The patient  reports that she has never smoked. She has never used smokeless tobacco. She reports that she does not drink alcohol or use drugs.   Family History:  The patient's family history includes Cancer in her mother; Heart attack in  her maternal grandfather and maternal grandmother; Thyroid disease in her mother.    ROS:  Please see the history of present illness.   Otherwise, review of systems are positive for none.   All other systems are reviewed and negative.    PHYSICAL EXAM: VS:  BP 122/78   Pulse 86   Temp 98.2 F (36.8 C) (Temporal)   Ht 6' 1"  (1.854 m)   Wt 199 lb (90.3 kg)   LMP 12/05/2012   SpO2 98%   BMI 26.25 kg/m  , BMI Body mass index is 26.25 kg/m. GENERAL:  Well appearing HEENT:  Pupils equal round and reactive, fundi not visualized, oral mucosa unremarkable NECK:  No jugular venous distention, waveform within normal limits, carotid upstroke brisk and symmetric, no bruits, no thyromegaly LYMPHATICS:  No cervical adenopathy LUNGS:  Clear to auscultation bilaterally HEART:  RRR.  PMI not displaced or sustained,S1 and S2 within normal limits, no S3, no S4, no clicks, no rubs, no murmurs ABD:  Flat, positive bowel sounds normal in frequency in pitch, no bruits, no rebound, no guarding, no midline pulsatile mass, no hepatomegaly, no splenomegaly EXT:  2 plus pulses throughout, no edema, no cyanosis no clubbing SKIN:  No rashes no nodules NEURO:  Cranial nerves II through XII grossly intact, motor grossly intact throughout PSYCH:  Cognitively intact, oriented to person place and time   EKG:  EKG is ordered today. The ekg ordered today demonstrates sinus rhythm.  Rate 86 bpm.  Nonspecific ST changes.  Coronary CT-A 02/02/18: IMPRESSION: 1. Coronary artery calcium score 0 Agatston units, suggesting low risk for future cardiac events.  2.  No plaque or stenosis noted in the coronary tree.  ECHO: 02/01/2018 - Left ventricle: The cavity size was normal. Wall thickness was increased in a pattern of mild LVH. Systolic function was normal. The estimated ejection fraction was in the range of 60% to 65%. Doppler parameters are consistent with abnormal left ventricular relaxation (grade 1  diastolic dysfunction). The E/e&' ratio is between 8-15, suggesting indeterminate LV filling pressure. - Aortic valve: Sclerosis without stenosis. There was no regurgitation. - Left atrium: The atrium was normal in size. - Tricuspid valve: There was trivial regurgitation. - Pulmonary arteries: PA peak pressure: 17 mm Hg (S). - Inferior vena cava: The vessel was normal in size. The respirophasic diameter changes were in the normal range (>= 50%), consistent with normal central venous pressure.  Impressions:  - LVEF 60-65%, mild LVH, normal wall motion, grade 1 DD, indeterminate LV filling pressure, aortic valve sclerosis, normal LA size, trivial TR, RVSP 17 mmHg, normal IVC.   Recent Labs:  03/20/2019: ALT 36; BUN 13; Creatinine, Ser 0.81; Hemoglobin 16.2; Platelets 197; Potassium 4.1; Sodium 137; TSH 3.200    Lipid Panel    Component Value Date/Time   CHOL 160 03/20/2019 0945   TRIG 133 03/20/2019 0945   HDL 50 03/20/2019 0945   CHOLHDL 3.2 03/20/2019 0945   CHOLHDL 5.0 01/31/2018 1845   VLDL 44 (H) 01/31/2018 1845   LDLCALC 83 03/20/2019 0945      Wt Readings from Last 3 Encounters:  05/11/19 199 lb (90.3 kg)  04/11/19 194 lb (88 kg)  08/17/18 200 lb 12.8 oz (91.1 kg)      ASSESSMENT AND PLAN:  # Essential hypertension: Blood pressure is very well-controlled.  Continue amlodipine, hydrochlorothiazide, and losartan.  # Hyperlipidemia: Lipids are much better controlled.  LDL decreased from 180 to 83.  Continue atorvastatin.   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  She was outside and was warm at the time.  I suspect that this was due to orthostasis and her blood pressure medication.  She will continue to monitor and if she does not have recurrent symptoms we will not make any changes.   Current medicines are reviewed at length with the patient today.  The patient does  not have concerns regarding medicines.  The following changes have been made:  no change  Labs/ tests ordered today include:   Orders Placed This Encounter  Procedures  . EKG 12-Lead     Disposition:   FU with Caylin Raby C. Oval Linsey, MD, [facility] in 1 year.      Signed, Chibuikem Thang C. Oval Linsey, MD, [facility]  05/11/2019 1:06 PM    [facility]

## 2019-05-16 ENCOUNTER — Other Ambulatory Visit: Payer: Self-pay | Admitting: Nurse Practitioner

## 2019-05-16 DIAGNOSIS — I10 Essential (primary) hypertension: Secondary | ICD-10-CM

## 2019-05-16 MED FILL — CONTOUR NEXT STRIPS: 30 days supply | Qty: 100 | Fill #1

## 2019-05-16 MED FILL — LOSARTAN-HCTZ 100-25 MG TAB: 100-25 | 30 days supply | Qty: 30 | Fill #0

## 2019-05-23 ENCOUNTER — Other Ambulatory Visit: Payer: Self-pay

## 2019-05-23 ENCOUNTER — Ambulatory Visit: Payer: Self-pay | Attending: Nurse Practitioner | Admitting: Pharmacist

## 2019-05-23 DIAGNOSIS — E1165 Type 2 diabetes mellitus with hyperglycemia: Secondary | ICD-10-CM

## 2019-05-23 LAB — GLUCOSE, POCT (MANUAL RESULT ENTRY): POC Glucose: 282 mg/dl — AB (ref 70–99)

## 2019-05-23 NOTE — Progress Notes (Signed)
S:    PCP: Zelda   No chief complaint on file.  Patient arrives in good spirits. Presents for diabetes evaluation, education, and management. Patient was referred and last seen by Primary Care Provider on 04/11/19. Levemir 20 units nightly was added.  Patient with hx of non-compliance d/t cost.   Family/Social History:  FHx: MI (MGM, MGF), thyroid disease (mother) Tobacco: never smoker Alcohol: denies intake  Insurance coverage/medication affordability: self pay  Patient denies adherence with medications. Has not started Levemir (patient does not like needles), reports taking metformin once daily.  Current diabetes medications include: Levemir 20 units qHS, Victoza 1.8 mg daily, metformin 1000 mg BID Current hypertension medications include: amlodipine 10 mg, Hyzaar 100-25 mg daily Current hyperlipidemia medications include: atorvastatin 40 mg daily  Patient denies hypoglycemic events.  Patient reported dietary habits: -Tries to be adherent with diet but eats what she can get d/t unemployment. She eats fruit when craving sweets (grapes). Drinks water. Patient-reported exercise habits:  - Walks ~1 mile a day   Patient denies polyuria. Pt reports polydipsia.  Patient reports neuropathy. Patient reports visual changes. Patient reports self foot exams.     O:  Physical Exam   ROS   Lab Results  Component Value Date   HGBA1C 12.4 (A) 04/11/2019   There were no vitals filed for this visit.  Lipid Panel     Component Value Date/Time   CHOL 160 03/20/2019 0945   TRIG 133 03/20/2019 0945   HDL 50 03/20/2019 0945   CHOLHDL 3.2 03/20/2019 0945   CHOLHDL 5.0 01/31/2018 1845   VLDL 44 (H) 01/31/2018 1845   LDLCALC 83 03/20/2019 0945   Home fasting CBG: usually lower; 181 - 200s  2 hour post-prandial/random CBG: 200s - 300s. Highest 376.  Clinical ASCVD: No  The 10-year ASCVD risk score Denman George DC Jr., et al., 2013) is: 6.6%   Values used to calculate the score:    Age: 53 years     Sex: Female     Is Non-Hispanic African American: Yes     Diabetic: Yes     Tobacco smoker: No     Systolic Blood Pressure: 122 mmHg     Is BP treated: Yes     HDL Cholesterol: 50 mg/dL     Total Cholesterol: 160 mg/dL   A/P: Diabetes longstanding currently uncontrolled. Patient is able to verbalize appropriate hypoglycemia management plan. Patient is not adherent with medications. Control is suboptimal due to medication non-adherence and dietary indiscretion. -Continue Victoza 1.8 mg daily -Start taking Metformin 1000 mg BID instead of once daily -Start using Levemir 20 units at bedtime -Extensively discussed pathophysiology of DM, recommended lifestyle interventions, dietary effects on glycemic control. -Counseled on s/sx of and management of hypoglycemia. -Next A1C anticipated 07/2019.  -Urine Microalbumin today in clinic.  ASCVD risk - primary prevention in patient with DM. Last LDL is not controlled. ASCVD risk score is not >20%  - Continue high intensity statin. -Continued atorvastatin 40 mg daily.   HM: PNA, tetanus, and influenza due.  - Fluarix given today in clinic.  Written patient instructions provided.  Total time in face to face counseling 30 minutes.   Follow up Pharmacist Clinic Visit in 2-3 weeks.     Patient seen with: Craig Guess PharmD Candidate  Class of 2022 [employer]   Butch Penny, PharmD, CPP Clinical Pharmacist [facility] [phone]  Written patient instructions provided.  Total time  in face to face counseling 30 minutes.   Follow up Pharmacist Clinic Visit in 2-3 weeks.

## 2019-05-23 NOTE — Patient Instructions (Signed)
Thank you for coming to see me today. Please do the following:  1. Start taking Levemir 20 units at bedtime. 2. Continue Victoza 1.8 mg daily.  3. Take metformin twice a day.  4. Continue checking blood sugars at home. 5. Continue making the lifestyle changes we've discussed together during our visit. Diet and exercise play a significant role in improving your blood sugars.  6. Follow-up with me in 2-3 weeks.    Hypoglycemia or low blood sugar:   Low blood sugar can happen quickly and may become an emergency if not treated right away.   While this shouldn't happen often, it can be brought upon if you skip a meal or do not eat enough. Also, if your insulin or other diabetes medications are dosed too high, this can cause your blood sugar to go to low.   Warning signs of low blood sugar include: 1. Feeling shaky or dizzy 2. Feeling weak or tired  3. Excessive hunger 4. Feeling anxious or upset  5. Sweating even when you aren't exercising  What to do if I experience low blood sugar? 1. Check your blood sugar with your meter. If lower than 70, proceed to step 2.  2. Treat with 3-4 glucose tablets or 3 packets of regular sugar. If these aren't around, you can try hard candy. Yet another option would be to drink 4 ounces of fruit juice or 6 ounces of REGULAR soda.  3. Re-check your sugar in 15 minutes. If it is still below 70, do what you did in step 2 again. If has come back up, go ahead and eat a snack or small meal at this time.

## 2019-05-31 MED FILL — SYNTHROID 175 MCG TABLET: 175 | 30 days supply | Qty: 30 | Fill #2

## 2019-06-07 ENCOUNTER — Other Ambulatory Visit: Payer: Self-pay

## 2019-06-07 ENCOUNTER — Ambulatory Visit: Payer: Self-pay | Attending: Family Medicine | Admitting: Pharmacist

## 2019-06-07 DIAGNOSIS — E1165 Type 2 diabetes mellitus with hyperglycemia: Secondary | ICD-10-CM

## 2019-06-07 LAB — GLUCOSE, POCT (MANUAL RESULT ENTRY): POC Glucose: 308 mg/dl — AB (ref 70–99)

## 2019-06-07 MED ORDER — TRULICITY 0.75 MG/0.5ML ~~LOC~~ SOAJ: 0.7500 mg | SUBCUTANEOUS | 2 refills | Status: DC

## 2019-06-07 MED FILL — TRULICITY 0.75 MG/0.5 ML PE: 0.75 | 30 days supply | Qty: 2 | Fill #0

## 2019-06-07 NOTE — Progress Notes (Signed)
S:    PCP: Zelda   No chief complaint on file.  Patient arrives in good spirits. Presents for diabetes evaluation, education, and management. Patient was referred and last seen by Primary Care Provider on 04/11/19.  I saw her on 9/22 and encouraged compliance with BID dosing of metformin and nightly Levemir.   Today, pt is frustrated with her insulin. States, "it doesn't work for me; it made me throw up". I asked her several times regarding adherence to her other medications and she was unable to provide me with a direct answer.   Patient with hx of non-compliance d/t cost.   Family/Social History:  FHx: MI (MGM, MGF), thyroid disease (mother) Tobacco: never smoker Alcohol: denies intake  Insurance coverage/medication affordability: self pay  Patient denies adherence with medications.  Current diabetes medications include: Levemir 20 units qHS (not taking), Victoza 1.8 mg daily, metformin 1000 mg BID Current hypertension medications include: amlodipine 10 mg, Hyzaar 100-25 mg daily Current hyperlipidemia medications include: atorvastatin 40 mg daily  Patient denies hypoglycemic events.  Patient reported dietary habits: -Tries to be adherent with diet but eats what she can get d/t unemployment. She eats fruit when craving sweets (grapes). Drinks water. Patient-reported exercise habits:  - Walks ~1 mile a day   Patient denies polyuria. Pt reports polydipsia.  Patient reports neuropathy. Patient reports visual changes. Patient reports self foot exams.     O:  POCT glucose: 308  Lab Results  Component Value Date   HGBA1C 12.4 (A) 04/11/2019   There were no vitals filed for this visit.  Lipid Panel     Component Value Date/Time   CHOL 160 03/20/2019 0945   TRIG 133 03/20/2019 0945   HDL 50 03/20/2019 0945   CHOLHDL 3.2 03/20/2019 0945   CHOLHDL 5.0 01/31/2018 1845   VLDL 44 (H) 01/31/2018 1845   LDLCALC 83 03/20/2019 0945   Home fasting CBG: usually lower; 181  - 200s  2 hour post-prandial/random CBG: 200s - 300s. Highest 376.  Clinical ASCVD: No  The 10-year ASCVD risk score Mikey Bussing DC Jr., et al., 2013) is: 6.6%   Values used to calculate the score:     Age: 53 years     Sex: Female     Is Non-Hispanic African American: Yes     Diabetic: Yes     Tobacco smoker: No     Systolic Blood Pressure: 425 mmHg     Is BP treated: Yes     HDL Cholesterol: 50 mg/dL     Total Cholesterol: 160 mg/dL   A/P: Diabetes longstanding currently uncontrolled. Patient is able to verbalize appropriate hypoglycemia management plan. Patient is not adherent with medications. Control is suboptimal due to medication non-adherence and dietary indiscretion.  During our encounter, pt became frustrated stating, "I don't like how you talk to me like a child". I attempted to reconcile, but she seems unwilling to work with me. I did talk to her regarding medication compliance, and she is open to trying a weekly injection v daily. She is adamantly refusing insulin despite hyperglycemia and my counsel regarding long and short-term effects of uncontrolled diabetes.   -Stop Victoza -Stop Levemir -Continue taking Metformin 1000 mg BID instead of once daily -Start Trulicity 9.56 mg weekly.  -Extensively discussed pathophysiology of DM, recommended lifestyle interventions, dietary effects on glycemic control. -Counseled on s/sx of and management of hypoglycemia. -Next A1C anticipated 07/2019.   ASCVD risk - primary prevention in patient with DM. Last LDL  is not controlled. ASCVD risk score is not >20%  - Continue high intensity statin. -Continued atorvastatin 40 mg daily.   HM: PNA, tetanus due.  - Will address at follow-up  Written patient instructions provided.  Total time in face to face counseling 30 minutes.   Follow up Pharmacist Clinic Visit in 2-3 weeks.     Patient seen with: Craig Guess PharmD Candidate  Class of 2022 [employer]    Butch Penny, PharmD, CPP Clinical Pharmacist [facility] [phone]  Written patient instructions provided.  Total time in face to face counseling 30 minutes.   Follow up Pharmacist Clinic Visit in 2-3 weeks.

## 2019-06-07 NOTE — Patient Instructions (Signed)
Thank you for coming to see me today. Please do the following:  1. Stop Victoza, stop levemir.  2. Start Trulicity on Mondays. 3. Continue metformin twice a day.  4. Continue checking blood sugars at home. It's really important that you record these and bring these in to your next doctor's appointment. If you get in readings above 500 or lower than 70, call me or the clinic to let your doctor know. See below on how to treat low blood sugar.  5. Continue making the lifestyle changes we've discussed together during our visit. Diet and exercise play a significant role in improving your blood sugars.  6. Follow-up with me at the end of this month.    Hypoglycemia or low blood sugar:   Low blood sugar can happen quickly and may become an emergency if not treated right away.   While this shouldn't happen often, it can be brought upon if you skip a meal or do not eat enough. Also, if your insulin or other diabetes medications are dosed too high, this can cause your blood sugar to go to low.   Warning signs of low blood sugar include: 1. Feeling shaky or dizzy 2. Feeling weak or tired  3. Excessive hunger 4. Feeling anxious or upset  5. Sweating even when you aren't exercising  What to do if I experience low blood sugar? 1. Check your blood sugar with your meter. If lower than 70, proceed to step 2.  2. Treat with 3-4 glucose tablets or 3 packets of regular sugar. If these aren't around, you can try hard candy. Yet another option would be to drink 4 ounces of fruit juice or 6 ounces of REGULAR soda.  3. Re-check your sugar in 15 minutes. If it is still below 70, do what you did in step 2 again. If has come back up, go ahead and eat a snack or small meal at this time.

## 2019-06-08 ENCOUNTER — Encounter: Payer: Self-pay | Admitting: Pharmacist

## 2019-06-09 ENCOUNTER — Ambulatory Visit
Admission: RE | Admit: 2019-06-09 | Discharge: 2019-06-09 | Disposition: A | Discharge status: Home / Self Care | Payer: No Typology Code available for payment source | Source: Ambulatory Visit | Attending: Nurse Practitioner | Admitting: Nurse Practitioner

## 2019-06-09 ENCOUNTER — Other Ambulatory Visit: Payer: Self-pay

## 2019-06-09 DIAGNOSIS — Z1231 Encounter for screening mammogram for malignant neoplasm of breast: Secondary | ICD-10-CM

## 2019-06-27 LAB — FECAL OCCULT BLOOD, IMMUNOCHEMICAL

## 2019-06-28 ENCOUNTER — Ambulatory Visit: Payer: Self-pay | Admitting: Pharmacist

## 2019-07-04 ENCOUNTER — Other Ambulatory Visit: Payer: Self-pay | Admitting: Family Medicine

## 2019-07-04 DIAGNOSIS — E039 Hypothyroidism, unspecified: Secondary | ICD-10-CM

## 2019-07-04 MED FILL — SYNTHROID 175 MCG TABLET: 175 | 30 days supply | Qty: 30 | Fill #0

## 2019-07-06 ENCOUNTER — Telehealth: Payer: Self-pay | Admitting: Nurse Practitioner

## 2019-07-06 NOTE — Telephone Encounter (Signed)
Left message on patient voicemail to return call.   UTR patient with daughter's contact listing.

## 2019-07-06 NOTE — Telephone Encounter (Signed)
Patient daughter came in saying that the metformin is not working for the patient. When patient takes the metformin she is angry all the time and she stopped taking it for a couple of days and she was doing fine. Patient would like to know if she could be switched to another medication. Patient phone is not working and she can be reached at [phone]. Please f/u

## 2019-07-07 NOTE — Telephone Encounter (Signed)
Left message on voicemail to return call.

## 2019-07-12 ENCOUNTER — Ambulatory Visit: Payer: Self-pay | Admitting: Nurse Practitioner

## 2019-07-17 ENCOUNTER — Ambulatory Visit: Payer: BLUE CROSS/BLUE SHIELD | Attending: Nurse Practitioner | Admitting: Nurse Practitioner

## 2019-07-17 ENCOUNTER — Other Ambulatory Visit: Payer: Self-pay

## 2019-07-17 ENCOUNTER — Encounter: Payer: Self-pay | Admitting: Nurse Practitioner

## 2019-07-17 VITALS — BP 144/88 | HR 85 | Temp 98.7°F | Ht 73.0 in | Wt 192.0 lb

## 2019-07-17 DIAGNOSIS — E1165 Type 2 diabetes mellitus with hyperglycemia: Secondary | ICD-10-CM

## 2019-07-17 DIAGNOSIS — D582 Other hemoglobinopathies: Secondary | ICD-10-CM

## 2019-07-17 DIAGNOSIS — I10 Essential (primary) hypertension: Secondary | ICD-10-CM

## 2019-07-17 LAB — POCT GLYCOSYLATED HEMOGLOBIN (HGB A1C): Hemoglobin A1C: 11.3 % — AB (ref 4.0–5.6)

## 2019-07-17 LAB — GLUCOSE, POCT (MANUAL RESULT ENTRY)
POC Glucose: 333 mg/dl — AB (ref 70–99)
POC Glucose: 348 mg/dl — AB (ref 70–99)

## 2019-07-17 MED ORDER — BD PEN NEEDLE MINI U/F 31G X 5 MM MISC: 1 refills | Status: DC

## 2019-07-17 MED ORDER — METFORMIN HCL 1000 MG PO TABS: 1000.0000 mg | ORAL_TABLET | Freq: Two times a day (BID) | ORAL | 3 refills | Status: DC

## 2019-07-17 MED ORDER — INSULIN ASPART 100 UNIT/ML ~~LOC~~ SOLN
10.0000 [IU] | Freq: Once | SUBCUTANEOUS | Status: AC
  Administered 2019-07-17: 10 [IU] via SUBCUTANEOUS

## 2019-07-17 MED ORDER — CANAGLIFLOZIN 300 MG PO TABS: 300.0000 mg | ORAL_TABLET | Freq: Every day | ORAL | 3 refills | Status: DC

## 2019-07-17 MED ORDER — INSULIN GLARGINE 100 UNIT/ML SOLOSTAR PEN: 20.0000 [IU] | PEN_INJECTOR | Freq: Every day | SUBCUTANEOUS | 11 refills | Status: DC

## 2019-07-17 MED FILL — INVOKANA 300 MG TABLET: 300 | 30 days supply | Qty: 30 | Fill #0

## 2019-07-17 MED FILL — LANTUS SOLOSTAR 100 UNITS/M: 100 | 75 days supply | Qty: 15 | Fill #0

## 2019-07-17 MED FILL — TRUEPLUS 5-BEVEL PEN NEEDLE: 31G X 5 MM | 50 days supply | Qty: 100 | Fill #0

## 2019-07-17 NOTE — Progress Notes (Signed)
Assessment & Plan:  Diana Fleming was seen today for follow-up.  Diagnoses and all orders for this visit:  Type 2 diabetes mellitus with hyperglycemia, unspecified whether long term insulin use (HCC) -     Glucose (CBG) -     HgB A1c -     CMP14+EGFR -     insulin aspart (novoLOG) injection 10 Units Diabetes is poorly controlled. Advised patient to keep a fasting blood sugar log fast, 2 hours post lunch and bedtime which will be reviewed at the next office visit. Continue blood sugar control as discussed in office today, low carbohydrate diet, and regular physical exercise as tolerated, 150 minutes per week (30 min each day, 5 days per week, or 50 min 3 days per week). Keep blood sugar logs with fasting goal of 90-130 mg/dl, post prandial (after you eat) less than 180.  For Hypoglycemia: BS <60 and Hyperglycemia BS >400; contact the clinic ASAP. Annual eye exams and foot exams are recommended.   Abnormal hemoglobin (HCC) -     CBC  Essential hypertension -     CMP14+EGFR Continue all antihypertensives as prescribed.  Remember to bring in your blood pressure log with you for your follow up appointment.  DASH/Mediterranean Diets are healthier choices for HTN.    Patient has been counseled on age-appropriate routine health concerns for screening and prevention. These are reviewed and up-to-date. Referrals have been placed accordingly. Immunizations are up-to-date or declined.    Subjective:   Chief Complaint  Patient presents with  . Follow-up    Pt. is here for 3 months follow up for diabetes.    HPI SHIRLENE ANDAYA 53 y.o. female presents to office today for follow up.  DM TYPE 2 History of poor compliance. Current medications include: Trulicity 7.12 mg weekly, metformin 1000 mg twice daily.  She has been prescribed a statin and ARB.  She is overdue for eye exam.  She is uninsured and still has not applied for the financial assistance program here at the office. States her husband  will not add her to his insurance and will not give her his tax statements to apply for the [place] program here. Medications tried in the past Levemir 20 units (nausea), Victoza 1.8 mg. She was noncompliant with both. States she does not like needles. Blood sugar is over 300 today. A1c 11.3.  She had a meeting with the pharmacist on 10-7: I did talk to her regarding medication compliance, and she is open to trying a weekly injection v daily. She is adamantly refusing insulin despite hyperglycemia and my counsel regarding long and short-term effects of uncontrolled diabetes.  She stopped taking metformin and states she was told by her daughter to stop it. She is not sure why but is willing to restart. Diet is poor. States she is eating "survival foods." Not monitoring her blood glucose levels daily. Glucometer readings: 280-500s. She endorses hyperglycemic symptoms of peripheral neuropathy. Overdue for eye exam. She is aware that without dietary and exercise modifications it will be very difficult to lower her A1c without insulin. Agreeable to lantus at this time.  Lab Results  Component Value Date   HGBA1C 11.3 (A) 07/17/2019   Lab Results  Component Value Date   HGBA1C 12.4 (A) 04/11/2019    Essential Hypertension Well controlled. Taking amlodipine 10 mg daily, losartan-hctz 100-25 mg daily.  She does not monitor her blood pressure at home. Denies chest pain, shortness of breath, palpitations, lightheadedness, dizziness, headaches or BLE edema.  BP Readings from Last 3 Encounters:  07/17/19 (!) 144/88  05/11/19 122/78  04/11/19 130/82    Hyperlipidemia Currently prescribed atorvastatin 40 mg daily.  Denies any statin intolerance or myalgias.  LDL not at goal of less than 80. Lab Results  Component Value Date   LDLCALC 83 03/20/2019     Depression Very tearful. I feel like I'm all alone. Mother passed 5 years ago. Not a lot of emotional support from family. Husband not supportive. She has  2 adult daughters. One just can't help and the other "is too busy living her own life". She declines referral to our social worker for counseling and also declines antidepressants today. She denies any thoughts of self harm. She has no transportation and depends on others to get her back and forth.   Review of Systems  Constitutional: Negative for fever, malaise/fatigue and weight loss.  HENT: Negative.  Negative for nosebleeds.   Eyes: Negative.  Negative for blurred vision, double vision and photophobia.  Respiratory: Negative.  Negative for cough and shortness of breath.   Cardiovascular: Negative.  Negative for chest pain, palpitations and leg swelling.  Gastrointestinal: Negative.  Negative for heartburn, nausea and vomiting.  Musculoskeletal: Negative.  Negative for myalgias.  Neurological: Positive for sensory change (peripheral neuropathy). Negative for dizziness, focal weakness, seizures and headaches.  Psychiatric/Behavioral: Positive for depression. Negative for suicidal ideas. The patient is nervous/anxious.     Past Medical History:  Diagnosis Date  . Diabetes mellitus, type 2 ([place])   . Dizziness 05/11/2019  . Dyslipidemia   . Essential hypertension 05/11/2019  . 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 Hypertension   . Hypothyroidism   . Pure hypercholesterolemia 05/11/2019    Past Surgical History:  Procedure Laterality Date  . DILATION AND CURETTAGE, DIAGNOSTIC / THERAPEUTIC  2002  . Radioiodine ablation    . TUBAL LIGATION      Family History  Problem Relation Age of Onset  . Cancer Mother   . Thyroid disease Mother   . Heart attack Maternal Grandfather   . Heart attack Maternal Grandmother     Social History Reviewed with no changes to be made today.   Outpatient Medications Prior to Visit  Medication Sig Dispense Refill  . amLODipine (NORVASC) 10 MG tablet Take 1 tablet (10 mg total) by mouth daily. 90 tablet 0  . atorvastatin (LIPITOR) 40 MG  tablet Take 1 tablet (40 mg total) by mouth daily at 6 PM. 90 tablet 1  . Blood Glucose Monitoring Suppl (CONTOUR NEXT MONITOR) w/Device KIT 1 kit by Does not apply route 3 (three) times daily. 1 kit 0  . glucose blood (CONTOUR NEXT TEST) test strip Use as instructed to check blood sugar up to 3 times daily. 100 each 11  . Insulin Pen Needle (B-D UF III MINI PEN NEEDLES) 31G X 5 MM MISC Use as instructed. Check blood glucose levels 1-2 times per day. 100 each 1  . losartan-hydrochlorothiazide (HYZAAR) 100-25 MG tablet Take 1 tablet by mouth daily. Stop single HCTZ and single losartan. 30 tablet 2  . Microlet Lancets MISC Use to check blood sugar up to 3 times daily. 100 each 11  . SYNTHROID 175 MCG tablet TAKE 1 TABLET (175 MCG TOTAL) BY MOUTH DAILY. 30 tablet 2  . Dulaglutide (TRULICITY) 2.12 YQ/8.2NO SOPN Inject 0.75 mg into the skin once a week. (Patient not taking: Reported on 07/17/2019) 2 mL 2  . metFORMIN (GLUCOPHAGE) 1000 MG tablet Take  1 tablet (1,000 mg total) by mouth 2 (two) times daily with a meal. (Patient not taking: Reported on 07/17/2019) 180 tablet 3  . valACYclovir (VALTREX) 500 MG tablet Take 1 tablet (500 mg total) by mouth 2 (two) times daily. 14 tablet 0   No facility-administered medications prior to visit.     No Known Allergies     Objective:    BP (!) 144/88 (BP Location: Left Arm, Patient Position: Sitting, Cuff Size: Normal)   Pulse 85   Temp 98.7 F (37.1 C) (Oral)   Ht 6' 1"  (1.854 m)   Wt 192 lb (87.1 kg)   LMP 12/05/2012   SpO2 98%   BMI 25.33 kg/m  Wt Readings from Last 3 Encounters:  07/17/19 192 lb (87.1 kg)  05/11/19 199 lb (90.3 kg)  04/11/19 194 lb (88 kg)    Physical Exam Vitals signs and nursing note reviewed.  Constitutional:      Appearance: She is well-developed.  HENT:     Head: Normocephalic and atraumatic.  Neck:     Musculoskeletal: Normal range of motion.  Cardiovascular:     Rate and Rhythm: Normal rate and regular rhythm.      Heart sounds: Normal heart sounds. No murmur. No friction rub. No gallop.   Pulmonary:     Effort: Pulmonary effort is normal. No tachypnea or respiratory distress.     Breath sounds: Normal breath sounds. No decreased breath sounds, wheezing, rhonchi or rales.  Chest:     Chest wall: No tenderness.  Abdominal:     General: Bowel sounds are normal.     Palpations: Abdomen is soft.  Musculoskeletal: Normal range of motion.  Skin:    General: Skin is warm and dry.  Neurological:     Mental Status: She is alert and oriented to person, place, and time.     Coordination: Coordination normal.  Psychiatric:        Mood and Affect: Mood is depressed.        Speech: Speech normal.        Behavior: Behavior normal. Behavior is cooperative.        Thought Content: Thought content normal. Thought content does not include homicidal or suicidal ideation. Thought content does not include homicidal or suicidal plan.        Judgment: Judgment normal.         Patient has been counseled extensively about nutrition and exercise as well as the importance of adherence with medications and regular follow-up. The patient was given clear instructions to go to ER or return to medical center if symptoms don't improve, worsen or new problems develop. The patient verbalized understanding.   Follow-up: Return in about 3 months (around 10/17/2019).   Gildardo Pounds, FNP-BC [facility] [place], [place]   07/17/2019, 9:30 AM

## 2019-07-18 ENCOUNTER — Other Ambulatory Visit: Payer: Self-pay | Admitting: Nurse Practitioner

## 2019-07-18 DIAGNOSIS — D751 Secondary polycythemia: Secondary | ICD-10-CM

## 2019-07-18 LAB — CBC
Hematocrit: 46.3 % (ref 34.0–46.6)
Hemoglobin: 16.1 g/dL — ABNORMAL HIGH (ref 11.1–15.9)
MCH: 31.4 pg (ref 26.6–33.0)
MCHC: 34.8 g/dL (ref 31.5–35.7)
MCV: 90 fL (ref 79–97)
Platelets: 222 10*3/uL (ref 150–450)
RBC: 5.13 x10E6/uL (ref 3.77–5.28)
RDW: 12.4 % (ref 11.7–15.4)
WBC: 4.2 10*3/uL (ref 3.4–10.8)

## 2019-07-18 LAB — CMP14+EGFR
ALT: 20 IU/L (ref 0–32)
AST: 15 IU/L (ref 0–40)
Albumin/Globulin Ratio: 1.4 (ref 1.2–2.2)
Albumin: 4.5 g/dL (ref 3.8–4.9)
Alkaline Phosphatase: 98 IU/L (ref 39–117)
BUN/Creatinine Ratio: 16 (ref 9–23)
BUN: 13 mg/dL (ref 6–24)
Bilirubin Total: 0.4 mg/dL (ref 0.0–1.2)
CO2: 23 mmol/L (ref 20–29)
Calcium: 9.9 mg/dL (ref 8.7–10.2)
Chloride: 96 mmol/L (ref 96–106)
Creatinine, Ser: 0.82 mg/dL (ref 0.57–1.00)
GFR calc Af Amer: 96 mL/min/{1.73_m2} (ref >59)
GFR calc non Af Amer: 83 mL/min/{1.73_m2} (ref >59)
Globulin, Total: 3.3 g/dL (ref 1.5–4.5)
Glucose: 313 mg/dL — ABNORMAL HIGH (ref 65–99)
Potassium: 3.5 mmol/L (ref 3.5–5.2)
Sodium: 137 mmol/L (ref 134–144)
Total Protein: 7.8 g/dL (ref 6.0–8.5)

## 2019-07-18 IMAGING — CR DG CHEST 2V
2 series · 2 of 2 positions shown · non-contrast
Comparison: None.

CLINICAL DATA: Chest pain.

EXAM:
CHEST - 2 VIEW

[chest pa]
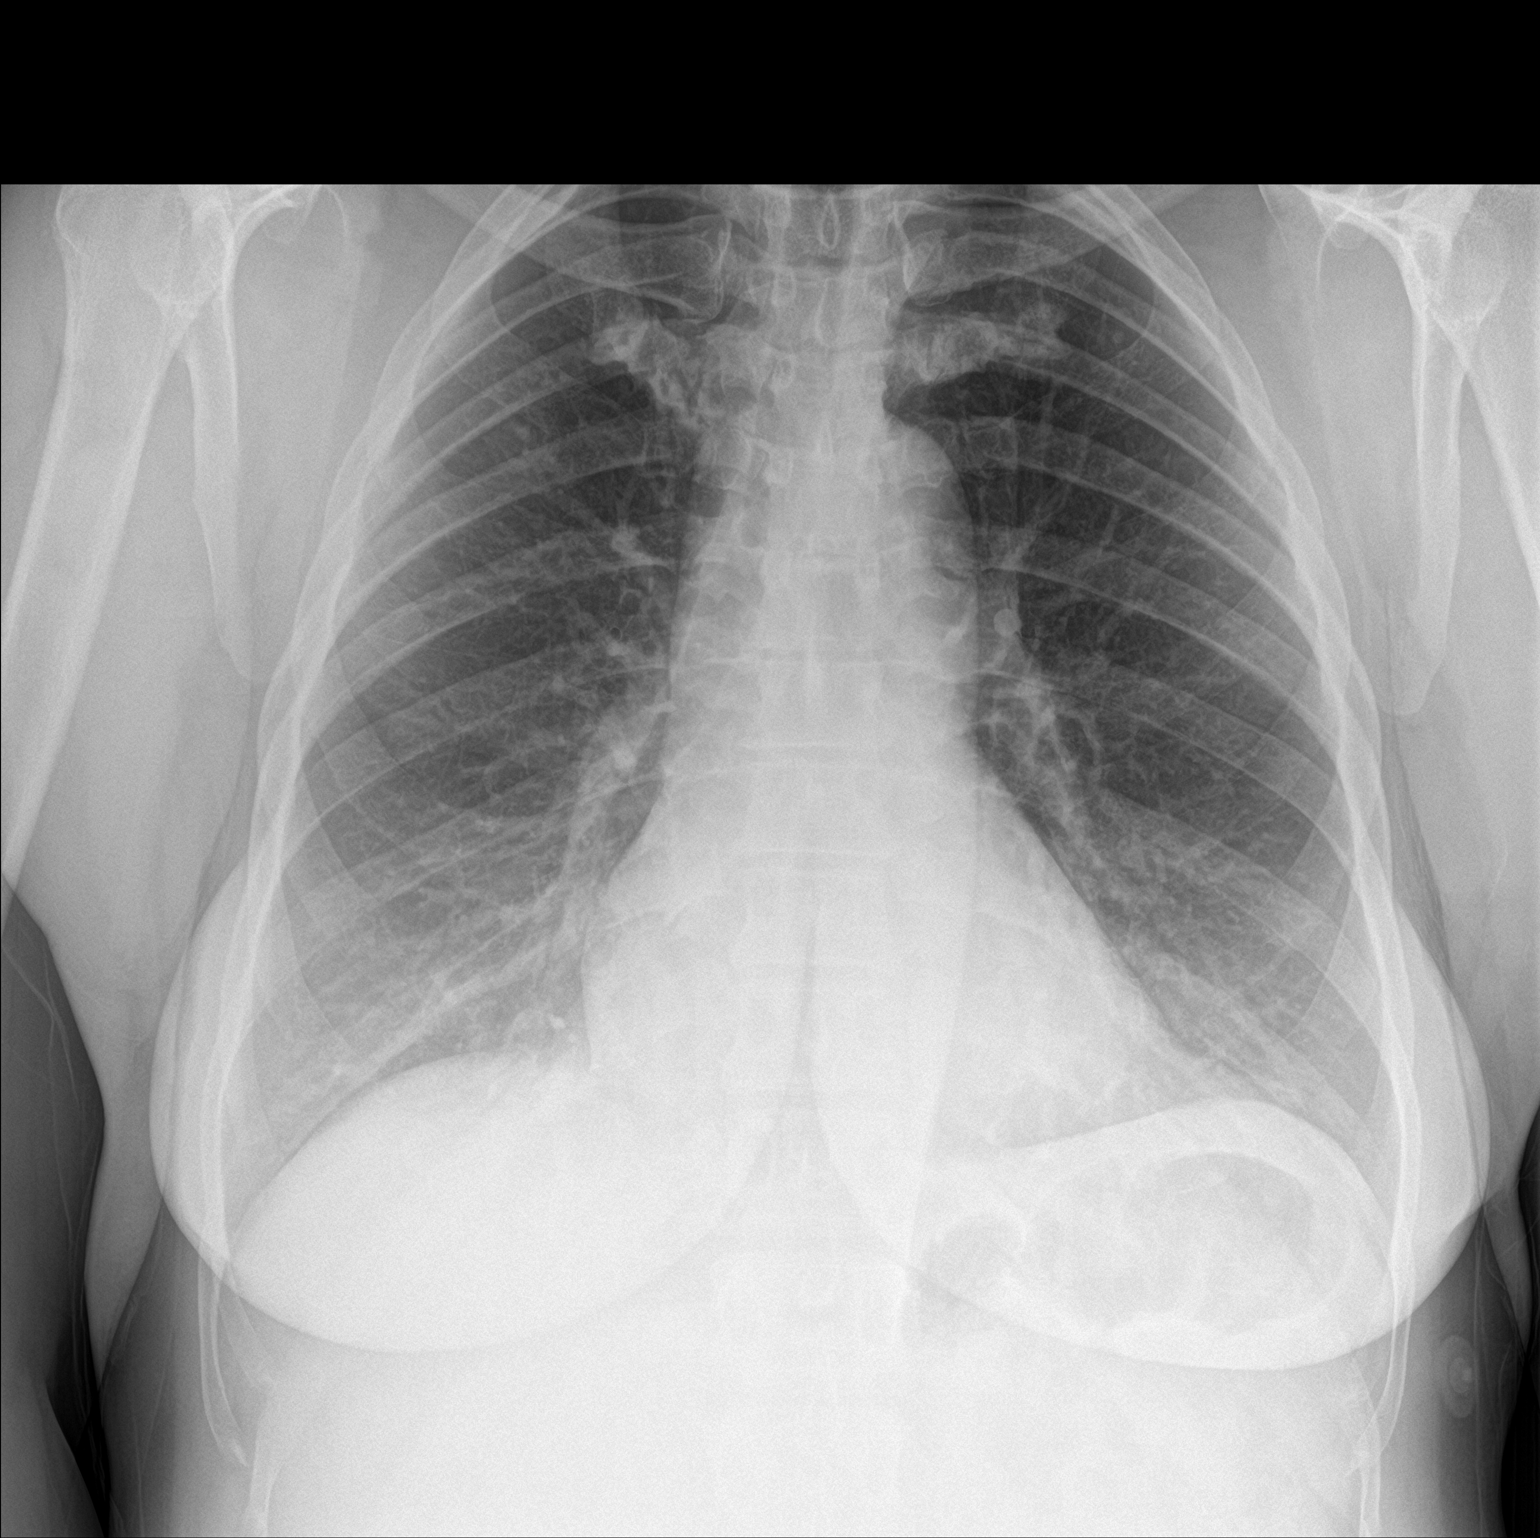

[chest lat]
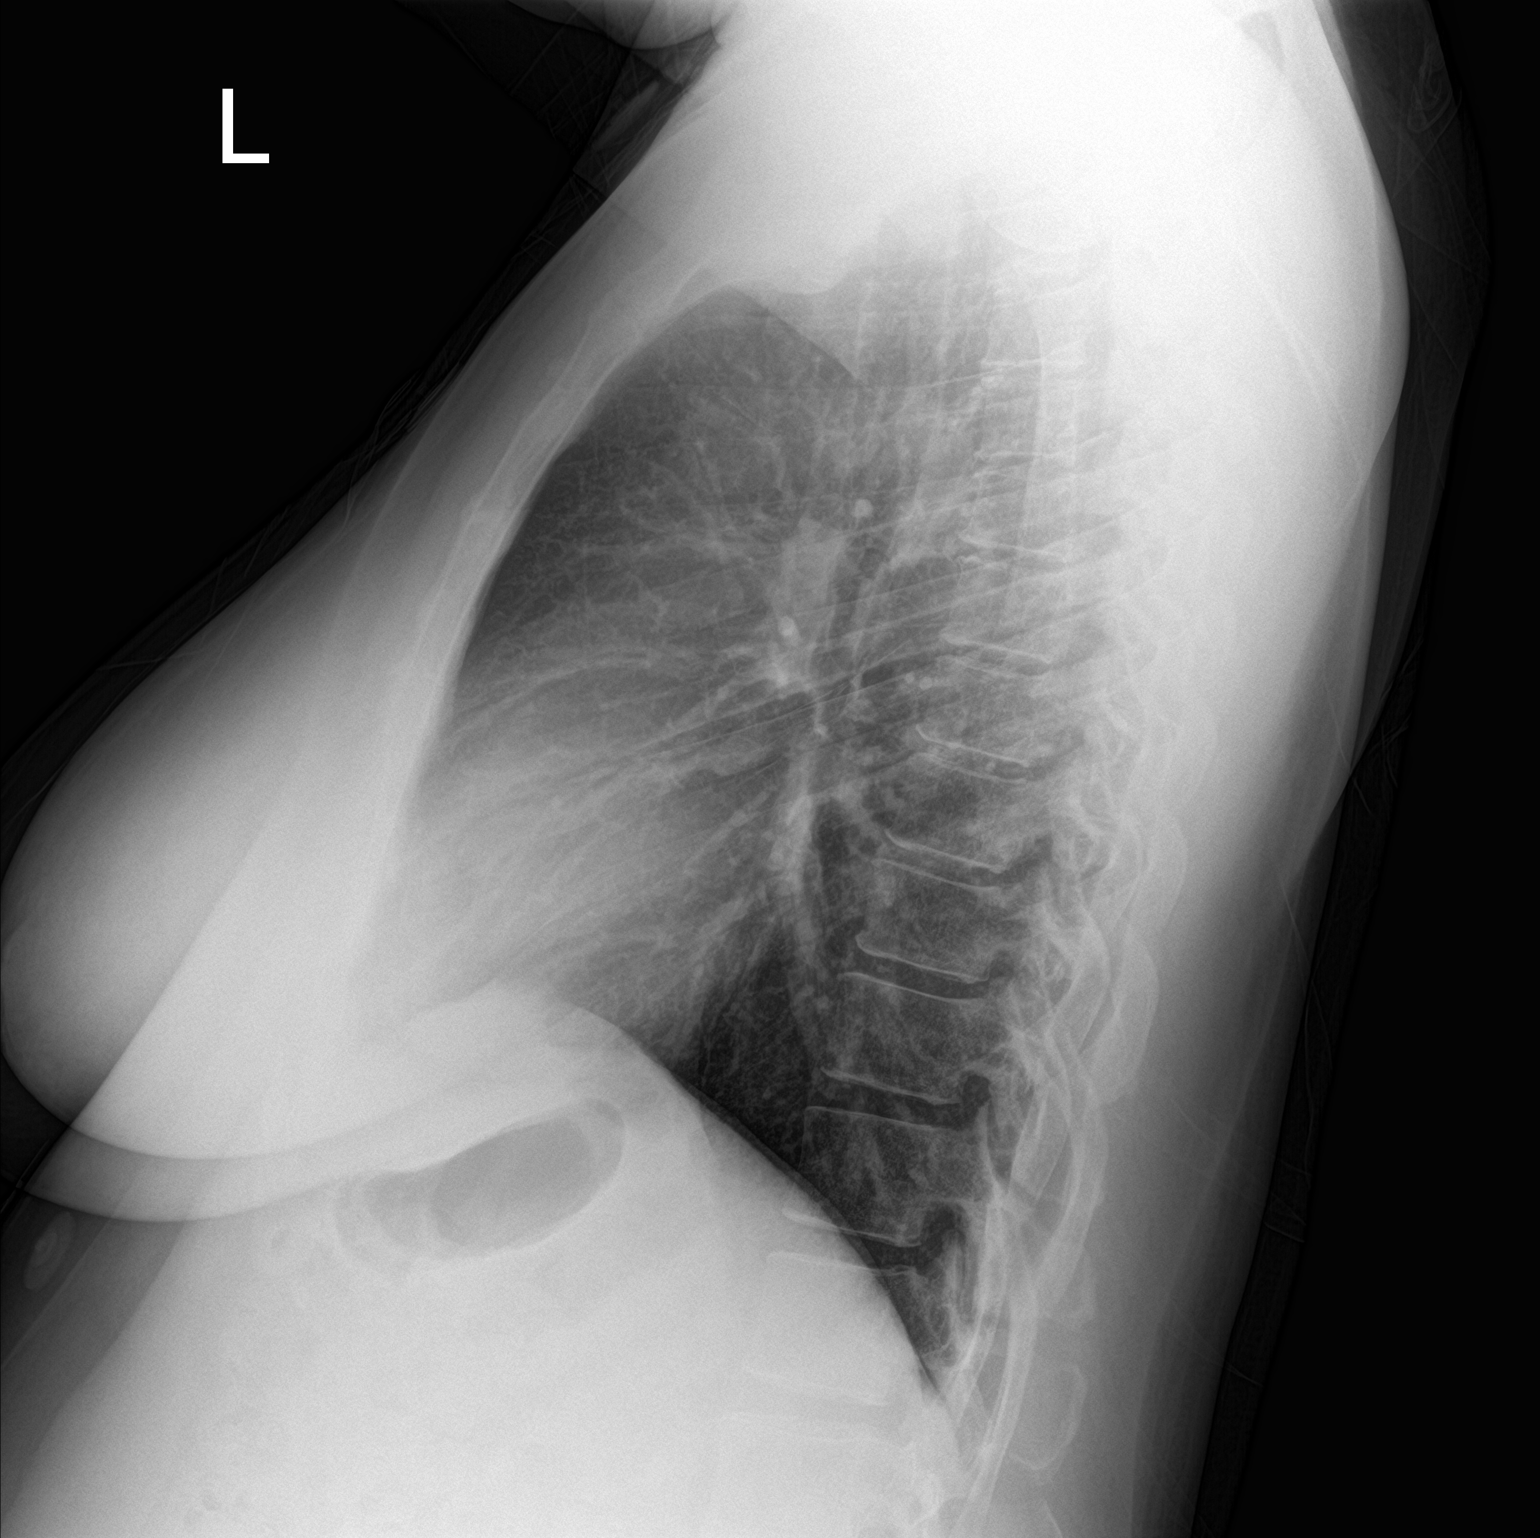

[2 of 2 positions shown; findings below may reference images not displayed]

FINDINGS: The heart size and mediastinal contours are within normal limits.
Both lungs are clear. No pneumothorax or pleural effusion is noted.
The visualized skeletal structures are unremarkable.
IMPRESSION: No active cardiopulmonary disease.

## 2019-08-08 MED FILL — SYNTHROID 175 MCG TABLET: 175 | 30 days supply | Qty: 30 | Fill #1

## 2019-09-08 MED FILL — SYNTHROID 175 MCG TABLET: 175 | 30 days supply | Qty: 30 | Fill #2

## 2019-09-22 ENCOUNTER — Other Ambulatory Visit: Payer: Self-pay | Admitting: Nurse Practitioner

## 2019-09-22 DIAGNOSIS — I10 Essential (primary) hypertension: Secondary | ICD-10-CM

## 2019-09-22 MED FILL — metFORMIN HCL 1000 MG TABS: 1000 | 30 days supply | Qty: 60 | Fill #0

## 2019-09-22 MED FILL — LOSARTAN-HCTZ 100-25 MG TAB: 100-25 | 30 days supply | Qty: 30 | Fill #1

## 2019-09-22 MED FILL — LANTUS SOLOSTAR 100 UNITS/M: 100 | 30 days supply | Qty: 6 | Fill #0

## 2019-09-22 MED FILL — AMLODIPINE BESYLATE 10 MG T: 10 | 30 days supply | Qty: 30 | Fill #0

## 2019-10-02 ENCOUNTER — Other Ambulatory Visit: Payer: Self-pay | Admitting: Family Medicine

## 2019-10-02 DIAGNOSIS — E039 Hypothyroidism, unspecified: Secondary | ICD-10-CM

## 2019-10-04 MED FILL — SYNTHROID 175 MCG TABLET: 175 | 30 days supply | Qty: 30 | Fill #0

## 2019-10-16 ENCOUNTER — Ambulatory Visit: Payer: Self-pay | Attending: Nurse Practitioner | Admitting: Nurse Practitioner

## 2019-10-16 ENCOUNTER — Other Ambulatory Visit: Payer: Self-pay

## 2019-11-03 MED FILL — metFORMIN HCL 1000 MG TABS: 1000 | 30 days supply | Qty: 60 | Fill #1

## 2019-11-03 MED FILL — SYNTHROID 175 MCG TABLET: 175 | 30 days supply | Qty: 30 | Fill #1

## 2019-11-03 MED FILL — AMLODIPINE BESYLATE 10 MG T: 10 | 30 days supply | Qty: 30 | Fill #1

## 2019-11-03 MED FILL — LOSARTAN-HCTZ 100-25 MG TAB: 100-25 | 30 days supply | Qty: 30 | Fill #2

## 2019-11-24 ENCOUNTER — Other Ambulatory Visit: Payer: Self-pay | Admitting: Pharmacist

## 2019-11-24 DIAGNOSIS — E1165 Type 2 diabetes mellitus with hyperglycemia: Secondary | ICD-10-CM

## 2019-11-24 MED ORDER — ONETOUCH VERIO REFLECT W/DEVICE KIT: 1.0000 | PACK | Freq: Three times a day (TID) | 0 refills | Status: DC

## 2019-11-24 MED ORDER — ONETOUCH VERIO VI STRP: ORAL_STRIP | 11 refills | Status: DC

## 2019-11-24 MED ORDER — ONETOUCH DELICA LANCETS 33G MISC: 11 refills | Status: DC

## 2019-11-24 MED FILL — ONETOUCH VERIO REFLECT W/DE: W/DEVICE | 30 days supply | Qty: 1 | Fill #0

## 2019-11-24 MED FILL — LANTUS SOLOSTAR 100 UNITS/M: 100 | 30 days supply | Qty: 6 | Fill #1

## 2019-11-24 MED FILL — ONETOUCH DELICA PLUS LANCET: 30 days supply | Qty: 100 | Fill #0

## 2019-11-24 MED FILL — ONE TOUCH VERIO TEST STRIP: 30 days supply | Qty: 100 | Fill #0

## 2019-12-11 ENCOUNTER — Other Ambulatory Visit: Payer: Self-pay | Admitting: Family Medicine

## 2019-12-11 DIAGNOSIS — I10 Essential (primary) hypertension: Secondary | ICD-10-CM

## 2019-12-11 MED FILL — metFORMIN HCL 1000 MG TABS: 1000 | 30 days supply | Qty: 60 | Fill #2

## 2019-12-11 MED FILL — SYNTHROID 175 MCG TABLET: 175 | 30 days supply | Qty: 30 | Fill #2

## 2019-12-11 MED FILL — AMLODIPINE BESYLATE 10 MG T: 10 | 30 days supply | Qty: 30 | Fill #2

## 2019-12-13 ENCOUNTER — Other Ambulatory Visit: Payer: Self-pay | Admitting: Family Medicine

## 2019-12-13 DIAGNOSIS — I10 Essential (primary) hypertension: Secondary | ICD-10-CM

## 2019-12-14 MED FILL — LOSARTAN-HCTZ 100-25 MG TAB: 100-25 | 30 days supply | Qty: 30 | Fill #0

## 2019-12-17 ENCOUNTER — Ambulatory Visit (HOSPITAL_COMMUNITY)
Admission: EM | Admit: 2019-12-17 | Discharge: 2019-12-17 | Disposition: A | Discharge status: Home / Self Care | Payer: Self-pay | Attending: Family Medicine | Admitting: Family Medicine

## 2019-12-17 ENCOUNTER — Encounter (HOSPITAL_COMMUNITY): Payer: Self-pay | Admitting: Family Medicine

## 2019-12-17 ENCOUNTER — Other Ambulatory Visit: Payer: Self-pay

## 2019-12-17 DIAGNOSIS — M5412 Radiculopathy, cervical region: Secondary | ICD-10-CM

## 2019-12-17 MED ORDER — PREDNISONE 20 MG PO TABS: ORAL_TABLET | ORAL | 0 refills | Status: DC

## 2019-12-17 MED ORDER — HYDROCODONE-ACETAMINOPHEN 5-325 MG PO TABS: 1.0000 | ORAL_TABLET | Freq: Four times a day (QID) | ORAL | 0 refills | Status: DC | PRN

## 2019-12-17 NOTE — ED Provider Notes (Signed)
[facility]    CSN: [id number] Arrival date & time: 12/17/19  1007      History   Chief Complaint Chief Complaint  Patient presents with  . Arm Pain    HPI Diana Fleming is a 54 y.o. female.   Initial [facility] patient visit  Pt present left arm pain, symptoms started on Wednesday. She is able to extend and bend her arm but its just very painful.  Pain started on Wednesday upon arising.  It seemed to be tolerable with aspirin until today.  No past h/o this.  Patient is diabetic with reasonable control.   No weakness in arm.  Distribution is in shoulder, forearm and index finger and thumb on the left.  No rash.  Pain is worse with abduction of shoulder.  Some slight numbness in left index finger.  Works at [employer].  No injury.  No neck pain now.      Past Medical History:  Diagnosis Date  . Diabetes mellitus, type 2 ([place])   . Dizziness 05/11/2019  . Dyslipidemia   . Essential hypertension 05/11/2019  . 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 Hypertension   . Hypothyroidism   . Pure hypercholesterolemia 05/11/2019    Patient Active Problem List   Diagnosis Date Noted  . Pure hypercholesterolemia 05/11/2019  . Dizziness 05/11/2019  . Essential hypertension 05/11/2019  . Chest pain 02/01/2018  . DM2 (diabetes mellitus, type 2) ([place]) 02/01/2018  . Hypothyroidism 02/01/2018    Past Surgical History:  Procedure Laterality Date  . DILATION AND CURETTAGE, DIAGNOSTIC / THERAPEUTIC  2002  . Radioiodine ablation    . TUBAL LIGATION      OB History   No obstetric history on file.      Home Medications    Prior to Admission medications   Medication Sig Start Date End Date Taking? Authorizing Provider  amLODipine (NORVASC) 10 MG tablet TAKE 1 TABLET (10 MG TOTAL) BY MOUTH DAILY. 09/22/19   Charlott Rakes, MD  atorvastatin (LIPITOR) 40 MG tablet Take 1 tablet (40 mg total) by mouth daily at 6 PM. 03/18/19   Gildardo Pounds, NP  Blood Glucose  Monitoring Suppl (ONETOUCH VERIO REFLECT) w/Device KIT 1 kit by Does not apply route in the morning, at noon, and at bedtime. Use as instructed to check blood sugar up to 3 times daily. E11.65 11/24/19   Charlott Rakes, MD  canagliflozin (INVOKANA) 300 MG TABS tablet Take 1 tablet (300 mg total) by mouth daily before breakfast. 07/17/19   Gildardo Pounds, NP  glucose blood (ONETOUCH VERIO) test strip Use as instructed to check blood sugar up to 3 times daily. E11.65 11/24/19   Charlott Rakes, MD  HYDROcodone-acetaminophen (NORCO) 5-325 MG tablet Take 1 tablet by mouth every 6 (six) hours as needed for moderate pain. 12/17/19   Robyn Haber, MD  Insulin Glargine (LANTUS) 100 UNIT/ML Solostar Pen Inject 20 Units into the skin daily at 10 pm. 07/17/19 08/16/19  Gildardo Pounds, NP  Insulin Pen Needle (B-D UF III MINI PEN NEEDLES) 31G X 5 MM MISC Use as instructed. Check blood glucose levels 1-2 times per day. 07/17/19   Gildardo Pounds, NP  losartan-hydrochlorothiazide (HYZAAR) 100-25 MG tablet Take 1 tablet by mouth daily. 12/14/19 01/13/20  Charlott Rakes, MD  metFORMIN (GLUCOPHAGE) 1000 MG tablet Take 1 tablet (1,000 mg total) by mouth 2 (two) times daily with a meal. 07/17/19   Gildardo Pounds, NP  Jonetta Speak  Lancets 33G MISC Use as instructed to check blood sugar up to 3 times daily. E11.65 11/24/19   Charlott Rakes, MD  predniSONE (DELTASONE) 20 MG tablet Two daily with food 12/17/19   Robyn Haber, MD  SYNTHROID 175 MCG tablet TAKE 1 TABLET (175 MCG TOTAL) BY MOUTH DAILY. 10/03/19   Charlott Rakes, MD  valACYclovir (VALTREX) 500 MG tablet Take 1 tablet (500 mg total) by mouth 2 (two) times daily. 05/11/18   Gildardo Pounds, NP    Family History Family History  Problem Relation Age of Onset  . Cancer Mother   . Thyroid disease Mother   . Heart attack Maternal Grandfather   . Heart attack Maternal Grandmother     Social History Social History   Tobacco Use  . Smoking status:  Never Smoker  . Smokeless tobacco: Never Used  Substance Use Topics  . Alcohol use: Never  . Drug use: Never     Allergies   Patient has no known allergies.   Review of Systems Review of Systems  Constitutional: Negative.   Musculoskeletal: Positive for myalgias.  Neurological: Negative.   All other systems reviewed and are negative.    Physical Exam Triage Vital Signs ED Triage Vitals [12/17/19 1029]  Enc Vitals Group     BP 139/85     Pulse Rate 87     Resp 18     Temp 98.2 F (36.8 C)     Temp Source Oral     SpO2 99 %     Weight      Height      Head Circumference      Peak Flow      Pain Score      Pain Loc      Pain Edu?      Excl. in [place]?    No data found.  Updated Vital Signs BP 139/85 (BP Location: Right Arm)   Pulse 87   Temp 98.2 F (36.8 C) (Oral)   Resp 18   LMP 12/05/2012   SpO2 99%    Physical Exam Vitals and nursing note reviewed.  Constitutional:      General: She is in acute distress.     Appearance: Normal appearance. She is normal weight. She is not ill-appearing or toxic-appearing.  Eyes:     Conjunctiva/sclera: Conjunctivae normal.  Cardiovascular:     Rate and Rhythm: Normal rate.  Pulmonary:     Effort: Pulmonary effort is normal.  Musculoskeletal:        General: No swelling, tenderness or signs of injury. Normal range of motion.     Cervical back: Normal range of motion and neck supple.  Skin:    General: Skin is warm and dry.  Neurological:     General: No focal deficit present.     Mental Status: She is alert and oriented to person, place, and time.     Sensory: Sensory deficit present.  Psychiatric:        Mood and Affect: Mood normal.        Behavior: Behavior normal.        Thought Content: Thought content normal.      UC Treatments / Results  Labs (all labs ordered are listed, but only abnormal results are displayed) Labs Reviewed - No data to display  EKG   Radiology No results  found.  Procedures Procedures (including critical care time)  Medications Ordered in UC Medications - No data to display  Initial Impression /  Assessment and Plan / UC Course  I have reviewed the triage vital signs and the nursing notes.  Pertinent labs & imaging results that were available during my care of the patient were reviewed by me and considered in my medical decision making (see chart for details).    Final Clinical Impressions(s) / UC Diagnoses   Final diagnoses:  Cervical radiculitis     Discharge Instructions     Return on Tuesday if pain persists.    ED Prescriptions    Medication Sig Dispense Auth. Provider   HYDROcodone-acetaminophen (NORCO) 5-325 MG tablet  (Status: Discontinued) Take 1 tablet by mouth every 6 (six) hours as needed for moderate pain. 12 tablet Robyn Haber, MD   predniSONE (DELTASONE) 20 MG tablet Two daily with food 10 tablet Robyn Haber, MD   HYDROcodone-acetaminophen (NORCO) 5-325 MG tablet Take 1 tablet by mouth every 6 (six) hours as needed for moderate pain. 12 tablet Robyn Haber, MD     I have reviewed the PDMP during this encounter.   Robyn Haber, MD 12/17/19 1045

## 2019-12-17 NOTE — ED Triage Notes (Signed)
Pt present left arm pain, symptoms started on Wednesday. She is able to extend and bend her arm but its just very painful.

## 2019-12-17 NOTE — Discharge Instructions (Addendum)
Return on Tuesday if pain persists.

## 2019-12-18 MED FILL — predniSONE 20 MG TABS: 20 | 5 days supply | Qty: 10 | Fill #0

## 2019-12-20 ENCOUNTER — Other Ambulatory Visit: Payer: Self-pay

## 2019-12-20 ENCOUNTER — Ambulatory Visit (INDEPENDENT_AMBULATORY_CARE_PROVIDER_SITE_OTHER): Payer: Self-pay

## 2019-12-20 ENCOUNTER — Ambulatory Visit (HOSPITAL_COMMUNITY)
Admission: EM | Admit: 2019-12-20 | Discharge: 2019-12-20 | Disposition: A | Discharge status: Home / Self Care | Payer: Self-pay | Attending: Family Medicine | Admitting: Family Medicine

## 2019-12-20 ENCOUNTER — Encounter (HOSPITAL_COMMUNITY): Payer: Self-pay

## 2019-12-20 DIAGNOSIS — M5030 Other cervical disc degeneration, unspecified cervical region: Secondary | ICD-10-CM

## 2019-12-20 MED ORDER — MELOXICAM 7.5 MG PO TABS: 15.0000 mg | ORAL_TABLET | Freq: Every day | ORAL | 0 refills | Status: DC

## 2019-12-20 MED ORDER — HYDROCODONE-ACETAMINOPHEN 5-325 MG PO TABS: 1.0000 | ORAL_TABLET | Freq: Four times a day (QID) | ORAL | 0 refills | Status: DC | PRN

## 2019-12-20 MED ORDER — KETOROLAC TROMETHAMINE 30 MG/ML IJ SOLN
30.0000 mg | Freq: Once | INTRAMUSCULAR | Status: AC
  Administered 2019-12-20: 30 mg via INTRAMUSCULAR

## 2019-12-20 MED ORDER — KETOROLAC TROMETHAMINE 30 MG/ML IJ SOLN
INTRAMUSCULAR | Status: AC
  Filled 2019-12-20: qty 1

## 2019-12-20 NOTE — ED Triage Notes (Signed)
Patient here to follow-up from 4/18 visit about left side arm pain.

## 2019-12-20 NOTE — Discharge Instructions (Signed)
Your x-ray did show some degenerative disc disease in the neck.  This is most likely the cause of the nerve inflammation and your symptoms and pain. Since the prednisone is increasing your blood sugars we can change that to meloxicam daily I am also prescribing hydrocodone for worsening pain to use as needed. Follow up as needed for continued or worsening symptoms

## 2019-12-21 NOTE — ED Provider Notes (Signed)
[facility]    CSN: [id number] Arrival date & time: 12/20/19  1050      History   Chief Complaint Chief Complaint  Patient presents with  . f/u arm pain    HPI Diana Fleming is a 54 y.o. female.   Patient is a 54 year old female with past medical history of dizziness, diabetes, dyslipidemia, essential hypertension, HSV, hypertension, herpes, hypertension, hypothyroidism.  She presents today with follow-up of left neck pain with radiation into left arm and numbness and tingling in fourth and fifth fingers.  Symptoms have been constant, waxing waning.  She was seen here a few days ago and treated with prednisone taper and given hydrocodone for more severe pain.  She never was able to pick up the hydrocodone.  Reporting that her symptoms have not improved with the prednisone.  Also reporting her blood sugars have been ranging in the 300s and she is concerned about that.  Describes the pain as sharp, stabbing at times.  Denies any injuries.  Mild weakness in the left arm.  ROS per HPI      Past Medical History:  Diagnosis Date  . Diabetes mellitus, type 2 ([place])   . Dizziness 05/11/2019  . Dyslipidemia   . Essential hypertension 05/11/2019  . 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 Hypertension   . Hypothyroidism   . Pure hypercholesterolemia 05/11/2019    Patient Active Problem List   Diagnosis Date Noted  . Pure hypercholesterolemia 05/11/2019  . Dizziness 05/11/2019  . Essential hypertension 05/11/2019  . Chest pain 02/01/2018  . DM2 (diabetes mellitus, type 2) ([place]) 02/01/2018  . Hypothyroidism 02/01/2018    Past Surgical History:  Procedure Laterality Date  . DILATION AND CURETTAGE, DIAGNOSTIC / THERAPEUTIC  2002  . Radioiodine ablation    . TUBAL LIGATION      OB History   No obstetric history on file.      Home Medications    Prior to Admission medications   Medication Sig Start Date End Date Taking? Authorizing Provider    amLODipine (NORVASC) 10 MG tablet TAKE 1 TABLET (10 MG TOTAL) BY MOUTH DAILY. 09/22/19   Charlott Rakes, MD  atorvastatin (LIPITOR) 40 MG tablet Take 1 tablet (40 mg total) by mouth daily at 6 PM. 03/18/19   Gildardo Pounds, NP  Blood Glucose Monitoring Suppl (ONETOUCH VERIO REFLECT) w/Device KIT 1 kit by Does not apply route in the morning, at noon, and at bedtime. Use as instructed to check blood sugar up to 3 times daily. E11.65 11/24/19   Charlott Rakes, MD  canagliflozin (INVOKANA) 300 MG TABS tablet Take 1 tablet (300 mg total) by mouth daily before breakfast. 07/17/19   Gildardo Pounds, NP  glucose blood (ONETOUCH VERIO) test strip Use as instructed to check blood sugar up to 3 times daily. E11.65 11/24/19   Charlott Rakes, MD  HYDROcodone-acetaminophen (NORCO/VICODIN) 5-325 MG tablet Take 1-2 tablets by mouth every 6 (six) hours as needed. 12/20/19   Loura Halt A, NP  Insulin Glargine (LANTUS) 100 UNIT/ML Solostar Pen Inject 20 Units into the skin daily at 10 pm. 07/17/19 08/16/19  Gildardo Pounds, NP  Insulin Pen Needle (B-D UF III MINI PEN NEEDLES) 31G X 5 MM MISC Use as instructed. Check blood glucose levels 1-2 times per day. 07/17/19   Gildardo Pounds, NP  losartan-hydrochlorothiazide (HYZAAR) 100-25 MG tablet Take 1 tablet by mouth daily. 12/14/19 01/13/20  Charlott Rakes, MD  meloxicam (MOBIC) 7.5  MG tablet Take 2 tablets (15 mg total) by mouth daily. 12/20/19   Loura Halt A, NP  metFORMIN (GLUCOPHAGE) 1000 MG tablet Take 1 tablet (1,000 mg total) by mouth 2 (two) times daily with a meal. 07/17/19   Gildardo Pounds, NP  OneTouch Delica Lancets 07W MISC Use as instructed to check blood sugar up to 3 times daily. E11.65 11/24/19   Charlott Rakes, MD  predniSONE (DELTASONE) 20 MG tablet Two daily with food 12/17/19   Robyn Haber, MD  SYNTHROID 175 MCG tablet TAKE 1 TABLET (175 MCG TOTAL) BY MOUTH DAILY. 10/03/19   Charlott Rakes, MD  valACYclovir (VALTREX) 500 MG tablet Take 1  tablet (500 mg total) by mouth 2 (two) times daily. 05/11/18   Gildardo Pounds, NP    Family History Family History  Problem Relation Age of Onset  . Cancer Mother   . Thyroid disease Mother   . Heart attack Maternal Grandfather   . Heart attack Maternal Grandmother     Social History Social History   Tobacco Use  . Smoking status: Never Smoker  . Smokeless tobacco: Never Used  Substance Use Topics  . Alcohol use: Never  . Drug use: Never     Allergies   Patient has no known allergies.   Review of Systems Review of Systems   Physical Exam Triage Vital Signs ED Triage Vitals  Enc Vitals Group     BP 12/20/19 1143 139/87     Pulse Rate 12/20/19 1143 93     Resp 12/20/19 1143 16     Temp 12/20/19 1143 98.1 F (36.7 C)     Temp Source 12/20/19 1143 Oral     SpO2 12/20/19 1143 95 %     Weight --      Height --      Head Circumference --      Peak Flow --      Pain Score 12/20/19 1142 6     Pain Loc --      Pain Edu? --      Excl. in [place]? --    No data found.  Updated Vital Signs BP 139/87 (BP Location: Right Arm)   Pulse 93   Temp 98.1 F (36.7 C) (Oral)   Resp 16   LMP 12/05/2012   SpO2 95%   Visual Acuity Right Eye Distance:   Left Eye Distance:   Bilateral Distance:    Right Eye Near:   Left Eye Near:    Bilateral Near:     Physical Exam Vitals and nursing note reviewed.  Constitutional:      General: She is not in acute distress.    Appearance: Normal appearance. She is not ill-appearing, toxic-appearing or diaphoretic.  HENT:     Head: Normocephalic.     Nose: Nose normal.  Eyes:     Conjunctiva/sclera: Conjunctivae normal.  Pulmonary:     Effort: Pulmonary effort is normal.  Musculoskeletal:     Cervical back: Neck supple. Tenderness present. Spinous process tenderness and muscular tenderness present. Decreased range of motion.     Comments: Grip strength mild decreased on the left.   Skin:    General: Skin is warm and dry.      Findings: No rash.  Neurological:     Mental Status: She is alert.  Psychiatric:        Mood and Affect: Mood normal.      UC Treatments / Results  Labs (all labs ordered are listed,  but only abnormal results are displayed) Labs Reviewed - No data to display  EKG   Radiology DG Cervical Spine Complete  Result Date: 12/20/2019 CLINICAL DATA:  Neck and left arm pain for 1 week. EXAM: CERVICAL SPINE - COMPLETE 4+ VIEW COMPARISON:  12/31/2006 FINDINGS: Mild straightening of the normal cervical lordosis appears relatively stable. Moderate and slightly progressive degenerative disc disease at C5-6 and C6-7. No acute bony findings or abnormal prevertebral soft tissue swelling. The oblique films demonstrate grossly patent bony neural foramen. The C1-2 articulations are maintained. The lung apices are grossly clear. IMPRESSION: 1. Moderate and slightly progressive degenerative disc disease at C5-6 and C6-7. 2. No acute bony findings. Electronically Signed   By: Marijo Sanes M.D.   On: 12/20/2019 12:31    Procedures Procedures (including critical care time)  Medications Ordered in UC Medications  ketorolac (TORADOL) 30 MG/ML injection 30 mg (30 mg Intramuscular Given 12/20/19 1236)    Initial Impression / Assessment and Plan / UC Course  I have reviewed the triage vital signs and the nursing notes.  Pertinent labs & imaging results that were available during my care of the patient were reviewed by me and considered in my medical decision making (see chart for details).     Cervical degenerative disc disease Most likely cause of her symptoms. X-ray without anything acute We will have her stop prednisone due to increased blood sugars and start meloxicam. Hydrocodone for worsening pain Toradol given here for pain Follow up as needed for continued or worsening symptoms  Final Clinical Impressions(s) / UC Diagnoses   Final diagnoses:  Degeneration of cervical intervertebral disc      Discharge Instructions     Your x-ray did show some degenerative disc disease in the neck.  This is most likely the cause of the nerve inflammation and your symptoms and pain. Since the prednisone is increasing your blood sugars we can change that to meloxicam daily I am also prescribing hydrocodone for worsening pain to use as needed. Follow up as needed for continued or worsening symptoms     ED Prescriptions    Medication Sig Dispense Auth. Provider   meloxicam (MOBIC) 7.5 MG tablet Take 2 tablets (15 mg total) by mouth daily. 30 tablet Nelsie Domino A, NP   HYDROcodone-acetaminophen (NORCO/VICODIN) 5-325 MG tablet Take 1-2 tablets by mouth every 6 (six) hours as needed. 12 tablet Michol Emory A, NP     I have reviewed the PDMP during this encounter.   Loura Halt A, NP 12/21/19 [phone]

## 2019-12-27 ENCOUNTER — Encounter: Payer: Self-pay | Admitting: Nurse Practitioner

## 2019-12-27 ENCOUNTER — Other Ambulatory Visit: Payer: Self-pay

## 2019-12-27 ENCOUNTER — Ambulatory Visit: Payer: Self-pay | Attending: Nurse Practitioner | Admitting: Nurse Practitioner

## 2019-12-27 DIAGNOSIS — E785 Hyperlipidemia, unspecified: Secondary | ICD-10-CM

## 2019-12-27 DIAGNOSIS — I10 Essential (primary) hypertension: Secondary | ICD-10-CM

## 2019-12-27 DIAGNOSIS — Z09 Encounter for follow-up examination after completed treatment for conditions other than malignant neoplasm: Secondary | ICD-10-CM

## 2019-12-27 DIAGNOSIS — E1165 Type 2 diabetes mellitus with hyperglycemia: Secondary | ICD-10-CM

## 2019-12-27 MED ORDER — AMLODIPINE BESYLATE 10 MG PO TABS: 10.0000 mg | ORAL_TABLET | Freq: Every day | ORAL | 2 refills | Status: DC

## 2019-12-27 MED ORDER — MELOXICAM 15 MG PO TABS: 15.0000 mg | ORAL_TABLET | Freq: Every day | ORAL | 0 refills | Status: AC

## 2019-12-27 MED ORDER — LOSARTAN POTASSIUM-HCTZ 100-25 MG PO TABS: 1.0000 | ORAL_TABLET | Freq: Every day | ORAL | 1 refills | Status: DC

## 2019-12-27 MED ORDER — INSULIN GLARGINE 100 UNIT/ML SOLOSTAR PEN: 20.0000 [IU] | PEN_INJECTOR | Freq: Every day | SUBCUTANEOUS | 11 refills | Status: DC

## 2019-12-27 MED ORDER — ATORVASTATIN CALCIUM 40 MG PO TABS: 40.0000 mg | ORAL_TABLET | Freq: Every day | ORAL | 1 refills | Status: DC

## 2019-12-27 MED FILL — LANTUS SOLOSTAR 100 UNITS/M: 100 | 30 days supply | Qty: 6 | Fill #0

## 2019-12-27 MED FILL — MELOXICAM 15 MG TABLET: 15 | 30 days supply | Qty: 30 | Fill #0

## 2019-12-27 MED FILL — ATORVASTATIN CALCIUM 40 MG: 40 | 30 days supply | Qty: 30 | Fill #0

## 2019-12-27 NOTE — Progress Notes (Signed)
Virtual Visit via Telephone Note Due to national recommendations of social distancing due to COVID 19, telehealth visit is felt to be most appropriate for this patient at this time.  I discussed the limitations, risks, security and privacy concerns of performing an evaluation and management service by telephone and the availability of in person appointments. I also discussed with the patient that there may be a patient responsible charge related to this service. The patient expressed understanding and agreed to proceed.    I connected with Diana Fleming on 12/27/19  at   8:50 AM EDT  EDT by telephone and verified that I am speaking with the correct person using two identifiers.   Consent I discussed the limitations, risks, security and privacy concerns of performing an evaluation and management service by telephone and the availability of in person appointments. I also discussed with the patient that there may be a patient responsible charge related to this service. The patient expressed understanding and agreed to proceed.   Location of Patient: Private Residence    Location of Provider: [facility]    Persons participating in Telemedicine visit: Diana Denver FNP-BC YY Diana Fleming CMA Diana Fleming    History of Present Illness: Telemedicine visit for: F/U  Cervical Radiculitis Evaluated in the ED on 4-18 and 4-21 initially with complaints of acute left arm pain and subsequently left neck pain with radiating numbnss and tingling to left arm and 4th and 5th finger. She was treated initially with prednisone and hydrocodone. She stopped taking the prednisone due to hyperglycemia and never picked up the hydrocodone. At her last visit on 4-21 she was treated with IM Toradol and started on meloxicam.  Xray negative for any acute changes.  Today she reports persistent pain and numbness however currently improving. She continues on meloxicam.   DM TYPE 2 Post  prandial 300s while she was taking prednisone last week. Most recent reading blood glucose level today 134. Endorses adherence taking invokana 300 mg daily, lantus 20 units daily and metformin 1000 mg BID. On STATIN and ACE.  Lab Results  Component Value Date   HGBA1C 11.3 (A) 07/17/2019   Dyslipidemia LDL nearing goal of <70. Taking atorvastatin 40 mg daily. Denies any statin intolerance or myalgias.  Lab Results  Component Value Date   LDLCALC 83 03/20/2019   Essential Hypertension Well controlled. Taking amlodipine 10 mg and Hyzaar 100-25 mg daily. Denies chest pain, shortness of breath, palpitations, lightheadedness, dizziness, headaches or BLE edema.  BP Readings from Last 3 Encounters:  12/20/19 139/87  12/17/19 139/85  07/17/19 (!) 144/88    Past Medical History:  Diagnosis Date  . Diabetes mellitus, type 2 ([facility])   . Dizziness 05/11/2019  . Dyslipidemia   . Essential hypertension 05/11/2019  . 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 Hypertension   . Hypothyroidism   . Pure hypercholesterolemia 05/11/2019    Past Surgical History:  Procedure Laterality Date  . DILATION AND CURETTAGE, DIAGNOSTIC / THERAPEUTIC  2002  . Radioiodine ablation    . TUBAL LIGATION      Family History  Problem Relation Age of Onset  . Cancer Mother   . Thyroid disease Mother   . Heart attack Maternal Grandfather   . Heart attack Maternal Grandmother     Social History   Socioeconomic History  . Marital status: Married    Spouse name: Not on file  . Number of children: Not on file  .  Years of education: Not on file  . Highest education level: Not on file  Occupational History  . Not on file  Tobacco Use  . Smoking status: Never Smoker  . Smokeless tobacco: Never Used  Substance and Sexual Activity  . Alcohol use: Never  . Drug use: Never  . Sexual activity: Yes  Other Topics Concern  . Not on file  Social History Narrative  . Not on file   Social Determinants of  Health   Financial Resource Strain:   . Difficulty of Paying Living Expenses:   Food Insecurity:   . Worried About Programme researcher, broadcasting/film/video in the Last Year:   . Barista in the Last Year:   Transportation Needs:   . Freight forwarder (Medical):   Marland Kitchen Lack of Transportation (Non-Medical):   Physical Activity:   . Days of Exercise per Week:   . Minutes of Exercise per Session:   Stress:   . Feeling of Stress :   Social Connections:   . Frequency of Communication with Friends and Family:   . Frequency of Social Gatherings with Friends and Family:   . Attends Religious Services:   . Active Member of Clubs or Organizations:   . Attends Banker Meetings:   Marland Kitchen Marital Status:      Observations/Objective: Awake, alert and oriented x 3   Review of Systems  Constitutional: Negative for fever, malaise/fatigue and weight loss.  HENT: Negative.  Negative for nosebleeds.   Eyes: Negative.  Negative for blurred vision, double vision and photophobia.  Respiratory: Negative.  Negative for cough and shortness of breath.   Cardiovascular: Negative.  Negative for chest pain, palpitations and leg swelling.  Gastrointestinal: Negative.  Negative for heartburn, nausea and vomiting.  Musculoskeletal: Positive for neck pain. Negative for myalgias.  Neurological: Positive for tingling and sensory change. Negative for dizziness, focal weakness, seizures and headaches.  Psychiatric/Behavioral: Negative.  Negative for suicidal ideas.    Assessment and Plan: Diana Fleming was seen today for medication refill.  Diagnoses and all orders for this visit:  Hospital discharge follow-up -     meloxicam (MOBIC) 15 MG tablet; Take 1 tablet (15 mg total) by mouth daily.  Type 2 diabetes mellitus with hyperglycemia, unspecified whether long term insulin use ([facility]) -     insulin glargine (LANTUS) 100 UNIT/ML Solostar Pen; Inject 20 Units into the skin daily at 10 pm. Continue blood sugar control as  discussed in office today, low carbohydrate diet, and regular physical exercise as tolerated, 150 minutes per week (30 min each day, 5 days per week, or 50 min 3 days per week). Keep blood sugar logs with fasting goal of 90-130 mg/dl, post prandial (after you eat) less than 180.  For Hypoglycemia: BS <60 and Hyperglycemia BS >400; contact the clinic ASAP. Annual eye exams and foot exams are recommended.   Essential hypertension -     losartan-hydrochlorothiazide (HYZAAR) 100-25 MG tablet; Take 1 tablet by mouth daily. -     amLODipine (NORVASC) 10 MG tablet; Take 1 tablet (10 mg total) by mouth daily. Continue all antihypertensives as prescribed.  Remember to bring in your blood pressure log with you for your follow up appointment.  DASH/Mediterranean Diets are healthier choices for HTN.   Hyperlipidemia, unspecified hyperlipidemia type -     atorvastatin (LIPITOR) 40 MG tablet; Take 1 tablet (40 mg total) by mouth daily at 6 PM. INSTRUCTIONS: Work on a low fat, heart healthy diet and  participate in regular aerobic exercise program by working out at least 150 minutes per week; 5 days a week-30 minutes per day. Avoid red meat/beef/steak,  fried foods. junk foods, sodas, sugary drinks, unhealthy snacking, alcohol and smoking.  Drink at least 80 oz of water per day and monitor your carbohydrate intake daily.     Follow Up Instructions Return in about 3 months (around 03/27/2020).     I discussed the assessment and treatment plan with the patient. The patient was provided an opportunity to ask questions and all were answered. The patient agreed with the plan and demonstrated an understanding of the instructions.   The patient was advised to call back or seek an in-person evaluation if the symptoms worsen or if the condition fails to improve as anticipated.  I provided 18 minutes of non-face-to-face time during this encounter including median intraservice time, reviewing previous notes, labs,  imaging, medications and explaining diagnosis and management.  Gildardo Pounds, FNP-BC

## 2019-12-28 ENCOUNTER — Other Ambulatory Visit: Payer: Self-pay

## 2019-12-28 ENCOUNTER — Ambulatory Visit: Payer: Self-pay | Attending: Nurse Practitioner

## 2019-12-28 DIAGNOSIS — I10 Essential (primary) hypertension: Secondary | ICD-10-CM

## 2019-12-28 DIAGNOSIS — E1165 Type 2 diabetes mellitus with hyperglycemia: Secondary | ICD-10-CM

## 2019-12-29 LAB — LIPID PANEL
Chol/HDL Ratio: 3.9 ratio (ref 0.0–4.4)
Cholesterol, Total: 180 mg/dL (ref 100–199)
HDL: 46 mg/dL (ref >39)
LDL Chol Calc (NIH): 105 mg/dL — ABNORMAL HIGH (ref 0–99)
Triglycerides: 168 mg/dL — ABNORMAL HIGH (ref 0–149)
VLDL Cholesterol Cal: 29 mg/dL (ref 5–40)

## 2019-12-29 LAB — CMP14+EGFR
ALT: 26 IU/L (ref 0–32)
AST: 20 IU/L (ref 0–40)
Albumin/Globulin Ratio: 1.7 (ref 1.2–2.2)
Albumin: 4.3 g/dL (ref 3.8–4.9)
Alkaline Phosphatase: 84 IU/L (ref 39–117)
BUN/Creatinine Ratio: 21 (ref 9–23)
BUN: 14 mg/dL (ref 6–24)
Bilirubin Total: 0.4 mg/dL (ref 0.0–1.2)
CO2: 21 mmol/L (ref 20–29)
Calcium: 9.3 mg/dL (ref 8.7–10.2)
Chloride: 103 mmol/L (ref 96–106)
Creatinine, Ser: 0.67 mg/dL (ref 0.57–1.00)
GFR calc Af Amer: 118 mL/min/{1.73_m2} (ref >59)
GFR calc non Af Amer: 102 mL/min/{1.73_m2} (ref >59)
Globulin, Total: 2.6 g/dL (ref 1.5–4.5)
Glucose: 189 mg/dL — ABNORMAL HIGH (ref 65–99)
Potassium: 4.1 mmol/L (ref 3.5–5.2)
Sodium: 140 mmol/L (ref 134–144)
Total Protein: 6.9 g/dL (ref 6.0–8.5)

## 2019-12-29 LAB — CBC
Hematocrit: 42.8 % (ref 34.0–46.6)
Hemoglobin: 14.7 g/dL (ref 11.1–15.9)
MCH: 31.1 pg (ref 26.6–33.0)
MCHC: 34.3 g/dL (ref 31.5–35.7)
MCV: 91 fL (ref 79–97)
Platelets: 202 10*3/uL (ref 150–450)
RBC: 4.73 x10E6/uL (ref 3.77–5.28)
RDW: 14.2 % (ref 11.7–15.4)
WBC: 4.6 10*3/uL (ref 3.4–10.8)

## 2019-12-29 LAB — HEMOGLOBIN A1C

## 2019-12-31 ENCOUNTER — Other Ambulatory Visit: Payer: Self-pay | Admitting: Nurse Practitioner

## 2019-12-31 DIAGNOSIS — E1165 Type 2 diabetes mellitus with hyperglycemia: Secondary | ICD-10-CM

## 2020-01-12 ENCOUNTER — Other Ambulatory Visit: Payer: Self-pay | Admitting: Family Medicine

## 2020-01-12 DIAGNOSIS — E039 Hypothyroidism, unspecified: Secondary | ICD-10-CM

## 2020-01-31 ENCOUNTER — Encounter: Payer: Self-pay | Admitting: Nurse Practitioner

## 2020-01-31 ENCOUNTER — Other Ambulatory Visit: Payer: Self-pay

## 2020-01-31 ENCOUNTER — Ambulatory Visit: Payer: 59 | Attending: Nurse Practitioner | Admitting: Nurse Practitioner

## 2020-01-31 ENCOUNTER — Other Ambulatory Visit: Payer: Self-pay | Admitting: Nurse Practitioner

## 2020-01-31 VITALS — BP 153/88 | HR 73 | Temp 97.7°F | Ht 73.0 in | Wt 198.0 lb

## 2020-01-31 DIAGNOSIS — E039 Hypothyroidism, unspecified: Secondary | ICD-10-CM | POA: Diagnosis not present

## 2020-01-31 DIAGNOSIS — E1165 Type 2 diabetes mellitus with hyperglycemia: Secondary | ICD-10-CM

## 2020-01-31 DIAGNOSIS — E785 Hyperlipidemia, unspecified: Secondary | ICD-10-CM

## 2020-01-31 LAB — GLUCOSE, POCT (MANUAL RESULT ENTRY): POC Glucose: 250 mg/dl — AB (ref 70–99)

## 2020-01-31 LAB — POCT GLYCOSYLATED HEMOGLOBIN (HGB A1C): Hemoglobin A1C: 8.5 % — AB (ref 4.0–5.6)

## 2020-01-31 MED ORDER — INSULIN GLARGINE 100 UNIT/ML SOLOSTAR PEN: 20.0000 [IU] | PEN_INJECTOR | Freq: Every day | SUBCUTANEOUS | 11 refills | Status: DC

## 2020-01-31 MED ORDER — CANAGLIFLOZIN 300 MG PO TABS: 300.0000 mg | ORAL_TABLET | Freq: Every day | ORAL | 3 refills | Status: DC

## 2020-01-31 MED ORDER — ONETOUCH VERIO VI STRP: ORAL_STRIP | 11 refills | Status: DC

## 2020-01-31 MED ORDER — ATORVASTATIN CALCIUM 40 MG PO TABS: 40.0000 mg | ORAL_TABLET | Freq: Every day | ORAL | 1 refills | Status: DC

## 2020-01-31 MED FILL — ONE TOUCH VERIO TEST STRIP: 30 days supply | Qty: 100 | Fill #0

## 2020-01-31 MED FILL — INVOKANA 300 MG TABLET: 300 | 30 days supply | Qty: 30 | Fill #0

## 2020-01-31 MED FILL — ATORVASTATIN CALCIUM 40 MG: 40 | 90 days supply | Qty: 90 | Fill #0

## 2020-01-31 NOTE — Progress Notes (Signed)
Assessment & Plan:  [name] was seen today for blood pressure check.  Diagnoses and all orders for this visit:  Type 2 diabetes mellitus with hyperglycemia, without long-term current use of insulin (HCC) -     Glucose (CBG) -     HgB A1c -     canagliflozin (INVOKANA) 300 MG TABS tablet; Take 1 tablet (300 mg total) by mouth daily before breakfast. -     insulin glargine (LANTUS) 100 UNIT/ML Solostar Pen; Inject 20 Units into the skin daily at 10 pm. -     glucose blood (ONETOUCH VERIO) test strip; Use as instructed to check blood sugar up to 3 times daily. E11.65 Denies chest pain, shortness of breath, palpitations, lightheadedness, dizziness, headaches or BLE edema.   Hypothyroidism, unspecified type -     TSH  Hyperlipidemia, unspecified hyperlipidemia type -     atorvastatin (LIPITOR) 40 MG tablet; Take 1 tablet (40 mg total) by mouth daily at 6 PM. INSTRUCTIONS: Work on a low fat, heart healthy diet and participate in regular aerobic exercise program by working out at least 150 minutes per week; 5 days a week-30 minutes per day. Avoid red meat/beef/steak,  fried foods. junk foods, sodas, sugary drinks, unhealthy snacking, alcohol and smoking.  Drink at least 80 oz of water per day and monitor your carbohydrate intake daily.    Patient has been counseled on age-appropriate routine health concerns for screening and prevention. These are reviewed and up-to-date. Referrals have been placed accordingly. Immunizations are up-to-date or declined.    Subjective:   Chief Complaint  Patient presents with   Blood Pressure Check    Pt. is here for blood pressure check.    HPI Diana Fleming 54 y.o. female presents to office today for follow up  She has poorly controlled diabetes due to lack of medication (cost), diet and exercise adherence.States her husband will not add her to his insurance and will not give her his tax statements to apply for the Max program here.  DM TYPE  2 Current prescribed medications are Invokana 300 mg daily, Lantus 20 units daily, Metformin 1000 mg twice daily.  She is also been prescribed atorvastatin 40 mg daily however LDL is not at goal. She is not taking her cholesterol medication. Overdue for eye exam.Medications tried in the past include Levemir 20 units (nausea), Victoza 1.8 mg. She was noncompliant with both. States she does not like needles. She is aware that we will likely not be able to lower her blood glucose levels without injectables or insulin. She is currently only taking Lantus 20 units at night and metformin during the day. Blood glucose readings "high": 150-500s. Mostly 200s. I have instructed her to take invokana which was ordered for her on a previous visit.  Lab Results  Component Value Date   HGBA1C 8.5 (A) 01/31/2020   Lab Results  Component Value Date   LDLCALC 105 (H) 12/28/2019    Essential Hypertension She did not take any of her blood pressure medications today. Does not monitor her blood pressure at home. Current medications include amlodipine 10 mg daily and losartan-hydrochlorothiazide 100-25 mg daily. BP Readings from Last 3 Encounters:  01/31/20 (!) 153/88  12/20/19 139/87  12/17/19 139/85    Hypothyroidism Well controlled.She denies any symptoms of fatigue, weight gain, diarrhea or constipation . Lab Results  Component Value Date   TSH 3.200 03/20/2019      Review of Systems  Constitutional: Negative for fever, malaise/fatigue and weight  loss.  HENT: Negative.  Negative for nosebleeds.   Eyes: Negative.  Negative for blurred vision, double vision and photophobia.  Respiratory: Negative.  Negative for cough and shortness of breath.   Cardiovascular: Negative.  Negative for chest pain, palpitations and leg swelling.  Gastrointestinal: Negative.  Negative for heartburn, nausea and vomiting.  Musculoskeletal: Negative.  Negative for myalgias.  Neurological: Negative.  Negative for dizziness,  focal weakness, seizures and headaches.  Psychiatric/Behavioral: Negative.  Negative for suicidal ideas.    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   Outpatient Medications Prior to Visit  Medication Sig Dispense Refill   amLODipine (NORVASC) 10 MG tablet Take 1 tablet (10 mg total) by mouth daily. 30 tablet 2   Blood Glucose Monitoring Suppl (ONETOUCH VERIO REFLECT) w/Device KIT 1 kit by Does not apply route in the morning, at noon, and at bedtime. Use as instructed to check blood sugar up to 3 times daily. E11.65 1 kit 0   Insulin Pen Needle (B-D UF III MINI PEN NEEDLES) 31G X 5 MM MISC Use as instructed. Check blood glucose levels 1-2 times per day. 100 each 1   losartan-hydrochlorothiazide (HYZAAR) 100-25 MG tablet Take 1 tablet by mouth daily. 90 tablet 1   metFORMIN (GLUCOPHAGE) 1000 MG tablet Take 1 tablet (1,000 mg total) by mouth 2 (two) times daily with a meal. 180 tablet 3   OneTouch Delica Lancets 35T MISC Use as instructed to check blood sugar up to 3 times daily. E11.65 100 each 11   SYNTHROID 175 MCG tablet TAKE 1 TABLET (175 MCG TOTAL) BY MOUTH DAILY. 30 tablet 1   glucose blood (ONETOUCH VERIO) test strip Use as instructed to check blood sugar up to 3 times daily. E11.65 100 each 11   HYDROcodone-acetaminophen (NORCO/VICODIN) 5-325 MG  tablet Take 1-2 tablets by mouth every 6 (six) hours as needed. 12 tablet 0   valACYclovir (VALTREX) 500 MG tablet Take 1 tablet (500 mg total) by mouth 2 (two) times daily. 14 tablet 0   atorvastatin (LIPITOR) 40 MG tablet Take 1 tablet (40 mg total) by mouth daily at 6 PM. (Patient not taking: Reported on 01/31/2020) 90 tablet 1   canagliflozin (INVOKANA) 300 MG TABS tablet Take 1 tablet (300 mg total) by mouth daily before breakfast. (Patient not taking: Reported on 01/31/2020) 30 tablet 3   insulin glargine (LANTUS) 100 UNIT/ML Solostar Pen Inject 20 Units into the skin daily at 10 pm. 15 mL 11   No facility-administered medications prior to visit.    No Known Allergies     Objective:    BP (!) 153/88 (BP Location: Right Arm, Patient Position: Sitting, Cuff Size: Normal)    Pulse 73    Temp 97.7 F (36.5 C) (Temporal)  Ht _0  (1.854 m)    Wt 198 lb (89.8 kg)    LMP 12/05/2012    SpO2 99%    BMI 26.12 kg/m  Wt Readings from Last 3 Encounters:  01/31/20 198 lb (89.8 kg)  07/17/19 192 lb (87.1 kg)  05/11/19 199 lb (90.3 kg)    Physical Exam Vitals and nursing note reviewed.  Constitutional:      Appearance: She is well-developed.  HENT:     Head: Normocephalic and atraumatic.  Cardiovascular:     Rate and Rhythm: Normal rate and regular rhythm.     Heart sounds: Normal heart sounds. No murmur. No friction rub. No gallop.   Pulmonary:     Effort: Pulmonary effort is normal. No tachypnea or respiratory distress.     Breath sounds: Normal breath sounds. No decreased breath sounds, wheezing, rhonchi or rales.  Chest:     Chest wall: No tenderness.  Abdominal:     General: Bowel sounds are normal.     Palpations: Abdomen is soft.  Musculoskeletal:        General: Normal range of motion.     Cervical back: Normal range of motion.  Skin:    General: Skin is warm and dry.  Neurological:     Mental Status: She is alert and oriented to person, place, and time.      Coordination: Coordination normal.  Psychiatric:        Behavior: Behavior normal. Behavior is cooperative.        Thought Content: Thought content normal.        Judgment: Judgment normal.          Patient has been counseled extensively about nutrition and exercise as well as the importance of adherence with medications and regular follow-up. The patient was given clear instructions to go to ER or return to medical center if symptoms don't improve, worsen or new problems develop. The patient verbalized understanding.   Follow-up: Return in about 3 months (around 05/02/2020).   Gildardo Pounds, FNP-BC [facility] [place], [place]   01/31/2020, 2:14 PM

## 2020-02-01 LAB — TSH: TSH: 1.67 u[IU]/mL (ref 0.450–4.500)

## 2020-02-20 MED FILL — SYNTHROID 175 MCG TABLET: 175 | 30 days supply | Qty: 30 | Fill #1

## 2020-03-13 MED FILL — INVOKANA 300 MG TABLET: 300 | 30 days supply | Qty: 30 | Fill #1

## 2020-03-13 MED FILL — AMLODIPINE BESYLATE 10 MG T: 10 | 30 days supply | Qty: 30 | Fill #1

## 2020-03-25 MED FILL — LOSARTAN-HCTZ 100-25 MG TAB: 100-25 | 30 days supply | Qty: 30 | Fill #1

## 2020-03-29 ENCOUNTER — Other Ambulatory Visit: Payer: Self-pay | Admitting: Family Medicine

## 2020-03-29 DIAGNOSIS — E039 Hypothyroidism, unspecified: Secondary | ICD-10-CM

## 2020-03-29 MED FILL — LANTUS SOLOSTAR 100 UNITS/M: 100 | 30 days supply | Qty: 6 | Fill #3

## 2020-03-29 MED FILL — SYNTHROID 175 MCG TABLET: 175 | 30 days supply | Qty: 30 | Fill #0

## 2020-04-03 MED FILL — BD PEN NDL MINI 31GX5MM: 31G X 5 MM | 50 days supply | Qty: 100 | Fill #0

## 2020-04-25 MED FILL — metFORMIN HCL 1000 MG TABS: 1000 | 30 days supply | Qty: 60 | Fill #3

## 2020-04-25 MED FILL — AMLODIPINE BESYLATE 10 MG T: 10 | 30 days supply | Qty: 30 | Fill #2

## 2020-04-25 MED FILL — LOSARTAN-HCTZ 100-25 MG TAB: 100-25 | 30 days supply | Qty: 30 | Fill #2

## 2020-04-25 MED FILL — SYNTHROID 175 MCG TABLET: 175 | 30 days supply | Qty: 30 | Fill #1

## 2020-04-25 MED FILL — INVOKANA 300 MG TABLET: 300 | 30 days supply | Qty: 30 | Fill #2

## 2020-05-03 ENCOUNTER — Ambulatory Visit: Payer: 59 | Attending: Nurse Practitioner | Admitting: Nurse Practitioner

## 2020-05-27 ENCOUNTER — Encounter: Payer: Self-pay | Admitting: Cardiovascular Disease

## 2020-05-27 ENCOUNTER — Ambulatory Visit (INDEPENDENT_AMBULATORY_CARE_PROVIDER_SITE_OTHER): Payer: 59 | Admitting: Cardiovascular Disease

## 2020-05-27 ENCOUNTER — Other Ambulatory Visit: Payer: Self-pay | Admitting: Cardiovascular Disease

## 2020-05-27 ENCOUNTER — Other Ambulatory Visit: Payer: Self-pay

## 2020-05-27 VITALS — BP 122/82 | HR 76 | Ht 72.0 in | Wt 197.0 lb

## 2020-05-27 DIAGNOSIS — E785 Hyperlipidemia, unspecified: Secondary | ICD-10-CM | POA: Diagnosis not present

## 2020-05-27 DIAGNOSIS — E7800 Pure hypercholesterolemia, unspecified: Secondary | ICD-10-CM | POA: Diagnosis not present

## 2020-05-27 DIAGNOSIS — I10 Essential (primary) hypertension: Secondary | ICD-10-CM

## 2020-05-27 MED ORDER — LOSARTAN POTASSIUM-HCTZ 100-25 MG PO TABS: 1.0000 | ORAL_TABLET | Freq: Every day | ORAL | 3 refills | Status: DC

## 2020-05-27 MED ORDER — AMLODIPINE BESYLATE 10 MG PO TABS: 10.0000 mg | ORAL_TABLET | Freq: Every day | ORAL | 3 refills | Status: DC

## 2020-05-27 MED ORDER — ATORVASTATIN CALCIUM 80 MG PO TABS: 80.0000 mg | ORAL_TABLET | Freq: Every day | ORAL | 3 refills | Status: DC

## 2020-05-27 MED FILL — ATORVASTATIN CALCIUM 80 MG: 80 | 90 days supply | Qty: 90 | Fill #0

## 2020-05-27 MED FILL — LOSARTAN-HCTZ 100-25 MG TAB: 100-25 | 90 days supply | Qty: 90 | Fill #0

## 2020-05-27 MED FILL — AMLODIPINE BESYLATE 10 MG T: 10 | 90 days supply | Qty: 90 | Fill #0

## 2020-05-27 NOTE — Patient Instructions (Signed)
Medication Instructions:  INCREASE YOUR ATORVASTATIN TO 80 MG DAILY   *If you need a refill on your cardiac medications before your next appointment, please call your pharmacy*  Lab Work: FASTING LP/CMET IN 3 MONTHS   If you have labs (blood work) drawn today and your tests are completely normal, you will receive your results only by: Marland Kitchen MyChart Message (if you have MyChart) OR . A paper copy in the mail If you have any lab test that is abnormal or we need to change your treatment, we will call you to review the results.  Testing/Procedures: NONE   Follow-Up: At [facility], you and your health needs are our priority.  As part of our continuing mission to provide you with exceptional heart care, we have created designated Provider Care Teams.  These Care Teams include your primary Cardiologist (physician) and Advanced Practice Providers (APPs -  Physician Assistants and Nurse Practitioners) who all work together to provide you with the care you need, when you need it.  We recommend signing up for the patient portal called "MyChart".  Sign up information is provided on this After Visit Summary.  MyChart is used to connect with patients for Virtual Visits (Telemedicine).  Patients are able to view lab/test results, encounter notes, upcoming appointments, etc.  Non-urgent messages can be sent to your provider as well.   To learn more about what you can do with MyChart, go to [email or web address].    Your next appointment:   12 month(s)  You will receive a reminder letter in the mail two months in advance. If you don't receive a letter, please call our office to schedule the follow-up appointment.  The format for your next appointment:   In Person  Provider:   You may see DR [facility] or one of the following Advanced Practice Providers on your designated Care Team:    Corine Shelter, PA-C  [place], [place]  Edd Fabian, [place]    Other Instructions  LIMIT FRIED FOODS,  GREASY/FATTY FOODS, CHEESES, AND  GRAVY

## 2020-05-27 NOTE — Progress Notes (Signed)
Cardiology Office Note   Date:  05/27/2020   ID:  Diana Fleming [birth year], MRN [id number]  PCP:  Diana Pounds, NP  Cardiologist:   Skeet Latch, MD   No chief complaint on file.    History of Present Illness: Diana Fleming is a 54 y.o. female with hypertension, hyperlipidemia and DM  who presents for follow-up.  She was initially seen in the hospital 02/02/2018 in the setting of hypertensive urgency.  She also had exertional chest pain.  However that improved with blood pressure control.    She underwent a coronary CT-A on 02/02/2018 that revealed a calcium score of 0 and no coronary disease.  Echo at that time revealed LVEF 60 to 65% with grade 1 diastolic dysfunction.  She had aortic valve sclerosis without stenosis.  She had been off her antihypertensives at the time.  These were restarted at discharge.  She followed up with Diana Ferries, PA, on 6/20 and was doing well.    Lately she has been feeling well.  She walks her dog for 30 min to an hour every day.  She has no chest pain or shortness of breath in general.  She occasionally has shortness of breath if she runs up the stairs quickly.  She denies lower extremity edema, orthopnea or PND.  She is unaware on limiting the salt in her diet.  She does not check her blood pressure often but when she does it is typically well-controlled.  Past Medical History:  Diagnosis Date  . Diabetes mellitus, type 2 ([place])   . Dizziness 05/11/2019  . Dyslipidemia   . Essential hypertension 05/11/2019  . 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 Hypertension   . Hypothyroidism   . Pure hypercholesterolemia 05/11/2019    Past Surgical History:  Procedure Laterality Date  . DILATION AND CURETTAGE, DIAGNOSTIC / THERAPEUTIC  2002  . Radioiodine ablation    . TUBAL LIGATION       Current Outpatient Medications  Medication Sig Dispense Refill  . amLODipine (NORVASC) 10 MG tablet Take 1 tablet (10 mg total) by mouth  daily. 90 tablet 3  . atorvastatin (LIPITOR) 80 MG tablet Take 1 tablet (80 mg total) by mouth daily at 6 PM. 90 tablet 3  . Blood Glucose Monitoring Suppl (ONETOUCH VERIO REFLECT) w/Device KIT 1 kit by Does not apply route in the morning, at noon, and at bedtime. Use as instructed to check blood sugar up to 3 times daily. E11.65 1 kit 0  . canagliflozin (INVOKANA) 300 MG TABS tablet Take 1 tablet (300 mg total) by mouth daily before breakfast. 30 tablet 3  . glucose blood (ONETOUCH VERIO) test strip Use as instructed to check blood sugar up to 3 times daily. E11.65 100 each 11  . Insulin Pen Needle (B-D UF III MINI PEN NEEDLES) 31G X 5 MM MISC Use as instructed. Check blood glucose levels 1-2 times per day. 100 each 1  . metFORMIN (GLUCOPHAGE) 1000 MG tablet Take 1 tablet (1,000 mg total) by mouth 2 (two) times daily with a meal. 180 tablet 3  . OneTouch Delica Lancets 58I MISC Use as instructed to check blood sugar up to 3 times daily. E11.65 100 each 11  . SYNTHROID 175 MCG tablet TAKE 1 TABLET (175 MCG TOTAL) BY MOUTH DAILY. 30 tablet 1  . insulin glargine (LANTUS) 100 UNIT/ML Solostar Pen Inject 20 Units into the skin daily at 10 pm. 15 mL 11  .  losartan-hydrochlorothiazide (HYZAAR) 100-25 MG tablet Take 1 tablet by mouth daily. 90 tablet 3   No current facility-administered medications for this visit.    Allergies:   Patient has no known allergies.    Social History:  The patient  reports that she has never smoked. She has never used smokeless tobacco. She reports that she does not drink alcohol and does not use drugs.   Family History:  The patient's family history includes Cancer in her mother; Heart attack in her maternal grandfather and maternal grandmother; Thyroid disease in her mother.    ROS:  Please see the history of present illness.   Otherwise, review of systems are positive for none.   All other systems are reviewed and negative.    PHYSICAL EXAM: VS:  BP 122/82   Pulse  76   Ht 6' (1.829 m)   Wt 197 lb (89.4 kg)   LMP 12/05/2012   SpO2 98%   BMI 26.72 kg/m  , BMI Body mass index is 26.72 kg/m. GENERAL:  Well appearing HEENT: Pupils equal round and reactive, fundi not visualized, oral mucosa unremarkable NECK:  No jugular venous distention, waveform within normal limits, carotid upstroke brisk and symmetric, no bruits LUNGS:  Clear to auscultation bilaterally HEART:  RRR.  PMI not displaced or sustained,S1 and S2 within normal limits, no S3, no S4, no clicks, no rubs, no murmurs ABD:  Flat, positive bowel sounds normal in frequency in pitch, no bruits, no rebound, no guarding, no midline pulsatile mass, no hepatomegaly, no splenomegaly EXT:  2 plus pulses throughout, no edema, no cyanosis no clubbing SKIN:  No rashes no nodules NEURO:  Cranial nerves II through XII grossly intact, motor grossly intact throughout PSYCH:  Cognitively intact, oriented to person place and time   EKG:  EKG is ordered today. The ekg ordered 05/11/2019 demonstrates sinus rhythm.  Rate 86 bpm.  Nonspecific ST changes. 05/27/2020: Sinus rhythm.  Rate 76 bpm.  Nonspecific T wave abnormalities  Coronary CT-A 02/02/18: IMPRESSION: 1. Coronary artery calcium score 0 Agatston units, suggesting low risk for future cardiac events.  2.  No plaque or stenosis noted in the coronary tree.  ECHO: 02/01/2018 - Left ventricle: The cavity size was normal. Wall thickness was increased in a pattern of mild LVH. Systolic function was normal. The estimated ejection fraction was in the range of 60% to 65%. Doppler parameters are consistent with abnormal left ventricular relaxation (grade 1 diastolic dysfunction). The E/e&' ratio is between 8-15, suggesting indeterminate LV filling pressure. - Aortic valve: Sclerosis without stenosis. There was no regurgitation. - Left atrium: The atrium was normal in size. - Tricuspid valve: There was trivial regurgitation. - Pulmonary arteries:  PA peak pressure: 17 mm Hg (S). - Inferior vena cava: The vessel was normal in size. The respirophasic diameter changes were in the normal range (>= 50%), consistent with normal central venous pressure.  Impressions:  - LVEF 60-65%, mild LVH, normal wall motion, grade 1 DD, indeterminate LV filling pressure, aortic valve sclerosis, normal LA size, trivial TR, RVSP 17 mmHg, normal IVC.   Recent Labs: 12/28/2019: ALT 26; BUN 14; Creatinine, Ser 0.67; Hemoglobin 14.7; Platelets 202; Potassium 4.1; Sodium 140 01/31/2020: TSH 1.670    Lipid Panel    Component Value Date/Time   CHOL 180 12/28/2019 0857   TRIG 168 (H) 12/28/2019 0857   HDL 46 12/28/2019 0857   CHOLHDL 3.9 12/28/2019 0857   CHOLHDL 5.0 01/31/2018 1845   VLDL 44 (H) 01/31/2018 1845  [place] 105 (H) 12/28/2019 0857      Wt Readings from Last 3 Encounters:  05/27/20 197 lb (89.4 kg)  01/31/20 198 lb (89.8 kg)  07/17/19 192 lb (87.1 kg)      ASSESSMENT AND PLAN:  # Essential hypertension: Blood pressure is controlled on clozapine, losartan, and HCTZ.  # Hyperlipidemia: Lipids are above goal.  Given that she has diabetes ideally her LDL would be less than 70.  We will increase atorvastatin to 80 mg and repeat lipids and a CMP in 3 months.  She will also work on reducing fried foods, fatty foods red meat, and cheese.  # Dizziness/palpitations:  Resolved.   Current medicines are reviewed at length with the patient today.  The patient does not have concerns regarding medicines.  The following changes have been made: Increase atorvastatin  Labs/ tests ordered today include:   Orders Placed This Encounter  Procedures  . Lipid panel  . Comprehensive metabolic panel  . EKG 12-Lead     Disposition:   FU with Diana Fowle C. Oval Linsey, MD, [facility] in 1 year.      Signed, Diana Sunday C. Oval Linsey, MD, [facility]  05/27/2020 8:48 AM    [facility]

## 2020-05-29 ENCOUNTER — Other Ambulatory Visit: Payer: Self-pay | Admitting: Family Medicine

## 2020-05-29 DIAGNOSIS — E039 Hypothyroidism, unspecified: Secondary | ICD-10-CM

## 2020-05-29 MED FILL — LANTUS SOLOSTAR 100 UNITS/M: 100 | 30 days supply | Qty: 6 | Fill #4

## 2020-05-29 MED FILL — INVOKANA 300 MG TABLET: 300 | 30 days supply | Qty: 30 | Fill #3

## 2020-05-29 MED FILL — SYNTHROID 175 MCG TABLET: 175 | 90 days supply | Qty: 90 | Fill #0

## 2020-05-29 MED FILL — metFORMIN HCL 1000 MG TABS: 1000 | 30 days supply | Qty: 60 | Fill #4

## 2020-05-29 NOTE — Telephone Encounter (Signed)
Requested Prescriptions  Pending Prescriptions Disp Refills  . SYNTHROID 175 MCG tablet [Pharmacy Med Name: SYNTHROID 175 MCG TABLET 175 Tablet] 90 tablet 0    Sig: TAKE 1 TABLET (175 MCG TOTAL) BY MOUTH DAILY.     Endocrinology:  Hypothyroid Agents Failed - 05/29/2020  9:06 AM      Failed - TSH needs to be rechecked within 3 months after an abnormal result. Refill until TSH is due.      Passed - TSH in normal range and within 360 days    TSH  Date Value Ref Range Status  01/31/2020 1.670 0.450 - 4.500 uIU/mL Final         Passed - Valid encounter within last 12 months    Recent Outpatient Visits          3 months ago Type 2 diabetes mellitus with hyperglycemia, without long-term current use of insulin [facility])   [place] [facility] [place], Shea Stakes, NP   5 months ago Hospital discharge follow-up   [facility] [place], [place] W, NP   10 months ago Type 2 diabetes mellitus with hyperglycemia, unspecified whether long term insulin use [facility])   [place] [facility] [place], [place] W, NP   11 months ago Type 2 diabetes mellitus with hyperglycemia, unspecified whether long term insulin use [facility])   [place] [facility] [place], Cornelius Moras, RPH-CPP   1 year ago Type 2 diabetes mellitus with hyperglycemia, unspecified whether long term insulin use [facility])   [facility] [place], Cornelius Moras, RPH-CPP      Future Appointments            In 2 weeks Claiborne Rigg, NP [facility]

## 2020-06-18 ENCOUNTER — Other Ambulatory Visit: Payer: Self-pay

## 2020-06-18 ENCOUNTER — Ambulatory Visit: Payer: 59 | Attending: Nurse Practitioner | Admitting: Nurse Practitioner

## 2020-07-16 ENCOUNTER — Other Ambulatory Visit: Payer: Self-pay | Admitting: Nurse Practitioner

## 2020-07-16 DIAGNOSIS — E1165 Type 2 diabetes mellitus with hyperglycemia: Secondary | ICD-10-CM

## 2020-07-16 MED FILL — INVOKANA 300 MG TABLET: 300 | 30 days supply | Qty: 30 | Fill #0

## 2020-07-29 ENCOUNTER — Other Ambulatory Visit: Payer: Self-pay | Admitting: Nurse Practitioner

## 2020-07-29 DIAGNOSIS — E1165 Type 2 diabetes mellitus with hyperglycemia: Secondary | ICD-10-CM

## 2020-07-29 MED FILL — LANTUS SOLOSTAR 100 UNITS/M: 100 | 30 days supply | Qty: 6 | Fill #0

## 2020-07-30 MED FILL — BD PEN NDL MINI 31GX5MM: 31G X 5 MM | 50 days supply | Qty: 100 | Fill #0

## 2020-08-27 ENCOUNTER — Other Ambulatory Visit: Payer: Self-pay | Admitting: Nurse Practitioner

## 2020-08-27 DIAGNOSIS — E1165 Type 2 diabetes mellitus with hyperglycemia: Secondary | ICD-10-CM

## 2020-08-27 MED FILL — AMLODIPINE BESYLATE 10 MG T: 10 | 90 days supply | Qty: 90 | Fill #1

## 2020-08-27 MED FILL — LOSARTAN-HCTZ 100-25 MG TAB: 100-25 | 90 days supply | Qty: 90 | Fill #1

## 2020-08-27 MED FILL — ATORVASTATIN CALCIUM 80 MG: 80 | 90 days supply | Qty: 90 | Fill #1

## 2020-08-28 ENCOUNTER — Other Ambulatory Visit: Payer: Self-pay | Admitting: Family Medicine

## 2020-08-28 DIAGNOSIS — E039 Hypothyroidism, unspecified: Secondary | ICD-10-CM

## 2020-08-28 MED FILL — SYNTHROID 175 MCG TABLET: 175 | 90 days supply | Qty: 90 | Fill #0

## 2020-08-30 MED FILL — LANTUS SOLOSTAR 100 UNITS/M: 100 | 30 days supply | Qty: 6 | Fill #1

## 2020-09-16 ENCOUNTER — Other Ambulatory Visit: Payer: Self-pay | Admitting: Nurse Practitioner

## 2020-09-16 NOTE — Telephone Encounter (Signed)
Requested medication (s) are due for refill today: expired medication  Requested medication (s) are on the active medication list: yes   Last refill:  07/17/2019 #180 3 refill   Future visit scheduled: yes in 2 days   Notes to clinic:  expired medication,      Requested Prescriptions  Pending Prescriptions Disp Refills   metFORMIN (GLUCOPHAGE) 1000 MG tablet [Pharmacy Med Name: metFORMIN HCL 1000 MG TABS 1000 Tablet] 60 tablet 3    Sig: Take 1 tablet (1,000 mg total) by mouth 2 (two) times daily with a meal.      Endocrinology:  Diabetes - Biguanides Failed - 09/16/2020  2:51 PM      Failed - HBA1C is between 0 and 7.9 and within 180 days    Hemoglobin A1C  Date Value Ref Range Status  01/31/2020 8.5 (A) 4.0 - 5.6 % Final   Hgb A1c MFr Bld  Date Value Ref Range Status  12/28/2019 CANCELED %     Comment:    [facility] was unable to collect sufficient specimen to perform the following test(s), and is providing the patient with re-collection instructions.          Prediabetes: 5.7 - 6.4          Diabetes: >6.4          Glycemic control for adults with diabetes: <7.0  Result canceled by the ancillary.           Failed - Valid encounter within last 6 months    Recent Outpatient Visits           7 months ago Type 2 diabetes mellitus with hyperglycemia, without long-term current use of insulin [facility])   [place], Vernia Buff, NP   8 months ago Hospital discharge follow-up   [place], [place] W, NP   1 year ago Type 2 diabetes mellitus with hyperglycemia, unspecified whether long term insulin use [facility])   [place], [place] W, NP   1 year ago Type 2 diabetes mellitus with hyperglycemia, unspecified whether long term insulin use [facility])   [place], Annie [place] L, RPH-CPP   1 year ago Type 2 diabetes mellitus with hyperglycemia,  unspecified whether long term insulin use [facility])   [place], RPH-CPP       Future Appointments             In 2 days [place] Donovan, PA-C [place] in normal range and within 360 days    Creatinine, Ser  Date Value Ref Range Status  12/28/2019 0.67 0.57 - 1.00 mg/dL Final          Passed - AA eGFR in normal range and within 360 days    GFR calc Af Amer  Date Value Ref Range Status  12/28/2019 118 >59 mL/min/1.73 Final    Comment:    **[facility] currently reports eGFR in compliance with the current**   recommendations of the [employer]. [facility] will   update reporting as new guidelines are published from the NKF-ASN   Task force.    GFR calc non Af Amer  Date Value Ref Range Status  12/28/2019 102 >59 mL/min/1.73 Final

## 2020-09-18 ENCOUNTER — Other Ambulatory Visit: Payer: Self-pay

## 2020-09-18 ENCOUNTER — Ambulatory Visit: Payer: 59 | Attending: Physician Assistant | Admitting: Physician Assistant

## 2020-09-18 ENCOUNTER — Other Ambulatory Visit: Payer: Self-pay | Admitting: Physician Assistant

## 2020-09-18 ENCOUNTER — Encounter: Payer: Self-pay | Admitting: Physician Assistant

## 2020-09-18 VITALS — BP 144/85 | HR 81 | Temp 98.7°F | Resp 16 | Wt 194.0 lb

## 2020-09-18 DIAGNOSIS — E1165 Type 2 diabetes mellitus with hyperglycemia: Secondary | ICD-10-CM

## 2020-09-18 DIAGNOSIS — R8290 Unspecified abnormal findings in urine: Secondary | ICD-10-CM | POA: Diagnosis not present

## 2020-09-18 DIAGNOSIS — E785 Hyperlipidemia, unspecified: Secondary | ICD-10-CM | POA: Diagnosis not present

## 2020-09-18 DIAGNOSIS — I10 Essential (primary) hypertension: Secondary | ICD-10-CM

## 2020-09-18 DIAGNOSIS — E039 Hypothyroidism, unspecified: Secondary | ICD-10-CM

## 2020-09-18 LAB — POCT URINALYSIS DIP (CLINITEK)
Bilirubin, UA: NEGATIVE
Blood, UA: NEGATIVE
Glucose, UA: 500 mg/dL — AB
Ketones, POC UA: NEGATIVE mg/dL
Leukocytes, UA: NEGATIVE
Nitrite, UA: NEGATIVE
POC PROTEIN,UA: NEGATIVE
Spec Grav, UA: 1.025 (ref 1.010–1.025)
Urobilinogen, UA: 1 E.U./dL
pH, UA: 7 (ref 5.0–8.0)

## 2020-09-18 LAB — GLUCOSE, POCT (MANUAL RESULT ENTRY): POC Glucose: 263 mg/dl — AB (ref 70–99)

## 2020-09-18 LAB — POCT GLYCOSYLATED HEMOGLOBIN (HGB A1C): HbA1c, POC (controlled diabetic range): 9.2 % — AB (ref 0.0–7.0)

## 2020-09-18 MED ORDER — ONETOUCH VERIO VI STRP: ORAL_STRIP | 11 refills | Status: DC

## 2020-09-18 MED ORDER — AMLODIPINE BESYLATE 10 MG PO TABS: 10.0000 mg | ORAL_TABLET | Freq: Every day | ORAL | 3 refills | Status: DC

## 2020-09-18 MED ORDER — LOSARTAN POTASSIUM-HCTZ 100-25 MG PO TABS: 1.0000 | ORAL_TABLET | Freq: Every day | ORAL | 3 refills | Status: DC

## 2020-09-18 MED ORDER — BD PEN NEEDLE MINI U/F 31G X 5 MM MISC: 1 refills | Status: DC

## 2020-09-18 MED ORDER — SYNTHROID 175 MCG PO TABS: 175.0000 ug | ORAL_TABLET | Freq: Every day | ORAL | 1 refills | Status: DC

## 2020-09-18 MED ORDER — METFORMIN HCL 1000 MG PO TABS: 1000.0000 mg | ORAL_TABLET | Freq: Two times a day (BID) | ORAL | 3 refills | Status: DC

## 2020-09-18 MED ORDER — INSULIN GLARGINE 100 UNIT/ML SOLOSTAR PEN: 25.0000 [IU] | PEN_INJECTOR | Freq: Every day | SUBCUTANEOUS | 11 refills | Status: DC

## 2020-09-18 MED ORDER — CANAGLIFLOZIN 300 MG PO TABS: ORAL_TABLET | ORAL | 3 refills | Status: DC

## 2020-09-18 MED ORDER — ATORVASTATIN CALCIUM 80 MG PO TABS: 80.0000 mg | ORAL_TABLET | Freq: Every day | ORAL | 3 refills | Status: DC

## 2020-09-18 MED FILL — UNIFINE PENTIPS 31GX3/16: 31G X 5 MM | 50 days supply | Qty: 100 | Fill #0

## 2020-09-18 MED FILL — METFORMIN HCL 1000 MG TABS: 1000 | 90 days supply | Qty: 180 | Fill #0

## 2020-09-18 MED FILL — LANTUS SOLOSTAR 100 UNITS/M: 100 | 24 days supply | Qty: 6 | Fill #0

## 2020-09-18 NOTE — Patient Instructions (Signed)
Check blood sugars fasting and bedtime daily and record.  Drink 80-100 ounces daily

## 2020-09-18 NOTE — Progress Notes (Signed)
Diana Fleming, is a 55 y.o. female  HWT:[id number]  [id number]:[id number]  DOB - 07-Apr-1966  Subjective:  Chief Complaint and HPI: Diana Fleming is a 55 y.o. female here today needing meds filled and urine "smells funny."  She is compliant with meds but ran out of everything 2 to 7 days ago.  No dysuria.  No vaginal discharge  Not compliant with diabetic diet or checking blood sugars.  Denies hypoglycemic episodes.  She does have a meter.    ROS:   Constitutional:  No f/c, No night sweats, No unexplained weight loss. EENT:  No vision changes, No blurry vision, No hearing changes. No mouth, throat, or ear problems.  Respiratory: No cough, No SOB Cardiac: No CP, no palpitations GI:  No abd pain, No N/V/D. GU: see above Musculoskeletal: No joint pain Neuro: No headache, no dizziness, no motor weakness.  Skin: No rash Endocrine:  No polydipsia. No polyuria.  Psych: Denies SI/HI  Problem  Acquired Hypothyroidism    ALLERGIES: No Known Allergies  PAST MEDICAL HISTORY: Past Medical History:  Diagnosis Date  . Diabetes mellitus, type 2 ([place])   . Dizziness 05/11/2019  . Dyslipidemia   . Essential hypertension 05/11/2019  . 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 Hypertension   . Hypothyroidism   . Pure hypercholesterolemia 05/11/2019    MEDICATIONS AT HOME: Prior to Admission medications   Medication Sig Start Date End Date Taking? Authorizing Provider  amLODipine (NORVASC) 10 MG tablet Take 1 tablet (10 mg total) by mouth daily. 09/18/20   Argentina Donovan, PA-C  atorvastatin (LIPITOR) 80 MG tablet Take 1 tablet (80 mg total) by mouth daily at 6 PM. 09/18/20   Lindamarie Maclachlan, Dionne Bucy, PA-C  Blood Glucose Monitoring Suppl (ONETOUCH VERIO REFLECT) w/Device KIT 1 kit by Does not apply route in the morning, at noon, and at bedtime. Use as instructed to check blood sugar up to 3 times daily. E11.65 11/24/19   Charlott Rakes, MD  canagliflozin (INVOKANA) 300 MG TABS tablet TAKE 1  TABLET (300 MG TOTAL) BY MOUTH DAILY BEFORE BREAKFAST. 09/18/20   Argentina Donovan, PA-C  glucose blood (ONETOUCH VERIO) test strip Use as instructed to check blood sugar up to 3 times daily. E11.65 09/18/20   Argentina Donovan, PA-C  insulin glargine (LANTUS) 100 UNIT/ML Solostar Pen Inject 25 Units into the skin daily at 10 pm. 09/18/20 10/18/20  Argentina Donovan, PA-C  Insulin Pen Needle (B-D UF III MINI PEN NEEDLES) 31G X 5 MM MISC As directed 09/18/20   Argentina Donovan, PA-C  losartan-hydrochlorothiazide (HYZAAR) 100-25 MG tablet Take 1 tablet by mouth daily. 09/18/20 12/17/20  Argentina Donovan, PA-C  metFORMIN (GLUCOPHAGE) 1000 MG tablet Take 1 tablet (1,000 mg total) by mouth 2 (two) times daily with a meal. 09/18/20   Tashiba Timoney, Dionne Bucy, PA-C  OneTouch Delica Lancets 48A MISC Use as instructed to check blood sugar up to 3 times daily. E11.65 11/24/19   Charlott Rakes, MD  SYNTHROID 175 MCG tablet Take 1 tablet (175 mcg total) by mouth daily. 09/18/20   Argentina Donovan, PA-C     Objective:  EXAM:   Vitals:   09/18/20 1003  BP: (!) 144/85  Pulse: 81  Resp: 16  Temp: 98.7 F (37.1 C)  SpO2: 98%  Weight: 194 lb (88 kg)    General appearance : A&OX3. NAD. Non-toxic-appearing HEENT: Atraumatic and Normocephalic.  PERRLA. EOM intact.  Neck: supple, no JVD. No cervical lymphadenopathy.  No thyromegaly Chest/Lungs:  Breathing-non-labored, Good air entry bilaterally, breath sounds normal without rales, rhonchi, or wheezing  CVS: S1 S2 regular, no murmurs, gallops, rubs Extremities: Bilateral Lower Ext shows no edema, both legs are warm to touch with = pulse throughout Neurology:  CN II-XII grossly intact, Non focal.   Psych:  TP linear. J/I WNL. Normal speech. Appropriate eye contact and affect.  Skin:  No Rash  Data Review Lab Results  Component Value Date   HGBA1C 9.2 (A) 09/18/2020   HGBA1C 8.5 (A) 01/31/2020   HGBA1C CANCELED 12/28/2019     Assessment & Plan   1. Type 2  diabetes mellitus with hyperglycemia, without long-term current use of insulin (HCC) Uncontrolled.  Check blood sugars at least bid and record.  Increase lantus from 20 to 25 - Glucose (CBG) - HgB A1c - insulin glargine (LANTUS) 100 UNIT/ML Solostar Pen; Inject 25 Units into the skin daily at 10 pm.  Dispense: 15 mL; Refill: 11 - canagliflozin (INVOKANA) 300 MG TABS tablet; TAKE 1 TABLET (300 MG TOTAL) BY MOUTH DAILY BEFORE BREAKFAST.  Dispense: 30 tablet; Refill: 3 - Insulin Pen Needle (B-D UF III MINI PEN NEEDLES) 31G X 5 MM MISC; As directed  Dispense: 100 each; Refill: 1 - glucose blood (ONETOUCH VERIO) test strip; Use as instructed to check blood sugar up to 3 times daily. E11.65  Dispense: 100 each; Refill: 11 - Comprehensive metabolic panel - CBC with Differential/Platelet  2. Hypothyroidism, unspecified type - Thyroid Panel With TSH - Comprehensive metabolic panel -continue [id number] synthroid  3. Hyperlipidemia, unspecified hyperlipidemia type - Lipid panel - Comprehensive metabolic panel - CBC with Differential/Platelet - atorvastatin (LIPITOR) 80 MG tablet; Take 1 tablet (80 mg total) by mouth daily at 6 PM.  Dispense: 90 tablet; Refill: 3  4. Essential hypertension Off meds for a few days-resume - losartan-hydrochlorothiazide (HYZAAR) 100-25 MG tablet; Take 1 tablet by mouth daily.  Dispense: 90 tablet; Refill: 3 - Comprehensive metabolic panel - CBC with Differential/Platelet - amLODipine (NORVASC) 10 MG tablet; Take 1 tablet (10 mg total) by mouth daily.  Dispense: 90 tablet; Refill: 3  5. Abnormal urine odor Due to glucose-increase water intake and better glycemic control inmperative - POCT URINALYSIS DIP (CLINITEK)   Patient have been counseled extensively about nutrition and exercise  Return for [facility] in 3-4 weeks for DM;  3 months with Zelda.  The patient was given clear instructions to go to ER or return to medical center if symptoms don't improve, worsen or new  problems develop. The patient verbalized understanding. The patient was told to call to get lab results if they haven't heard anything in the next week.     Freeman Caldron, PA-C [facility] [place], [place]   09/18/2020, 11:09 AMPatient ID: Mardi Mainland, female   DOB: 1966/02/09, 55 y.o.   MRN: [id number]

## 2020-09-19 ENCOUNTER — Telehealth (INDEPENDENT_AMBULATORY_CARE_PROVIDER_SITE_OTHER): Payer: Self-pay

## 2020-09-19 ENCOUNTER — Other Ambulatory Visit: Payer: Self-pay | Admitting: Physician Assistant

## 2020-09-19 DIAGNOSIS — E039 Hypothyroidism, unspecified: Secondary | ICD-10-CM

## 2020-09-19 LAB — COMPREHENSIVE METABOLIC PANEL
ALT: 41 IU/L — ABNORMAL HIGH (ref 0–32)
AST: 20 IU/L (ref 0–40)
Albumin/Globulin Ratio: 1.6 (ref 1.2–2.2)
Albumin: 4.5 g/dL (ref 3.8–4.9)
Alkaline Phosphatase: 138 IU/L — ABNORMAL HIGH (ref 44–121)
BUN/Creatinine Ratio: 11 (ref 9–23)
BUN: 8 mg/dL (ref 6–24)
Bilirubin Total: 0.5 mg/dL (ref 0.0–1.2)
CO2: 26 mmol/L (ref 20–29)
Calcium: 9.9 mg/dL (ref 8.7–10.2)
Chloride: 98 mmol/L (ref 96–106)
Creatinine, Ser: 0.73 mg/dL (ref 0.57–1.00)
GFR calc Af Amer: 110 mL/min/{1.73_m2} (ref >59)
GFR calc non Af Amer: 95 mL/min/{1.73_m2} (ref >59)
Globulin, Total: 2.9 g/dL (ref 1.5–4.5)
Glucose: 247 mg/dL — ABNORMAL HIGH (ref 65–99)
Potassium: 3.5 mmol/L (ref 3.5–5.2)
Sodium: 138 mmol/L (ref 134–144)
Total Protein: 7.4 g/dL (ref 6.0–8.5)

## 2020-09-19 LAB — CBC WITH DIFFERENTIAL/PLATELET
Basophils Absolute: 0 10*3/uL (ref 0.0–0.2)
Basos: 1 %
EOS (ABSOLUTE): 0 10*3/uL (ref 0.0–0.4)
Eos: 1 %
Hematocrit: 46.1 % (ref 34.0–46.6)
Hemoglobin: 15.7 g/dL (ref 11.1–15.9)
Immature Grans (Abs): 0 10*3/uL (ref 0.0–0.1)
Immature Granulocytes: 0 %
Lymphocytes Absolute: 2 10*3/uL (ref 0.7–3.1)
Lymphs: 38 %
MCH: 29.1 pg (ref 26.6–33.0)
MCHC: 34.1 g/dL (ref 31.5–35.7)
MCV: 86 fL (ref 79–97)
Monocytes Absolute: 0.5 10*3/uL (ref 0.1–0.9)
Monocytes: 9 %
Neutrophils Absolute: 2.7 10*3/uL (ref 1.4–7.0)
Neutrophils: 51 %
Platelets: 222 10*3/uL (ref 150–450)
RBC: 5.39 x10E6/uL — ABNORMAL HIGH (ref 3.77–5.28)
RDW: 12.2 % (ref 11.7–15.4)
WBC: 5.2 10*3/uL (ref 3.4–10.8)

## 2020-09-19 LAB — LIPID PANEL
Chol/HDL Ratio: 2.9 ratio (ref 0.0–4.4)
Cholesterol, Total: 120 mg/dL (ref 100–199)
HDL: 42 mg/dL (ref >39)
LDL Chol Calc (NIH): 59 mg/dL (ref 0–99)
Triglycerides: 103 mg/dL (ref 0–149)
VLDL Cholesterol Cal: 19 mg/dL (ref 5–40)

## 2020-09-19 LAB — THYROID PANEL WITH TSH
Free Thyroxine Index: 3.4 (ref 1.2–4.9)
T3 Uptake Ratio: 26 % (ref 24–39)
T4, Total: 12.9 ug/dL — ABNORMAL HIGH (ref 4.5–12.0)
TSH: 0.045 u[IU]/mL — ABNORMAL LOW (ref 0.450–4.500)

## 2020-09-19 MED ORDER — LEVOTHYROXINE SODIUM 150 MCG PO TABS: 150.0000 ug | ORAL_TABLET | Freq: Every day | ORAL | 3 refills | Status: DC

## 2020-09-19 NOTE — Telephone Encounter (Signed)
Patient verified date of birth. She is aware that her thyroid medication dose is currently too high. Discontinue current dose of 175 mcg for now. New prescription of 150 mcg has been sent to the pharmacy. Cholesterol is great. Other labs normal. Advised patient to work on decreasing sugar/white carb intake and take all medications as prescribed. She verbalized understanding of all results and did not have any questions. Maryjean Morn, CMA

## 2020-09-19 NOTE — Telephone Encounter (Signed)
-----   Message from Anders Simmonds, [place] sent at 09/19/2020  8:57 AM EST ----- Your thyroid dose is currently too high.  I sent you a new prescription for synthroid to take once daily.  Discontinue the for now.  Cholesterol is great.  Kidney function, liver function, and electrolytes are normal.  Work on decreasing sugar/white carb intake and take all meds as prescribed.  Follow up as planned.  Thanks, Georgian Co, PA-C

## 2020-09-24 ENCOUNTER — Other Ambulatory Visit: Payer: Self-pay | Admitting: Nurse Practitioner

## 2020-09-24 ENCOUNTER — Telehealth: Payer: Self-pay

## 2020-09-24 MED ORDER — DAPAGLIFLOZIN PROPANEDIOL 10 MG PO TABS: 10.0000 mg | ORAL_TABLET | Freq: Every day | ORAL | 3 refills | Status: DC

## 2020-09-24 MED FILL — SYNTHROID 150 MCG TABLET: 150 | 90 days supply | Qty: 90 | Fill #0

## 2020-09-24 NOTE — Telephone Encounter (Signed)
Thanks Diana Fleming! I sent farxiga

## 2020-09-24 NOTE — Telephone Encounter (Signed)
Invokana is non-preferred under patients ins.  If appropriate, can you change therapy to the preferred option or Jardiance or [place]

## 2020-09-25 ENCOUNTER — Other Ambulatory Visit: Payer: Self-pay | Admitting: Physician Assistant

## 2020-09-25 MED ORDER — FREESTYLE LITE TEST VI STRP
ORAL_STRIP | 12 refills | Status: DC
Start: 2020-09-25 — End: 2020-09-25

## 2020-09-25 MED ORDER — FREESTYLE LITE W/DEVICE KIT: 1.0000 | PACK | Freq: Two times a day (BID) | 0 refills | Status: DC

## 2020-09-25 MED ORDER — FREESTYLE LANCETS MISC
12 refills | Status: DC
Start: 2020-09-25 — End: 2020-12-17

## 2020-09-25 MED FILL — TRUEplus LANCETS 28G MISC: 25 days supply | Qty: 100 | Fill #0

## 2020-09-25 MED FILL — !TRUE METRIX BLOOD GLUCOSE: 1 days supply | Qty: 1 | Fill #0

## 2020-09-25 MED FILL — TRUE METRIX GLUCOSE TEST ST: 25 days supply | Qty: 100 | Fill #0

## 2020-09-25 MED FILL — FARXIGA 10 MG TABLET: 10 | 30 days supply | Qty: 30 | Fill #0

## 2020-10-16 ENCOUNTER — Other Ambulatory Visit: Payer: Self-pay

## 2020-10-16 ENCOUNTER — Other Ambulatory Visit: Payer: Self-pay | Admitting: Nurse Practitioner

## 2020-10-16 ENCOUNTER — Ambulatory Visit: Payer: 59 | Attending: Nurse Practitioner | Admitting: Pharmacist

## 2020-10-16 ENCOUNTER — Telehealth: Payer: Self-pay | Admitting: Nurse Practitioner

## 2020-10-16 ENCOUNTER — Encounter: Payer: Self-pay | Admitting: Pharmacist

## 2020-10-16 DIAGNOSIS — E1165 Type 2 diabetes mellitus with hyperglycemia: Secondary | ICD-10-CM | POA: Diagnosis not present

## 2020-10-16 DIAGNOSIS — E039 Hypothyroidism, unspecified: Secondary | ICD-10-CM | POA: Diagnosis not present

## 2020-10-16 DIAGNOSIS — I10 Essential (primary) hypertension: Secondary | ICD-10-CM | POA: Diagnosis not present

## 2020-10-16 DIAGNOSIS — E785 Hyperlipidemia, unspecified: Secondary | ICD-10-CM

## 2020-10-16 LAB — GLUCOSE, POCT (MANUAL RESULT ENTRY): POC Glucose: 206 mg/dl — AB (ref 70–99)

## 2020-10-16 MED ORDER — DAPAGLIFLOZIN PROPANEDIOL 10 MG PO TABS: 10.0000 mg | ORAL_TABLET | Freq: Every day | ORAL | 3 refills | Status: DC

## 2020-10-16 MED ORDER — ATORVASTATIN CALCIUM 80 MG PO TABS: 80.0000 mg | ORAL_TABLET | Freq: Every day | ORAL | 3 refills | Status: DC

## 2020-10-16 MED ORDER — LOSARTAN POTASSIUM-HCTZ 100-25 MG PO TABS: 1.0000 | ORAL_TABLET | Freq: Every day | ORAL | 3 refills | Status: DC

## 2020-10-16 MED ORDER — LEVOTHYROXINE SODIUM 150 MCG PO TABS: 150.0000 ug | ORAL_TABLET | Freq: Every day | ORAL | 3 refills | Status: DC

## 2020-10-16 MED ORDER — INSULIN GLARGINE 100 UNIT/ML SOLOSTAR PEN: 27.0000 [IU] | PEN_INJECTOR | Freq: Every day | SUBCUTANEOUS | 11 refills | Status: DC

## 2020-10-16 MED ORDER — AMLODIPINE BESYLATE 10 MG PO TABS: 10.0000 mg | ORAL_TABLET | Freq: Every day | ORAL | 3 refills | Status: DC

## 2020-10-16 MED ORDER — METFORMIN HCL 1000 MG PO TABS: 1000.0000 mg | ORAL_TABLET | Freq: Two times a day (BID) | ORAL | 3 refills | Status: DC

## 2020-10-16 MED FILL — LANTUS SOLOSTAR 100 UNITS/M: 100 | 55 days supply | Qty: 15 | Fill #0

## 2020-10-16 MED FILL — FARXIGA 10 MG TABLET: 10 | 30 days supply | Qty: 30 | Fill #0

## 2020-10-16 NOTE — Telephone Encounter (Signed)
Copied from CRM [id number]. Topic: Quick Communication - See Telephone Encounter >> Oct 16, 2020 11:50 AM Aretta Nip wrote: CRM for notification. See Telephone encounter for: 10/16/20.Pt was in office this am and states her appt was to be sch for 11/13/20 and waiting to know if Franky Macho can change and cb with confirmation cb pt at [phone]   Called patient and LVM letting her know that her appointment with Franky Macho could not be switched to the 16th because unfortunately Franky Macho will not be in the office that day. Advised patient we could get it rescheduled to another day if that would work best for her. Advised patient to call [phone] with any questions or concerns.

## 2020-10-16 NOTE — Progress Notes (Signed)
S:     PCP: Bertram Denver  Patient arrives in good spirits. Presents for diabetes evaluation, education, and management.Patient was referred and last seen by Primary Care Provider on 09/18/20. Reported running out of meds 1 week prior to visit and not checking sugars. BG was 263 at that visit; pt was negative for ketones. Lantus was increased to 25 units daily.  Today, patient reports self-adjusting Lantus from 25 units to 27 units daily due to increased night sweats. Reports history of night sweats, however, these have worsened recently. Denies checking sugars during night sweat episodes and denies hypoglycemia. Glucometer FBG range 170-low 200s and PBG range 180-280s. Reports sometimes missing evening dose of metformin due to busy schedule. Reports not starting Marcelline Deist - pt unware prescription was sent to [facility] pharmacy. However, pt reported [facility] does not accept [facility] and requested prescriptions to be sent to another pharmacy. Additionally, pt cannot recall reason Trulicity was discontinued in the past.  Family/Social History:  FHx: MI (MGM, MGF), thyroid disease (mother) Tobacco: never smoker Alcohol: denies intake  Insurance coverage/medication affordability: [employer]  Medication adherence reported fair.   Current diabetes medications include: Lantus 25 units daily (taking 27 units daily), Farxiga 10 mg daily (not taking), metformin 1000 mg BID (missing evening dose) Previously tried medications: Victoza 1.8 mg daily (N/VD), Trulicity (reason unknown) Current hypertension medications include: amlodipine 10 mg daily, losartan-HCTZ 100-25 mg daily Current hyperlipidemia medications include: atorvastatin 80 mg daily  Patient denies hypoglycemic events.  Patient reported dietary habits: Eats 2-3 meals/day Breakfast: oatmeal, fruit Lunch: bojangles,  Dinner: fried food Snacks: fruits, chips, chocolate candy, pepsi  Drinks: pepsi, not drinking enough  water  Patient reports neuropathy (nerve pain).   O:  POCT: 206 (fasting)  Home fasting blood sugars: 200, 219, 173, 176, 155, 169, 201 2 hour post-meal/random blood sugars: 202, 286, 302, 246, 283, 182, 180  Lab Results  Component Value Date   HGBA1C 9.2 (A) 09/18/2020   There were no vitals filed for this visit.  Lipid Panel     Component Value Date/Time   CHOL 120 09/18/2020 1055   TRIG 103 09/18/2020 1055   HDL 42 09/18/2020 1055   CHOLHDL 2.9 09/18/2020 1055   CHOLHDL 5.0 01/31/2018 1845   VLDL 44 (H) 01/31/2018 1845   LDLCALC 59 09/18/2020 1055    Clinical Atherosclerotic Cardiovascular Disease (ASCVD): No  The ASCVD Risk score Denman George DC Jr., et al., 2013) failed to calculate for the following reasons:   The valid total cholesterol range is 130 to 320 mg/dL    A/P: Diabetes longstanding currently uncontrolled. Patient is able to verbalize appropriate hypoglycemia management plan. Medication adherence appears fair. Encouraged patient to use alarms to remember evening dose of metformin. Patient verbalized understanding. Encouraged patient to aim for a diet full of vegetables, fruit and lean meats (chicken, [place], fish) and to limit carbs (bread, pasta, sugar, rice) and red meat consumption. Encouraged patient to exercise 20-30 minutes daily with the goal of 150 minutes per week. Patient verbalized understanding. -Resent Farxiga 10 mg daily to preferred pharmacy [facility] outpatient) -Continued Lantus 27 units daily -Continued metformin 1000 mg BID -Check UACR today -Extensively discussed pathophysiology of diabetes, recommended lifestyle interventions, dietary effects on blood sugar control -Counseled on s/sx of and management of hypoglycemia -Next A1C anticipated April 2022.   ASCVD risk - primary prevention in patient with diabetes. Last LDL is controlled Moderate or high intensity statin indicated. -Continued atorvastatin 80 mg daily  Written patient instructions  provided.  Total time in face to face counseling 25 minutes.   Follow up Pharmacist Clinic Visit in 4 weeks.    Fabio Neighbors, PharmD, BCPS PGY2 Ambulatory Care Resident [facility]  Pharmacy

## 2020-10-17 LAB — MICROALBUMIN / CREATININE URINE RATIO
Creatinine, Urine: 88.7 mg/dL
Microalb/Creat Ratio: 13 mg/g creat (ref 0–29)
Microalbumin, Urine: 11.5 ug/mL

## 2020-11-14 ENCOUNTER — Other Ambulatory Visit: Payer: Self-pay

## 2020-11-14 ENCOUNTER — Other Ambulatory Visit: Payer: Self-pay | Admitting: Family Medicine

## 2020-11-14 ENCOUNTER — Ambulatory Visit: Payer: 59 | Attending: Nurse Practitioner | Admitting: Pharmacist

## 2020-11-14 DIAGNOSIS — E1165 Type 2 diabetes mellitus with hyperglycemia: Secondary | ICD-10-CM | POA: Diagnosis not present

## 2020-11-14 LAB — GLUCOSE, POCT (MANUAL RESULT ENTRY): POC Glucose: 144 mg/dl — AB (ref 70–99)

## 2020-11-14 MED ORDER — INSULIN GLARGINE 100 UNIT/ML SOLOSTAR PEN: 30.0000 [IU] | PEN_INJECTOR | Freq: Every day | SUBCUTANEOUS | 11 refills | Status: DC

## 2020-11-14 NOTE — Progress Notes (Signed)
    S:     PCP: Bertram Denver  Patient arrives in good spirits. Presents for diabetes evaluation, education, and management. Patient was referred and last seen by Primary Care Provider on 09/18/20. Pharmacy saw her on 10/16/2020. We did not make changes but we reordered her Marcelline Deist as she was out of refills.  Family/Social History:  FHx: MI (MGM, MGF), thyroid disease (mother) Tobacco: never smoker Alcohol: denies intake  Insurance coverage/medication affordability: [employer]  Medication adherence reported.   Current diabetes medications include: Lantus 27 units daily, Farxiga 10 mg daily, metformin 1000 mg BID Previously tried medications: Victoza 1.8 mg daily (N/VD), Trulicity (reason unknown) Current hypertension medications include: amlodipine 10 mg daily, losartan-HCTZ 100-25 mg daily Current hyperlipidemia medications include: atorvastatin 80 mg daily  Patient denies hypoglycemic events.  Patient reported dietary habits: Eats 2-3 meals/day Breakfast: oatmeal, fruit Lunch: bojangles,  Dinner: fried food Snacks: fruits, chips, chocolate candy, pepsi  Drinks: pepsi, not drinking enough water  Patient reports neuropathy (nerve pain).   O:  POCT: 144 (fasting)  Home fasting blood sugars: mainly 140s-160s 2 hour post-meal/random blood sugars: 180 - 200s. No values > 220 since last visit.   Lab Results  Component Value Date   HGBA1C 9.2 (A) 09/18/2020   There were no vitals filed for this visit.  Lipid Panel     Component Value Date/Time   CHOL 120 09/18/2020 1055   TRIG 103 09/18/2020 1055   HDL 42 09/18/2020 1055   CHOLHDL 2.9 09/18/2020 1055   CHOLHDL 5.0 01/31/2018 1845   VLDL 44 (H) 01/31/2018 1845   LDLCALC 59 09/18/2020 1055    Clinical Atherosclerotic Cardiovascular Disease (ASCVD): No  The ASCVD Risk score Denman George DC Jr., et al., 2013) failed to calculate for the following reasons:   The valid total cholesterol range is 130 to 320 mg/dL     A/P: Diabetes longstanding currently uncontrolled. Patient is able to verbalize appropriate hypoglycemia management plan. Medication adherence appears fair. Encouraged patient to use alarms to remember evening dose of metformin. Patient verbalized understanding. Encouraged patient to aim for a diet full of vegetables, fruit and lean meats (chicken, [place], fish) and to limit carbs (bread, pasta, sugar, rice) and red meat consumption. Encouraged patient to exercise 20-30 minutes daily with the goal of 150 minutes per week. Patient verbalized understanding. -Continue metformin and Farxiga.  -Change Lantus to AM dosing. Start taking 30u daily.  -Extensively discussed pathophysiology of diabetes, recommended lifestyle interventions, dietary effects on blood sugar control -Counseled on s/sx of and management of hypoglycemia -Next A1C anticipated April 2022.   ASCVD risk - primary prevention in patient with diabetes. Last LDL is controlled Moderate or high intensity statin indicated. -Continued atorvastatin 80 mg daily  Written patient instructions provided.  Total time in face to face counseling 25 minutes.   Follow up PCP Clinic Visit in April.    Butch Penny, PharmD, Patsy [place], CPP Clinical Pharmacist [facility] [phone]

## 2020-11-25 MED FILL — FARXIGA 10 MG TABLET: 10 | 30 days supply | Qty: 30 | Fill #1

## 2020-12-11 ENCOUNTER — Telehealth: Payer: Self-pay | Admitting: Nurse Practitioner

## 2020-12-11 NOTE — Telephone Encounter (Signed)
Provider working virtual 4/19. Called Pt left vm that appt is virtual on 4/19. If in person is preferred call [phone] to reschedule appt.

## 2020-12-16 ENCOUNTER — Other Ambulatory Visit (HOSPITAL_COMMUNITY): Payer: Self-pay

## 2020-12-16 MED FILL — Levothyroxine Sodium Tab 150 MCG: ORAL | 90 days supply | Qty: 90 | Fill #0 | Status: CN

## 2020-12-16 MED FILL — Metformin HCl Tab 1000 MG: ORAL | 90 days supply | Qty: 180 | Fill #0 | Status: CN

## 2020-12-17 ENCOUNTER — Ambulatory Visit: Payer: 59 | Attending: Nurse Practitioner | Admitting: Nurse Practitioner

## 2020-12-17 ENCOUNTER — Other Ambulatory Visit (HOSPITAL_COMMUNITY): Payer: Self-pay

## 2020-12-17 ENCOUNTER — Other Ambulatory Visit: Payer: Self-pay

## 2020-12-17 ENCOUNTER — Encounter: Payer: Self-pay | Admitting: Nurse Practitioner

## 2020-12-17 DIAGNOSIS — E785 Hyperlipidemia, unspecified: Secondary | ICD-10-CM

## 2020-12-17 DIAGNOSIS — I10 Essential (primary) hypertension: Secondary | ICD-10-CM | POA: Diagnosis not present

## 2020-12-17 DIAGNOSIS — E039 Hypothyroidism, unspecified: Secondary | ICD-10-CM

## 2020-12-17 DIAGNOSIS — E1165 Type 2 diabetes mellitus with hyperglycemia: Secondary | ICD-10-CM

## 2020-12-17 DIAGNOSIS — Z1231 Encounter for screening mammogram for malignant neoplasm of breast: Secondary | ICD-10-CM

## 2020-12-17 DIAGNOSIS — Z1211 Encounter for screening for malignant neoplasm of colon: Secondary | ICD-10-CM

## 2020-12-17 DIAGNOSIS — Z1159 Encounter for screening for other viral diseases: Secondary | ICD-10-CM

## 2020-12-17 MED ORDER — DAPAGLIFLOZIN PROPANEDIOL 10 MG PO TABS
10.0000 mg | ORAL_TABLET | Freq: Every day | ORAL | 0 refills | Status: DC
  Filled 2020-12-17: qty 90, 90d supply, fill #0

## 2020-12-17 MED ORDER — INSULIN PEN NEEDLE 31G X 5 MM MISC
1 refills | Status: DC
  Filled 2020-12-17: qty 100, 90d supply, fill #0

## 2020-12-17 MED ORDER — ATORVASTATIN CALCIUM 80 MG PO TABS
80.0000 mg | ORAL_TABLET | Freq: Every day | ORAL | 3 refills | Status: DC
  Filled 2020-12-17: qty 90, 90d supply, fill #0
  Filled 2021-03-10: qty 30, 30d supply, fill #1

## 2020-12-17 MED ORDER — LOSARTAN POTASSIUM-HCTZ 100-25 MG PO TABS
1.0000 | ORAL_TABLET | Freq: Every day | ORAL | 0 refills | Status: DC
  Filled 2020-12-17: qty 90, 90d supply, fill #0

## 2020-12-17 MED ORDER — AMLODIPINE BESYLATE 10 MG PO TABS
10.0000 mg | ORAL_TABLET | Freq: Every day | ORAL | 0 refills | Status: DC
  Filled 2020-12-17: qty 90, 90d supply, fill #0

## 2020-12-17 MED ORDER — GLUCOSE BLOOD VI STRP
ORAL_STRIP | 12 refills | Status: AC
  Filled 2020-12-17: qty 100, 50d supply, fill #0
  Filled 2021-10-17: qty 50, 30d supply, fill #0
  Filled 2021-10-17: qty 100, 50d supply, fill #0

## 2020-12-17 MED ORDER — FREESTYLE LITE W/DEVICE KIT
1.0000 | PACK | Freq: Two times a day (BID) | 0 refills | Status: AC
  Filled 2020-12-17: qty 1, 1d supply, fill #0

## 2020-12-17 MED ORDER — INSULIN GLARGINE 100 UNIT/ML SOLOSTAR PEN
30.0000 [IU] | PEN_INJECTOR | Freq: Every day | SUBCUTANEOUS | 11 refills | Status: DC
  Filled 2020-12-17: qty 15, 50d supply, fill #0
  Filled 2021-02-06 – 2021-02-07 (×2): qty 15, 50d supply, fill #1
  Filled 2021-03-10: qty 9, 30d supply, fill #1

## 2020-12-17 MED ORDER — LEVOTHYROXINE SODIUM 150 MCG PO TABS
150.0000 ug | ORAL_TABLET | Freq: Every day | ORAL | 0 refills | Status: DC
Start: 2020-12-17 — End: 2020-12-26
  Filled 2020-12-17 (×2): qty 90, 90d supply, fill #0

## 2020-12-17 MED ORDER — FREESTYLE LANCETS MISC
12 refills | Status: DC
  Filled 2020-12-17: qty 100, 50d supply, fill #0
  Filled 2021-10-17: qty 100, 30d supply, fill #0
  Filled 2021-10-17: qty 100, 50d supply, fill #0

## 2020-12-17 MED ORDER — METFORMIN HCL 1000 MG PO TABS
1000.0000 mg | ORAL_TABLET | Freq: Two times a day (BID) | ORAL | 3 refills | Status: DC
  Filled 2020-12-17: qty 180, 90d supply, fill #0

## 2020-12-17 NOTE — Progress Notes (Signed)
Virtual Visit via Telephone Note Due to national recommendations of social distancing due to [place] 19, telehealth visit is felt to be most appropriate for this patient at this time.  I discussed the limitations, risks, security and privacy concerns of performing an evaluation and management service by telephone and the availability of in person appointments. I also discussed with the patient that there may be a patient responsible charge related to this service. The patient expressed understanding and agreed to proceed.    I connected with Diana Fleming on 12/17/20  at   8:30 AM EDT  EDT by telephone and verified that I am speaking with the correct person using two identifiers.   Consent I discussed the limitations, risks, security and privacy concerns of performing an evaluation and management service by telephone and the availability of in person appointments. I also discussed with the patient that there may be a patient responsible charge related to this service. The patient expressed understanding and agreed to proceed.   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   DM 2 Not well controlled.  She endorses medication adherence taking Farxiga 10 mg daily, Lantus 30 units in the evening and metformin 1000 mg twice daily.  Reports average home readings 130-150s.  Denies any symptoms of hypo or hyperglycemia. LDL at goal with atorvastatin 80 mg daily.  Lab Results  Component Value Date   HGBA1C 9.2 (A)  09/18/2020   Lab Results  Component Value Date   LDLCALC 59 09/18/2020   HTN Not well optimized. She endorses adherence taking amlodipine 10 mg daily, losartan-hydrochlorothiazide 100-25 mg daily. Denies chest pain, shortness of breath, palpitations, lightheadedness, dizziness, headaches or BLE edema.  BP Readings from Last 3 Encounters:  09/18/20 (!) 144/85  05/27/20 122/82  01/31/20 (!) 153/88    Hypothyroidism Suboptimal. She does have a history of non adherence with medication regimen. Currently prescribed synthroid 150 mg daily.  Lab Results  Component Value Date   TSH 0.045 (L) 09/18/2020   Past Medical History:  Diagnosis Date  . Diabetes mellitus, type 2 ([place])   . Dizziness 05/11/2019  . Dyslipidemia   . Essential hypertension 05/11/2019  . 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 Hypertension   . Hypothyroidism   . Pure hypercholesterolemia 05/11/2019    Past Surgical History:  Procedure Laterality Date  . DILATION AND CURETTAGE, DIAGNOSTIC / THERAPEUTIC  2002  . Radioiodine ablation    . TUBAL LIGATION      Family History  Problem Relation Age of Onset  . Cancer Mother   . Thyroid disease Mother   . Heart attack Maternal Grandfather   . Heart attack Maternal Grandmother     Social History   Socioeconomic History  . Marital status: Married    Spouse name: Not on file  . Number of children: Not on file  . Years of education: Not on file  . Highest education level: Not on file  Occupational History  . Not on file  Tobacco Use  . Smoking status: Never Smoker  . Smokeless tobacco: Never Used  Vaping Use  . Vaping Use: Never used  Substance and Sexual Activity  . Alcohol use: Never  . Drug use: Never  . Sexual activity: Yes  Other Topics Concern  . Not on file  Social History Narrative  . Not on file   Social Determinants of Health   Financial Resource Strain: Not on file  Food Insecurity: Not on file  Transportation Needs: Not on  file  Physical Activity: Not on file  Stress: Not on file  Social Connections: Not on file     Observations/Objective: Awake, alert and oriented x 3   Review of Systems  Constitutional: Negative for fever, malaise/fatigue and weight loss.  HENT: Negative.  Negative for nosebleeds.   Eyes: Negative.  Negative for blurred vision, double vision and photophobia.  Respiratory: Negative.  Negative for cough and shortness of breath.   Cardiovascular: Negative.  Negative for chest pain, palpitations and leg swelling.  Gastrointestinal: Negative.  Negative for heartburn, nausea and vomiting.  Musculoskeletal: Negative.  Negative for myalgias.  Neurological: Negative.  Negative for dizziness, focal weakness, seizures and headaches.  Psychiatric/Behavioral: Negative.  Negative for suicidal ideas.    Assessment and Plan: Diagnoses and all orders for this visit:  Type 2 diabetes mellitus with hyperglycemia, without long-term current use of insulin (HCC) -     metFORMIN (GLUCOPHAGE) 1000 MG tablet; Take 1 tablet (1,000 mg total) by mouth 2 (two) times daily with a meal. -     dapagliflozin propanediol (FARXIGA) 10 MG TABS tablet; Take 1 tablet (10 mg total) by mouth daily before breakfast. -     insulin glargine (LANTUS) 100 UNIT/ML Solostar Pen; Inject 30 Units into the skin daily at 10 pm. -     Insulin Pen Needle 31G X 5 MM MISC; USE AS DIRECTED -     Hemoglobin A1c; Future  Hyperlipidemia, unspecified hyperlipidemia type -     atorvastatin (LIPITOR) 80 MG tablet; Take 1 tablet (80 mg total) by mouth daily at 6pm  Essential hypertension -     losartan-hydrochlorothiazide (HYZAAR) 100-25 MG tablet; TAKE 1 TABLET BY MOUTH DAILY. -     amLODipine (NORVASC) 10 MG tablet; Take 1 tablet (10 mg total) by mouth daily. -     CMP14+EGFR; Future  Hypothyroidism, unspecified type -     levothyroxine (SYNTHROID) 150 MCG tablet; Take 1 tablet (150 mcg total) by mouth daily. -     TSH; Future -      T4, Free; Future  Need for hepatitis C screening test -     HCV Ab w Reflex to Quant PCR; Future  Breast cancer screening by mammogram -     MM DIGITAL SCREENING BILATERAL; Future  Colon cancer screening -     Fecal occult blood, imunochemical([facility]/Sun[facility]); Future  Other orders -     Blood Glucose Monitoring Suppl (FREESTYLE LITE) w/Device KIT; Use as instructed to test blood sugar 2 times daily, in the morning and the evening -     Lancets (FREESTYLE) lancets; Use as instructed -     glucose blood test strip; USE AS INSTRUCTED     Follow Up Instructions No follow-ups on file.     I discussed the assessment and treatment plan with the patient. The patient was provided an opportunity to ask questions and all were answered. The patient agreed with the plan and demonstrated an understanding of the instructions.   The patient was advised to call back or seek an in-person evaluation if the symptoms  worsen or if the condition fails to improve as anticipated.  I provided 17 minutes of non-face-to-face time during this encounter including median intraservice time, reviewing previous notes, labs, imaging, medications and explaining diagnosis and management.  Gildardo Pounds, FNP-BC

## 2020-12-25 ENCOUNTER — Ambulatory Visit: Payer: 59 | Attending: Nurse Practitioner

## 2020-12-25 ENCOUNTER — Other Ambulatory Visit: Payer: Self-pay

## 2020-12-25 DIAGNOSIS — Z1159 Encounter for screening for other viral diseases: Secondary | ICD-10-CM

## 2020-12-25 DIAGNOSIS — I10 Essential (primary) hypertension: Secondary | ICD-10-CM

## 2020-12-25 DIAGNOSIS — E039 Hypothyroidism, unspecified: Secondary | ICD-10-CM

## 2020-12-25 DIAGNOSIS — E1165 Type 2 diabetes mellitus with hyperglycemia: Secondary | ICD-10-CM

## 2020-12-26 ENCOUNTER — Other Ambulatory Visit: Payer: Self-pay | Admitting: Nurse Practitioner

## 2020-12-26 DIAGNOSIS — E039 Hypothyroidism, unspecified: Secondary | ICD-10-CM

## 2020-12-26 LAB — HCV AB W REFLEX TO QUANT PCR: HCV Ab: 0.2 s/co ratio (ref 0.0–0.9)

## 2020-12-26 LAB — CMP14+EGFR
ALT: 21 IU/L (ref 0–32)
AST: 14 IU/L (ref 0–40)
Albumin/Globulin Ratio: 2 (ref 1.2–2.2)
Albumin: 4.8 g/dL (ref 3.8–4.9)
Alkaline Phosphatase: 96 IU/L (ref 44–121)
BUN/Creatinine Ratio: 19 (ref 9–23)
BUN: 15 mg/dL (ref 6–24)
Bilirubin Total: 0.4 mg/dL (ref 0.0–1.2)
CO2: 23 mmol/L (ref 20–29)
Calcium: 9.6 mg/dL (ref 8.7–10.2)
Chloride: 101 mmol/L (ref 96–106)
Creatinine, Ser: 0.77 mg/dL (ref 0.57–1.00)
Globulin, Total: 2.4 g/dL (ref 1.5–4.5)
Glucose: 95 mg/dL (ref 65–99)
Potassium: 3.7 mmol/L (ref 3.5–5.2)
Sodium: 140 mmol/L (ref 134–144)
Total Protein: 7.2 g/dL (ref 6.0–8.5)
eGFR: 93 mL/min/{1.73_m2} (ref >59)

## 2020-12-26 LAB — TSH: TSH: 0.377 u[IU]/mL — ABNORMAL LOW (ref 0.450–4.500)

## 2020-12-26 LAB — HCV INTERPRETATION

## 2020-12-26 LAB — HEMOGLOBIN A1C
Est. average glucose Bld gHb Est-mCnc: 183 mg/dL
Hgb A1c MFr Bld: 8 % — ABNORMAL HIGH (ref 4.8–5.6)

## 2020-12-26 LAB — T4, FREE: Free T4: 1.55 ng/dL (ref 0.82–1.77)

## 2020-12-26 MED ORDER — LEVOTHYROXINE SODIUM 125 MCG PO TABS: 125.0000 ug | ORAL_TABLET | Freq: Every day | ORAL | 1 refills | Status: DC

## 2020-12-27 ENCOUNTER — Telehealth: Payer: Self-pay

## 2020-12-27 NOTE — Telephone Encounter (Signed)
Patient is returning a call regarding her labs.  Tried to reach the office 2x but no answer.  Please call patient back to discuss at [phone]

## 2020-12-27 NOTE — Telephone Encounter (Signed)
-----   Message from Zelda W Fleming, NP sent at 12/26/2020  7:17 PM EDT ----- Hep C negative. Kidney, liver function and electrolytes are normal.  Thyroid levels improved however still low. Will need to decrease thyroid medication and pick up new medication. Will also need you to have your labs drawn in 4 weeks. We should not wait 3 months to recheck. Please make a lab appt and pick up your thyroid medication from the pharmacy as soon as possible. A1c increased. I would like for you to increase your lantus to 40 units from 30 units.  

## 2020-12-27 NOTE — Telephone Encounter (Signed)
Attempted to call pt no answer lvm to return call.

## 2020-12-30 NOTE — Telephone Encounter (Signed)
Pt informed of labs and to return in 4 weeks to repeat thyroid levels. Pt also informed of medication change and increase in lantus to 40 units from 30 units. Pt verbalized understanding no other concerns voiced.

## 2020-12-30 NOTE — Telephone Encounter (Signed)
-----   Message from Zelda W Fleming, NP sent at 12/26/2020  7:17 PM EDT ----- Hep C negative. Kidney, liver function and electrolytes are normal.  Thyroid levels improved however still low. Will need to decrease thyroid medication and pick up new medication. Will also need you to have your labs drawn in 4 weeks. We should not wait 3 months to recheck. Please make a lab appt and pick up your thyroid medication from the pharmacy as soon as possible. A1c increased. I would like for you to increase your lantus to 40 units from 30 units.  

## 2020-12-30 NOTE — Telephone Encounter (Signed)
Attempted to call pt no answer no VM setup to leave message.  

## 2020-12-30 NOTE — Telephone Encounter (Signed)
-----   Message from Claiborne Rigg, NP sent at 12/26/2020  7:17 PM EDT ----- Hep C negative. Kidney, liver function and electrolytes are normal.  Thyroid levels improved however still low. Will need to decrease thyroid medication and pick up new medication. Will also need you to have your labs drawn in 4 weeks. We should not wait 3 months to recheck. Please make a lab appt and pick up your thyroid medication from the pharmacy as soon as possible. A1c increased. I would like for you to increase your lantus to 40 units from 30 units.

## 2020-12-30 NOTE — Telephone Encounter (Signed)
Attempted to call no answer no VM setup to leave message.

## 2021-01-09 ENCOUNTER — Other Ambulatory Visit: Payer: Self-pay | Admitting: Nurse Practitioner

## 2021-01-09 DIAGNOSIS — E1165 Type 2 diabetes mellitus with hyperglycemia: Secondary | ICD-10-CM

## 2021-01-10 ENCOUNTER — Other Ambulatory Visit (HOSPITAL_COMMUNITY): Payer: Self-pay

## 2021-02-05 ENCOUNTER — Ambulatory Visit: Payer: 59 | Attending: Nurse Practitioner

## 2021-02-05 ENCOUNTER — Other Ambulatory Visit: Payer: Self-pay

## 2021-02-05 ENCOUNTER — Other Ambulatory Visit: Payer: Self-pay | Admitting: Nurse Practitioner

## 2021-02-05 DIAGNOSIS — E039 Hypothyroidism, unspecified: Secondary | ICD-10-CM

## 2021-02-06 ENCOUNTER — Other Ambulatory Visit (HOSPITAL_COMMUNITY): Payer: Self-pay

## 2021-02-06 LAB — THYROID PANEL WITH TSH
Free Thyroxine Index: 5.3 — ABNORMAL HIGH (ref 1.2–4.9)
T3 Uptake Ratio: 33 % (ref 24–39)
T4, Total: 16.1 ug/dL — ABNORMAL HIGH (ref 4.5–12.0)
TSH: 0.017 u[IU]/mL — ABNORMAL LOW (ref 0.450–4.500)

## 2021-02-07 ENCOUNTER — Other Ambulatory Visit (HOSPITAL_COMMUNITY): Payer: Self-pay

## 2021-02-09 ENCOUNTER — Other Ambulatory Visit: Payer: Self-pay | Admitting: Nurse Practitioner

## 2021-02-09 DIAGNOSIS — E0590 Thyrotoxicosis, unspecified without thyrotoxic crisis or storm: Secondary | ICD-10-CM

## 2021-03-04 ENCOUNTER — Telehealth: Payer: Self-pay | Admitting: Nurse Practitioner

## 2021-03-04 NOTE — Telephone Encounter (Signed)
I return pt call, she was inform that we need a copy of her 2021 taxes, to apply for the financial program, Pt will call back when she has her taxes done

## 2021-03-04 NOTE — Telephone Encounter (Signed)
Pt is calling to schedule appt with Mikle Bosworth to schedule appt with [facility]. Please advise [phone]

## 2021-03-04 NOTE — Telephone Encounter (Signed)
Pt is calling to ask for medication assistance for Rx #: [id number]  insulin glargine (LANTUS) 100 UNIT/ML Solostar Pen [[id number]] patient is having problems with her insurance and is in need of assistance. CB- [phone]

## 2021-03-10 ENCOUNTER — Other Ambulatory Visit: Payer: Self-pay

## 2021-03-10 ENCOUNTER — Other Ambulatory Visit: Payer: Self-pay | Admitting: Nurse Practitioner

## 2021-03-10 DIAGNOSIS — I10 Essential (primary) hypertension: Secondary | ICD-10-CM

## 2021-03-10 DIAGNOSIS — E039 Hypothyroidism, unspecified: Secondary | ICD-10-CM

## 2021-03-10 NOTE — Telephone Encounter (Signed)
Including our MAP coordinator in this thread. Patient doesn't seem to have insurance, however, these rxs were sent to [facility] in April.   Diana Fleming,   Have we transferred these to our pharmacy, and if so, is this a patient on assistance for her insulin?

## 2021-03-11 ENCOUNTER — Other Ambulatory Visit: Payer: Self-pay

## 2021-03-11 MED ORDER — LOSARTAN POTASSIUM-HCTZ 100-25 MG PO TABS
1.0000 | ORAL_TABLET | Freq: Every day | ORAL | 0 refills | Status: DC
  Filled 2021-03-11: qty 30, 30d supply, fill #0

## 2021-03-11 MED ORDER — LEVOTHYROXINE SODIUM 125 MCG PO TABS
125.0000 ug | ORAL_TABLET | Freq: Every day | ORAL | 1 refills | Status: DC
  Filled 2021-03-11: qty 30, 30d supply, fill #0
  Filled 2021-04-09: qty 30, 30d supply, fill #1
  Filled 2021-05-09: qty 30, 30d supply, fill #2
  Filled 2021-06-09: qty 30, 30d supply, fill #3
  Filled 2021-07-10: qty 30, 30d supply, fill #4
  Filled 2021-08-07: qty 30, 30d supply, fill #5

## 2021-03-11 MED ORDER — AMLODIPINE BESYLATE 10 MG PO TABS
10.0000 mg | ORAL_TABLET | Freq: Every day | ORAL | 0 refills | Status: DC
  Filled 2021-03-11: qty 30, 30d supply, fill #0

## 2021-03-14 ENCOUNTER — Ambulatory Visit: Payer: Self-pay | Attending: Nurse Practitioner

## 2021-03-14 ENCOUNTER — Other Ambulatory Visit: Payer: Self-pay

## 2021-03-26 ENCOUNTER — Ambulatory Visit: Payer: Self-pay | Attending: Nurse Practitioner | Admitting: Physician Assistant

## 2021-03-26 ENCOUNTER — Encounter: Payer: Self-pay | Admitting: Physician Assistant

## 2021-03-26 ENCOUNTER — Other Ambulatory Visit: Payer: Self-pay

## 2021-03-26 DIAGNOSIS — R202 Paresthesia of skin: Secondary | ICD-10-CM

## 2021-03-26 DIAGNOSIS — M722 Plantar fascial fibromatosis: Secondary | ICD-10-CM

## 2021-03-26 DIAGNOSIS — I10 Essential (primary) hypertension: Secondary | ICD-10-CM

## 2021-03-26 DIAGNOSIS — E039 Hypothyroidism, unspecified: Secondary | ICD-10-CM

## 2021-03-26 DIAGNOSIS — E785 Hyperlipidemia, unspecified: Secondary | ICD-10-CM

## 2021-03-26 DIAGNOSIS — E1165 Type 2 diabetes mellitus with hyperglycemia: Secondary | ICD-10-CM

## 2021-03-26 MED ORDER — AMLODIPINE BESYLATE 10 MG PO TABS
10.0000 mg | ORAL_TABLET | Freq: Every day | ORAL | 1 refills | Status: DC
  Filled 2021-03-26: qty 90, 90d supply, fill #0
  Filled 2021-06-09: qty 30, 30d supply, fill #0
  Filled 2021-07-10: qty 30, 30d supply, fill #1

## 2021-03-26 MED ORDER — LOSARTAN POTASSIUM-HCTZ 100-25 MG PO TABS
1.0000 | ORAL_TABLET | Freq: Every day | ORAL | 1 refills | Status: DC
Start: 2021-03-26 — End: 2021-07-11
  Filled 2021-03-26: qty 90, 90d supply, fill #0
  Filled 2021-06-09: qty 30, 30d supply, fill #0
  Filled 2021-07-10: qty 30, 30d supply, fill #1

## 2021-03-26 MED ORDER — METFORMIN HCL 1000 MG PO TABS
1000.0000 mg | ORAL_TABLET | Freq: Two times a day (BID) | ORAL | 3 refills | Status: DC
  Filled 2021-03-26: qty 60, 30d supply, fill #0
  Filled 2021-06-09: qty 60, 30d supply, fill #1

## 2021-03-26 MED ORDER — DAPAGLIFLOZIN PROPANEDIOL 10 MG PO TABS
10.0000 mg | ORAL_TABLET | Freq: Every day | ORAL | 1 refills | Status: DC
  Filled 2021-03-26: qty 30, 30d supply, fill #0
  Filled 2021-06-14: qty 30, 30d supply, fill #1

## 2021-03-26 MED ORDER — METHOCARBAMOL 500 MG PO TABS
1000.0000 mg | ORAL_TABLET | Freq: Three times a day (TID) | ORAL | 0 refills | Status: DC | PRN
  Filled 2021-03-26: qty 90, 15d supply, fill #0

## 2021-03-26 MED ORDER — INSULIN GLARGINE 100 UNIT/ML SOLOSTAR PEN
30.0000 [IU] | PEN_INJECTOR | Freq: Every day | SUBCUTANEOUS | 11 refills | Status: DC
  Filled 2021-03-26 – 2021-04-09 (×2): qty 15, 50d supply, fill #0
  Filled 2021-06-09: qty 15, 50d supply, fill #1

## 2021-03-26 MED ORDER — ATORVASTATIN CALCIUM 80 MG PO TABS
80.0000 mg | ORAL_TABLET | Freq: Every day | ORAL | 3 refills | Status: DC
  Filled 2021-03-26: qty 90, 90d supply, fill #0

## 2021-03-26 MED ORDER — INSULIN PEN NEEDLE 31G X 5 MM MISC
1 refills | Status: AC
  Filled 2021-03-26: qty 100, 25d supply, fill #0
  Filled 2021-07-11: qty 100, 25d supply, fill #1
  Filled 2021-12-08: qty 100, 25d supply, fill #0

## 2021-03-26 NOTE — Progress Notes (Signed)
Virtual Visit via Telephone Note  I connected with Diana Fleming on 03/26/21 at  9:10 AM EDT by telephone and verified that I am speaking with the correct person using two identifiers.  Location: Patient: home Provider: [facility] office   I discussed the limitations, risks, security and privacy concerns of performing an evaluation and management service by telephone and the availability of in person appointments. I also discussed with the patient that there may be a patient responsible charge related to this service. The patient expressed understanding and agreed to proceed.   History of Present Illness: Blood sugars running from 110-140.  No issues or concerns with that.  Does not check BP at home.  For about 2 weeks she has been having paresthesias in her l arm when she bends forward or reaches for something.  Not bothersome any other times.  No CP/SOB.    Also having foot pain esp when she first steps out of bed.  NKI    Observations/Objective:  NAD.  A&Ox3   Assessment and Plan: 1. Type 2 diabetes mellitus with hyperglycemia, without long-term current use of insulin (HCC) Controlled based on home numbers - metFORMIN (GLUCOPHAGE) 1000 MG tablet; Take 1 tablet (1,000 mg total) by mouth 2 (two) times daily with a meal.  Dispense: 180 tablet; Refill: 3 - insulin glargine (LANTUS) 100 UNIT/ML Solostar Pen; Inject 30 Units into the skin daily at 10 pm.  Dispense: 15 mL; Refill: 11 - Insulin Pen Needle 31G X 5 MM MISC; USE AS DIRECTED  Dispense: 100 each; Refill: 1 - dapagliflozin propanediol (FARXIGA) 10 MG TABS tablet; Take 1 tablet (10 mg total) by mouth daily before breakfast.  Dispense: 90 tablet; Refill: 1 - Hemoglobin A1c; Future - Comprehensive metabolic panel; Future  2. Hyperlipidemia, unspecified hyperlipidemia type - atorvastatin (LIPITOR) 80 MG tablet; Take 1 tablet (80 mg total) by mouth daily at 6pm  Dispense: 90 tablet; Refill: 3 - Comprehensive metabolic panel; Future -  Lipid panel; Future  3. Essential hypertension - losartan-hydrochlorothiazide (HYZAAR) 100-25 MG tablet; TAKE 1 TABLET BY MOUTH DAILY.  Dispense: 90 tablet; Refill: 1 - amLODipine (NORVASC) 10 MG tablet; Take 1 tablet (10 mg total) by mouth daily.  Dispense: 90 tablet; Refill: 1 - Comprehensive metabolic panel; Future  4. Hypothyroidism, unspecified type She appears to have 1 90 day RF but can lmk if she needs an additonal RF - Thyroid Panel With TSH; Future  5. Plantar fasciitis - Ambulatory referral to Podiatry - methocarbamol (ROBAXIN) 500 MG tablet; Take 2 tablets (1,000 mg total) by mouth every 8 (eight) hours as needed for muscle spasms.  Dispense: 90 tablet; Refill: 0  6. Paresthesia - DG Cervical Spine 2 or 3 views - methocarbamol (ROBAXIN) 500 MG tablet; Take 2 tablets (1,000 mg total) by mouth every 8 (eight) hours as needed for muscle spasms.  Dispense: 90 tablet; Refill: 0    Follow Up Instructions: See PCP in 3-4 months   I discussed the assessment and treatment plan with the patient. The patient was provided an opportunity to ask questions and all were answered. The patient agreed with the plan and demonstrated an understanding of the instructions.   The patient was advised to call back or seek an in-person evaluation if the symptoms worsen or if the condition fails to improve as anticipated.  I provided 16 minutes of non-face-to-face time during this encounter.   Georgian Co, PA-C  Patient ID: Diana Fleming, female   DOB: 20-Mar-1966, 55 y.o.  MRN: [id number]

## 2021-03-31 ENCOUNTER — Other Ambulatory Visit: Payer: Self-pay

## 2021-04-01 ENCOUNTER — Telehealth: Payer: Self-pay | Admitting: Nurse Practitioner

## 2021-04-01 NOTE — Telephone Encounter (Signed)
Pt was sent a letter from financial dept. Inform them, that the application they submitted was incomplete, since they were missing some documentation at the time of the appointment, Pt need to reschedule and resubmit all new papers and application for [employer] and [place], P.S. old documents has been sent back by mail to the Pt and Pt. need to make a new appt. 

## 2021-04-04 ENCOUNTER — Other Ambulatory Visit: Payer: Self-pay

## 2021-04-09 ENCOUNTER — Other Ambulatory Visit: Payer: Self-pay

## 2021-04-10 ENCOUNTER — Other Ambulatory Visit: Payer: Self-pay

## 2021-05-09 ENCOUNTER — Ambulatory Visit (INDEPENDENT_AMBULATORY_CARE_PROVIDER_SITE_OTHER): Payer: PRIVATE HEALTH INSURANCE | Admitting: Endocrinology

## 2021-05-09 ENCOUNTER — Other Ambulatory Visit: Payer: Self-pay

## 2021-05-09 VITALS — BP 124/70 | HR 78 | Ht 72.0 in | Wt 189.8 lb

## 2021-05-09 DIAGNOSIS — E039 Hypothyroidism, unspecified: Secondary | ICD-10-CM

## 2021-05-09 LAB — T4, FREE: Free T4: 1.04 ng/dL (ref 0.60–1.60)

## 2021-05-09 LAB — TSH: TSH: 0.59 u[IU]/mL (ref 0.35–5.50)

## 2021-05-09 NOTE — Progress Notes (Signed)
Subjective:    Patient ID: Diana Fleming, female    DOB: 03/03/1966, 55 y.o.   MRN: [id number]  HPI Pt is referred by Geryl Rankins, NP, for hypothyroidism.  Pt reports hypothyroidism was dx'ed in 2010.  she has been on prescribed thyroid hormone therapy since rx.  She has been on the current 125/d, x 3 mos.  she has never taken kelp or any other type of non-prescribed thyroid product.  she had an uncertain type of thyroid imaging at [facility] radiology at time of dx.  she has never had thyroid surgery, or XRT to the neck.  she has never been on amiodarone or lithium.   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    Hypertension    Hypothyroidism    Pure hypercholesterolemia 05/11/2019    Past Surgical History:  Procedure Laterality Date   DILATION AND CURETTAGE, DIAGNOSTIC / THERAPEUTIC  2002   Radioiodine ablation     TUBAL LIGATION      Social History   Socioeconomic History   Marital status: Married    Spouse name: Not on file   Number of children: Not on file   Years of education: Not on file   Highest education level: Not on file  Occupational History   Not on file  Tobacco Use   Smoking status: Never   Smokeless tobacco: Never  Vaping Use   Vaping Use: Never used  Substance and Sexual Activity   Alcohol use: Never   Drug use: Never   Sexual activity: Yes  Other Topics Concern   Not on file  Social History Narrative   Not on file   Social Determinants of Health   Financial Resource Strain: Not on file  Food Insecurity: Not on file  Transportation Needs: Not on file  Physical Activity: Not on file  Stress: Not on file  Social Connections: Not on file  Intimate Partner Violence: Not on file    Current Outpatient Medications on File Prior to Visit  Medication Sig Dispense Refill   amLODipine (NORVASC) 10 MG  tablet Take 1 tablet (10 mg total) by mouth daily. 90 tablet 1   atorvastatin (LIPITOR) 80 MG tablet Take 1 tablet (80 mg total) by mouth daily at 6pm 90 tablet 3   Blood Glucose Monitoring Suppl (FREESTYLE LITE) w/Device KIT Use as instructed to test blood sugar 2 times daily, in the morning and the evening 1 kit 0   dapagliflozin propanediol (FARXIGA) 10 MG TABS tablet Take 1 tablet (10 mg total) by mouth daily before breakfast. 90 tablet 1   glucose blood test strip USE AS INSTRUCTED 100 strip 12   insulin glargine (LANTUS) 100 UNIT/ML Solostar Pen Inject 30 Units into the skin daily at 10 pm. 15 mL 11   Insulin Pen Needle 31G X 5 MM MISC USE AS DIRECTED 100 each 1   Lancets (FREESTYLE) lancets Use as instructed 100 each 12   levothyroxine (SYNTHROID) 125 MCG tablet Take 1 tablet (125 mcg total) by mouth daily. 90 tablet 1   losartan-hydrochlorothiazide (HYZAAR) 100-25 MG tablet TAKE 1 TABLET BY MOUTH DAILY. 90 tablet 1   metFORMIN (GLUCOPHAGE) 1000 MG tablet Take 1 tablet (1,000 mg total) by mouth 2 (two) times daily with a meal. 180 tablet 3   methocarbamol (ROBAXIN) 500 MG tablet Take  2 tablets (1,000 mg total) by mouth every 8 (eight) hours as needed for muscle spasms. 90 tablet 0   OneTouch Delica Lancets 53Y MISC Use as instructed to check blood sugar up to 3 times daily. E11.65 100 each 11   TRUEplus Lancets 28G MISC USE AS INSTRUCTED 100 each 12   No current facility-administered medications on file prior to visit.    No Known Allergies  Family History  Problem Relation Age of Onset   Cancer Mother    Thyroid disease Mother    Heart attack Maternal Grandfather    Heart attack Maternal Grandmother     BP 124/70 (BP Location: Right Arm, Patient Position: Sitting, Cuff Size: Normal)   Pulse 78   Ht 6' (1.829 m)   Wt 189 lb 12.8 oz (86.1 kg)   LMP 12/05/2012   SpO2 98%   BMI 25.74 kg/m     Review of Systems denies depression, weight gain, memory loss, constipation,  numbness, cold intolerance, and dry skin.      Objective:   Physical Exam VS: see vs page GEN: no distress HEAD: head: no deformity eyes: no periorbital swelling, no proptosis external nose and ears are normal NECK: supple, thyroid is not enlarged CHEST WALL: no deformity LUNGS: clear to auscultation CV: reg rate and rhythm, no murmur.  MUSCULOSKELETAL: gait is normal and steady EXTEMITIES: no deformity.  no leg edema NEURO:  readily moves all 4's.  sensation is intact to touch on all 4's SKIN:  Normal texture and temperature.  No rash or suspicious lesion is visible.   NODES:  None palpable at the neck.   PSYCH: alert, well-oriented.  Does not appear anxious nor depressed.     Lab Results  Component Value Date   TSH 0.017 (L) 02/05/2021   T4TOTAL 16.1 (H) 02/05/2021   I have reviewed outside records, and summarized: Pt was noted to have low TSH, and referred here.  Pt was working with Ander Gaster, at getting financial assistance  Lab Results  Component Value Date   TSH 0.59 05/09/2021   T4TOTAL 16.1 (H) 02/05/2021      Assessment & Plan:  Hypothyroidism: well-controlled.  Please continue the same synthroid.

## 2021-05-09 NOTE — Patient Instructions (Signed)
Blood tests are requested for you today.  We'll let you know about the results.  Please come back for a follow-up appointment in 6 months.   

## 2021-05-27 ENCOUNTER — Other Ambulatory Visit: Payer: Self-pay

## 2021-05-27 ENCOUNTER — Ambulatory Visit
Admission: RE | Admit: 2021-05-27 | Discharge: 2021-05-27 | Disposition: A | Discharge status: Home / Self Care | Payer: PRIVATE HEALTH INSURANCE | Source: Ambulatory Visit | Attending: Nurse Practitioner | Admitting: Nurse Practitioner

## 2021-05-27 DIAGNOSIS — Z1231 Encounter for screening mammogram for malignant neoplasm of breast: Secondary | ICD-10-CM

## 2021-06-02 ENCOUNTER — Other Ambulatory Visit: Payer: Self-pay

## 2021-06-09 ENCOUNTER — Other Ambulatory Visit: Payer: Self-pay

## 2021-06-13 ENCOUNTER — Other Ambulatory Visit: Payer: Self-pay

## 2021-06-16 ENCOUNTER — Other Ambulatory Visit: Payer: Self-pay

## 2021-06-17 ENCOUNTER — Other Ambulatory Visit: Payer: Self-pay

## 2021-06-30 ENCOUNTER — Other Ambulatory Visit: Payer: Self-pay

## 2021-07-10 ENCOUNTER — Other Ambulatory Visit: Payer: Self-pay

## 2021-07-11 ENCOUNTER — Ambulatory Visit: Payer: PRIVATE HEALTH INSURANCE | Attending: Nurse Practitioner | Admitting: Nurse Practitioner

## 2021-07-11 ENCOUNTER — Other Ambulatory Visit: Payer: Self-pay

## 2021-07-11 ENCOUNTER — Encounter: Payer: Self-pay | Admitting: Nurse Practitioner

## 2021-07-11 VITALS — BP 144/84 | HR 66 | Ht 72.0 in | Wt 194.0 lb

## 2021-07-11 DIAGNOSIS — E785 Hyperlipidemia, unspecified: Secondary | ICD-10-CM | POA: Diagnosis not present

## 2021-07-11 DIAGNOSIS — Z23 Encounter for immunization: Secondary | ICD-10-CM | POA: Diagnosis not present

## 2021-07-11 DIAGNOSIS — M722 Plantar fascial fibromatosis: Secondary | ICD-10-CM | POA: Diagnosis not present

## 2021-07-11 DIAGNOSIS — I10 Essential (primary) hypertension: Secondary | ICD-10-CM

## 2021-07-11 DIAGNOSIS — E1165 Type 2 diabetes mellitus with hyperglycemia: Secondary | ICD-10-CM | POA: Diagnosis not present

## 2021-07-11 LAB — POCT GLYCOSYLATED HEMOGLOBIN (HGB A1C): Hemoglobin A1C: 7.1 % — AB (ref 4.0–5.6)

## 2021-07-11 LAB — GLUCOSE, POCT (MANUAL RESULT ENTRY): POC Glucose: 107 mg/dl — AB (ref 70–99)

## 2021-07-11 MED ORDER — LOSARTAN POTASSIUM-HCTZ 100-25 MG PO TABS
1.0000 | ORAL_TABLET | Freq: Every day | ORAL | 1 refills | Status: DC
  Filled 2021-07-11: qty 90, 90d supply, fill #0
  Filled 2021-08-07: qty 30, 30d supply, fill #0
  Filled 2021-10-17: qty 30, 30d supply, fill #1
  Filled 2021-10-17: qty 30, 30d supply, fill #0
  Filled 2022-01-09: qty 30, 30d supply, fill #1

## 2021-07-11 MED ORDER — INSULIN GLARGINE-YFGN 100 UNIT/ML ~~LOC~~ SOPN
30.0000 [IU] | PEN_INJECTOR | Freq: Every day | SUBCUTANEOUS | 1 refills | Status: AC
  Filled 2021-07-11 – 2021-11-03 (×5): qty 9, 30d supply, fill #0
  Filled 2021-12-08 (×3): qty 9, 30d supply, fill #1

## 2021-07-11 MED ORDER — ATORVASTATIN CALCIUM 80 MG PO TABS
80.0000 mg | ORAL_TABLET | Freq: Every day | ORAL | 3 refills | Status: DC
  Filled 2021-07-11 – 2021-10-17 (×2): qty 30, 30d supply, fill #0
  Filled 2021-10-17: qty 90, 90d supply, fill #0
  Filled 2022-01-09: qty 30, 30d supply, fill #1

## 2021-07-11 MED ORDER — AMLODIPINE BESYLATE 10 MG PO TABS
10.0000 mg | ORAL_TABLET | Freq: Every day | ORAL | 1 refills | Status: DC
  Filled 2021-07-11: qty 90, 90d supply, fill #0
  Filled 2021-08-07: qty 30, 30d supply, fill #0
  Filled 2021-10-17: qty 30, 30d supply, fill #1
  Filled 2021-10-17: qty 30, 30d supply, fill #0
  Filled 2022-01-09: qty 30, 30d supply, fill #1

## 2021-07-11 MED ORDER — METFORMIN HCL 1000 MG PO TABS
1000.0000 mg | ORAL_TABLET | Freq: Two times a day (BID) | ORAL | 3 refills | Status: DC
  Filled 2021-07-11 – 2022-01-09 (×2): qty 60, 30d supply, fill #0

## 2021-07-11 MED ORDER — DAPAGLIFLOZIN PROPANEDIOL 10 MG PO TABS
10.0000 mg | ORAL_TABLET | Freq: Every day | ORAL | 1 refills | Status: DC
  Filled 2021-07-11 – 2021-07-24 (×3): qty 30, 30d supply, fill #0

## 2021-07-11 NOTE — Progress Notes (Signed)
For  Assessment & Plan:  Diana Fleming was seen today for diabetes.  Diagnoses and all orders for this visit:  Type 2 diabetes mellitus with hyperglycemia, without long-term current use of insulin (HCC) -     POCT glucose (manual entry) -     POCT glycosylated hemoglobin (Hb A1C) -     CMP14+EGFR -     insulin glargine-yfgn (SEMGLEE, YFGN,) 100 UNIT/ML Pen; Inject 30 Units into the skin daily. -     dapagliflozin propanediol (FARXIGA) 10 MG TABS tablet; Take 1 tablet (10 mg total) by mouth daily before breakfast. -     metFORMIN (GLUCOPHAGE) 1000 MG tablet; Take 1 tablet (1,000 mg total) by mouth 2 (two) times daily with a meal.  Essential hypertension -     CMP14+EGFR -     losartan-hydrochlorothiazide (HYZAAR) 100-25 MG tablet; TAKE 1 TABLET BY MOUTH DAILY. -     amLODipine (NORVASC) 10 MG tablet; Take 1 tablet (10 mg total) by mouth daily.  Acquired hypothyroidism Follow up with endocrinology as previously instructed  Plantar fasciitis -     Ambulatory referral to Podiatry She did not have insurance with her previous referral but now has new insurance. Would like referral placed again   Hyperlipidemia, unspecified hyperlipidemia type -     atorvastatin (LIPITOR) 80 MG tablet; Take 1 tablet (80 mg total) by mouth daily at 6pm   Patient has been counseled on age-appropriate routine health concerns for screening and prevention. These are reviewed and up-to-date. Referrals have been placed accordingly. Immunizations are up-to-date or declined.    Subjective:   Chief Complaint  Patient presents with   Diabetes    HPI Diana Fleming 55 y.o. female presents to office today for follow-up to diabetes mellitus, hypertension and dyslipidemia. She is seeing Dr. Loanne Drilling for her acquired hypothyroidism  DM/HPL Diabetes improving. Will not make any medication changes today. We discussed additional dietary modifications today. She will continue on metformin 1000 mg BID, semglee 30 units  daily (insurance would not pay for lantus or levemir) and farxiga 10 mg daily. LDL at goal with atorvastatin 80 mg daily.  Lab Results  Component Value Date   HGBA1C 7.1 (A) 07/11/2021    Lab Results  Component Value Date   HGBA1C 8.0 (H) 12/25/2020   Lab Results  Component Value Date   LDLCALC 59 09/18/2020       HTN Blood pressure is elevated today. She is currently prescribed Hyzaar 100-25 mg daily and amlodipine 10 mg daily. Endorses medication adherence.  BP Readings from Last 3 Encounters:  07/11/21 (!) 144/84  05/09/21 124/70  09/18/20 (!) 144/85       Review of Systems  Constitutional:  Negative for fever, malaise/fatigue and weight loss.  HENT: Negative.  Negative for nosebleeds.   Eyes: Negative.  Negative for blurred vision, double vision and photophobia.  Respiratory: Negative.  Negative for cough and shortness of breath.   Cardiovascular: Negative.  Negative for chest pain, palpitations and leg swelling.  Gastrointestinal: Negative.  Negative for heartburn, nausea and vomiting.  Musculoskeletal:  Negative for myalgias.       Plantar fasciitis  Neurological: Negative.  Negative for dizziness, focal weakness, seizures and headaches.  Psychiatric/Behavioral: Negative.  Negative for suicidal ideas.    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    Outpatient Medications Prior to Visit  Medication Sig Dispense Refill   Blood Glucose Monitoring Suppl (FREESTYLE LITE) w/Device KIT Use as instructed to test blood sugar 2 times daily, in the morning and the evening 1 kit 0   glucose blood test strip USE AS INSTRUCTED 100 strip 12   Insulin Pen Needle 31G X 5 MM MISC USE AS DIRECTED 100 each 1   Lancets (FREESTYLE) lancets Use as instructed 100 each 12   levothyroxine (SYNTHROID) 125 MCG tablet Take 1 tablet (125 mcg total) by mouth daily. 90 tablet 1   OneTouch Delica Lancets 37D MISC Use as instructed to check blood sugar up to 3 times daily. E11.65 100 each 11   TRUEplus Lancets 28G MISC USE AS INSTRUCTED 100 each 12   amLODipine (NORVASC) 10 MG tablet Take 1 tablet (10 mg total) by mouth daily. 90 tablet 1   atorvastatin (LIPITOR) 80 MG tablet Take 1 tablet (80 mg total) by mouth daily at 6pm 90 tablet 3   dapagliflozin propanediol (FARXIGA) 10 MG TABS tablet Take 1 tablet (10 mg total) by mouth daily before breakfast. 90 tablet 1   insulin glargine (LANTUS) 100 UNIT/ML Solostar Pen Inject 30 Units into the skin daily at 10 pm. 15 mL 11   losartan-hydrochlorothiazide (HYZAAR) 100-25 MG tablet TAKE 1 TABLET BY MOUTH DAILY. 90 tablet 1   metFORMIN (GLUCOPHAGE) 1000 MG tablet Take 1 tablet (1,000 mg total) by mouth 2 (two) times daily with a meal. 180 tablet 3   methocarbamol (ROBAXIN) 500 MG tablet Take 2 tablets (1,000 mg total) by mouth every 8 (eight) hours as needed for muscle spasms. 90 tablet 0   No facility-administered medications prior to visit.    No Known Allergies     Objective:    BP (!) 144/84   Pulse 66   Ht 6' (1.829 m)   Wt 194 lb (88 kg)   LMP 12/05/2012   SpO2 100%   BMI 26.31 kg/m  Wt Readings from Last 3 Encounters:  07/11/21 194 lb (88 kg)  05/09/21 189 lb 12.8 oz (86.1 kg)  09/18/20 194 lb (88 kg)    Physical Exam Vitals and nursing note reviewed.  Constitutional:      Appearance: She is  well-developed.  HENT:     Head: Normocephalic and atraumatic.  Cardiovascular:     Rate and Rhythm: Normal rate and regular rhythm.     Heart sounds: Normal heart sounds. No murmur heard.   No friction rub. No gallop.  Pulmonary:     Effort: Pulmonary effort is normal. No tachypnea or respiratory distress.     Breath sounds: Normal breath sounds. No decreased breath sounds, wheezing, rhonchi or rales.  Chest:     Chest wall: No tenderness.  Abdominal:     General: Bowel sounds are normal.     Palpations: Abdomen is soft.  Musculoskeletal:        General: Normal  range of motion.     Cervical back: Normal range of motion.  Skin:    General: Skin is warm and dry.  Neurological:     Mental Status: She is alert and oriented to person, place, and time.     Coordination: Coordination normal.  Psychiatric:        Behavior: Behavior normal. Behavior is cooperative.        Thought Content: Thought content normal.        Judgment: Judgment normal.         Patient has been counseled extensively about nutrition and exercise as well as the importance of adherence with medications and regular follow-up. The patient was given clear instructions to go to ER or return to medical center if symptoms don't improve, worsen or new problems develop. The patient verbalized understanding.   Follow-up: Return for PAP SMEAR.   Gildardo Pounds, FNP-BC [facility] and [place], [place]   07/11/2021, 12:06 PM

## 2021-07-12 LAB — CMP14+EGFR
ALT: 22 IU/L (ref 0–32)
AST: 17 IU/L (ref 0–40)
Albumin/Globulin Ratio: 1.8 (ref 1.2–2.2)
Albumin: 4.5 g/dL (ref 3.8–4.9)
Alkaline Phosphatase: 97 IU/L (ref 44–121)
BUN/Creatinine Ratio: 18 (ref 9–23)
BUN: 12 mg/dL (ref 6–24)
Bilirubin Total: 0.4 mg/dL (ref 0.0–1.2)
CO2: 26 mmol/L (ref 20–29)
Calcium: 9.4 mg/dL (ref 8.7–10.2)
Chloride: 103 mmol/L (ref 96–106)
Creatinine, Ser: 0.65 mg/dL (ref 0.57–1.00)
Globulin, Total: 2.5 g/dL (ref 1.5–4.5)
Glucose: 106 mg/dL — ABNORMAL HIGH (ref 70–99)
Potassium: 3.8 mmol/L (ref 3.5–5.2)
Sodium: 140 mmol/L (ref 134–144)
Total Protein: 7 g/dL (ref 6.0–8.5)
eGFR: 105 mL/min/{1.73_m2} (ref >59)

## 2021-07-18 ENCOUNTER — Other Ambulatory Visit: Payer: Self-pay

## 2021-07-23 ENCOUNTER — Other Ambulatory Visit: Payer: Self-pay

## 2021-07-28 ENCOUNTER — Other Ambulatory Visit: Payer: Self-pay

## 2021-07-31 ENCOUNTER — Other Ambulatory Visit: Payer: Self-pay

## 2021-08-04 ENCOUNTER — Other Ambulatory Visit: Payer: Self-pay

## 2021-08-07 ENCOUNTER — Other Ambulatory Visit: Payer: Self-pay

## 2021-08-13 ENCOUNTER — Telehealth: Payer: Self-pay

## 2021-08-13 NOTE — Telephone Encounter (Signed)
Sent My-Chart message

## 2021-08-15 ENCOUNTER — Other Ambulatory Visit: Payer: Self-pay

## 2021-08-22 ENCOUNTER — Ambulatory Visit: Payer: PRIVATE HEALTH INSURANCE | Admitting: Nurse Practitioner

## 2021-09-04 ENCOUNTER — Other Ambulatory Visit: Payer: Self-pay

## 2021-09-04 ENCOUNTER — Encounter: Payer: Self-pay | Admitting: Podiatry

## 2021-09-04 ENCOUNTER — Ambulatory Visit (INDEPENDENT_AMBULATORY_CARE_PROVIDER_SITE_OTHER): Payer: PRIVATE HEALTH INSURANCE

## 2021-09-04 ENCOUNTER — Ambulatory Visit (INDEPENDENT_AMBULATORY_CARE_PROVIDER_SITE_OTHER): Payer: PRIVATE HEALTH INSURANCE | Admitting: Podiatry

## 2021-09-04 DIAGNOSIS — M7662 Achilles tendinitis, left leg: Secondary | ICD-10-CM

## 2021-09-04 DIAGNOSIS — M722 Plantar fascial fibromatosis: Secondary | ICD-10-CM | POA: Diagnosis not present

## 2021-09-04 MED ORDER — TRIAMCINOLONE ACETONIDE 10 MG/ML IJ SUSP
10.0000 mg | Freq: Once | INTRAMUSCULAR | Status: AC
  Administered 2021-09-04: 10 mg

## 2021-09-04 MED ORDER — DICLOFENAC SODIUM 75 MG PO TBEC: 75.0000 mg | DELAYED_RELEASE_TABLET | Freq: Two times a day (BID) | ORAL | 2 refills | Status: DC

## 2021-09-04 NOTE — Patient Instructions (Signed)

## 2021-09-04 NOTE — Progress Notes (Signed)
Subjective:   Patient ID: Diana Fleming, female   DOB: 56 y.o.   MRN: [id number]   HPI Patient presents stating that she is having a lot of pain in the back of her left heel that is been present for a fairly long time and is intensified recently.  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  Patient does not smoke likes to be active.  Patient takes care of her diabetes has had it for a number of years does take insulin with her last A1c being 7.1   Review of Systems  All other systems reviewed and are negative.      Objective:  Physical Exam Vitals and nursing note reviewed.  Constitutional:      Appearance: She is well-developed.  Pulmonary:     Effort: Pulmonary effort is normal.  Musculoskeletal:        General: Normal range of motion.  Skin:    General: Skin is warm.  Neurological:     Mental Status: She is alert.    Neurovascular status intact muscle strength found to be adequate range of motion adequate mild equinus condition noted.  Patient is found to have moderate to intense discomfort on the posterior lateral aspect of the left heel at the insertion of the Achilles with no center or medial involvement.  There is good digital perfusion good weightbearing and muscle strength is adequate      Assessment:  Acute Achilles tendinitis left with inflammation that has intensified with probable bone spur formation     Plan:  H&P x-rays reviewed.  Today I discussed treatment and she would like anything to avoid surgery due to her type of work and I discussed injection explaining chances for rupture associated with it but I will keep it to the lateral side where pathology is.  She is willing to accept risk wants procedure and today I did sterile prep and I carefully injected the lateral side of the Achilles with 3 mg dexamethasone Kenalog 5 mg Xylocaine keep it away from the center  and medial portions of the Achilles.  I advised on reduced weightbearing ice therapy and gradual stretching exercises to start in approximately 1 week.  Dispensed heel lift placed on oral diclofenac and reappoint again in 4 weeks and may require more aggressive treatment if symptoms do not subside  X-rays indicate there is a large posterior spur of the left heel

## 2021-09-05 ENCOUNTER — Other Ambulatory Visit (HOSPITAL_COMMUNITY): Payer: Self-pay

## 2021-10-03 ENCOUNTER — Ambulatory Visit (INDEPENDENT_AMBULATORY_CARE_PROVIDER_SITE_OTHER): Payer: PRIVATE HEALTH INSURANCE | Admitting: Podiatry

## 2021-10-03 ENCOUNTER — Encounter: Payer: Self-pay | Admitting: Podiatry

## 2021-10-03 ENCOUNTER — Other Ambulatory Visit: Payer: Self-pay

## 2021-10-03 DIAGNOSIS — M7662 Achilles tendinitis, left leg: Secondary | ICD-10-CM

## 2021-10-03 NOTE — Progress Notes (Signed)
Subjective:   Patient ID: Diana Fleming, female   DOB: 56 y.o.   MRN: [id number]   HPI Patient states doing quite a bit better stating she still having some discomfort but only when she is been on her foot for too long   ROS      Objective:  Physical Exam  Neurovascular status intact significant diminishment discomfort plantar fascial left still present but only upon deep palpation     Assessment:  Planter fasciitis left improved present     Plan:  H&P reviewed condition recommended continuation of anti-inflammatories physical therapy and good support.  Patient will be seen back as symptoms indicate may require more aggressive treatment

## 2021-10-10 ENCOUNTER — Other Ambulatory Visit: Payer: Self-pay | Admitting: Nurse Practitioner

## 2021-10-10 ENCOUNTER — Other Ambulatory Visit: Payer: Self-pay

## 2021-10-10 DIAGNOSIS — E039 Hypothyroidism, unspecified: Secondary | ICD-10-CM

## 2021-10-10 MED ORDER — LEVOTHYROXINE SODIUM 125 MCG PO TABS
125.0000 ug | ORAL_TABLET | Freq: Every day | ORAL | 0 refills | Status: DC
  Filled 2021-10-10 – 2021-10-17 (×2): qty 30, 30d supply, fill #0

## 2021-10-16 ENCOUNTER — Other Ambulatory Visit: Payer: Self-pay

## 2021-10-17 ENCOUNTER — Other Ambulatory Visit: Payer: Self-pay

## 2021-10-17 ENCOUNTER — Ambulatory Visit: Payer: PRIVATE HEALTH INSURANCE | Attending: Nurse Practitioner | Admitting: Nurse Practitioner

## 2021-10-17 ENCOUNTER — Encounter: Payer: Self-pay | Admitting: Nurse Practitioner

## 2021-10-17 ENCOUNTER — Ambulatory Visit: Payer: PRIVATE HEALTH INSURANCE | Admitting: Nurse Practitioner

## 2021-10-17 VITALS — BP 173/94 | HR 73 | Resp 16 | Wt 197.4 lb

## 2021-10-17 DIAGNOSIS — E785 Hyperlipidemia, unspecified: Secondary | ICD-10-CM | POA: Diagnosis not present

## 2021-10-17 DIAGNOSIS — E1165 Type 2 diabetes mellitus with hyperglycemia: Secondary | ICD-10-CM

## 2021-10-17 DIAGNOSIS — Z1211 Encounter for screening for malignant neoplasm of colon: Secondary | ICD-10-CM

## 2021-10-17 DIAGNOSIS — E1169 Type 2 diabetes mellitus with other specified complication: Secondary | ICD-10-CM

## 2021-10-17 DIAGNOSIS — I10 Essential (primary) hypertension: Secondary | ICD-10-CM | POA: Diagnosis not present

## 2021-10-17 DIAGNOSIS — E039 Hypothyroidism, unspecified: Secondary | ICD-10-CM

## 2021-10-17 LAB — POCT GLYCOSYLATED HEMOGLOBIN (HGB A1C): HbA1c, POC (controlled diabetic range): 8.5 % — AB (ref 0.0–7.0)

## 2021-10-17 LAB — GLUCOSE, POCT (MANUAL RESULT ENTRY): POC Glucose: 157 mg/dl — AB (ref 70–99)

## 2021-10-17 MED ORDER — INSULIN GLARGINE-YFGN 100 UNIT/ML ~~LOC~~ SOPN
40.0000 [IU] | PEN_INJECTOR | Freq: Every day | SUBCUTANEOUS | 1 refills | Status: DC
  Filled 2021-10-17: qty 12, 30d supply, fill #0

## 2021-10-17 NOTE — Progress Notes (Signed)
Assessment & Plan:  Diana Fleming was seen today for hypertension and medication management.  Diagnoses and all orders for this visit:  Type 2 diabetes mellitus with hyperglycemia, without long-term current use of insulin (HCC) -     POCT glucose (manual entry) -     POCT glycosylated hemoglobin (Hb A1C) -     CMP14+EGFR -     insulin glargine-yfgn (SEMGLEE, YFGN,) 100 UNIT/ML Pen; Inject 40 Units into the skin at bedtime.  Colon cancer screening -     Fecal occult blood, imunochemical([facility])  Essential hypertension Continue all antihypertensives as prescribed.  Remember to bring in your blood pressure log with you for your follow up appointment.  DASH/Mediterranean Diets are healthier choices for HTN.    Acquired hypothyroidism -     Cancel: Thyroid Panel With TSH  Type 2 diabetes mellitus with hyperlipidemia (HCC) -     Lipid panel INSTRUCTIONS: Work on a low fat, heart healthy diet and participate in regular aerobic exercise program by working out at least 150 minutes per week; 5 days a week-30 minutes per day. Avoid red meat/beef/steak,  fried foods. junk foods, sodas, sugary drinks, unhealthy snacking, alcohol and smoking.  Drink at least 80 oz of water per day and monitor your carbohydrate intake daily.      Patient has been counseled on age-appropriate routine health concerns for screening and prevention. These are reviewed and up-to-date. Referrals have been placed accordingly. Immunizations are up-to-date or declined.    Subjective:   Chief Complaint  Patient presents with   Hypertension   Medication Management   HPI Diana Fleming 56 y.o. female presents to office today for follow up to chronic conditions. 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    Hypertension She  is not exercising and is not  consistently adherent to low salt diet.  She does not have a blood pressure log  today.  Blood pressure is not well controlled at home. She is not taking her blood pressure medications as prescribed.  Cardiac symptoms none.  Cardiovascular risk factors: diabetes mellitus, dyslipidemia, and hypertension. Use of agents associated with hypertension: none.  History of target organ damage: none. She is not taking blood pressur emei BP Readings from Last 3 Encounters:  10/17/21 (!) 173/94  07/11/21 (!) 144/84  05/09/21 124/70    Hyperlipidemia Patient presents for follow up to hyperlipidemia.  She is medication compliant with high intensity statin atorvastatin 80 mg daily.  She is not  consistently diet compliant and denies statin intolerance including myalgias.  Lab Results  Component Value Date   CHOL 120 09/18/2020   Lab Results  Component Value Date   HDL 42 09/18/2020   Lab Results  Component Value Date   LDLCALC 59 09/18/2020   Lab Results  Component Value Date   TRIG 103 09/18/2020   Lab Results  Component Value Date   CHOLHDL 2.9 09/18/2020     Diabetes Mellitus Type II Poorly controlled. She did not pick up the [place] and states it was too expensive over $300 She is currently taking 34-36 units of semglee [employer] at [employer] so diet is not optimal. I have instructed her to contact her insurance company to see what our medication options are for her.  She endorses nonadherence with medications as well.  Current diabetic medications include: metformn and semglee as listed above Eye exam  current (within one year): yes Weight trend: increasing steadily Prior visit with dietician: no Current home monitoring regimen:  infrequently Any episodes of hypoglycemia? no Is She on ACE inhibitor or angiotensin II receptor blocker?  Yes  Lab Results  Component Value Date   HGBA1C 8.5 (A) 10/17/2021   HGBA1C 7.1 (A) 07/11/2021   HGBA1C 8.0 (H)  12/25/2020         Review of Systems  Constitutional:  Negative for fever, malaise/fatigue and weight loss.  HENT: Negative.  Negative for nosebleeds.   Eyes: Negative.  Negative for blurred vision, double vision and photophobia.  Respiratory: Negative.  Negative for cough and shortness of breath.   Cardiovascular: Negative.  Negative for chest pain, palpitations and leg swelling.  Gastrointestinal: Negative.  Negative for heartburn, nausea and vomiting.  Musculoskeletal: Negative.  Negative for myalgias.  Neurological: Negative.  Negative for dizziness, focal weakness, seizures and headaches.  Psychiatric/Behavioral: Negative.  Negative for suicidal ideas.    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   Outpatient Medications Prior to Visit  Medication Sig Dispense Refill   amLODipine (NORVASC) 10 MG tablet Take 1 tablet (10 mg total) by mouth daily. 90 tablet 1   atorvastatin (LIPITOR) 80 MG tablet Take 1 tablet (80 mg total) by mouth daily at 6pm 90 tablet 3   Blood Glucose Monitoring Suppl (FREESTYLE LITE) w/Device KIT Use as instructed to test blood sugar 2 times daily, in the morning and the evening 1 kit 0   diclofenac (VOLTAREN) 75 MG EC tablet Take 1 tablet (75 mg total) by mouth 2 (two) times daily. 50 tablet 2   glucose blood test strip USE AS INSTRUCTED 100  strip 12   Insulin Pen Needle 31G X 5 MM MISC USE AS DIRECTED 100 each 1   Lancets (FREESTYLE) lancets Use as instructed 100 each 12   levothyroxine (SYNTHROID) 125 MCG tablet Take 1 tablet (125 mcg total) by mouth daily. 30 tablet 0   losartan-hydrochlorothiazide (HYZAAR) 100-25 MG tablet TAKE 1 TABLET BY MOUTH DAILY. 90 tablet 1   metFORMIN (GLUCOPHAGE) 1000 MG tablet Take 1 tablet (1,000 mg total) by mouth 2 (two) times daily with a meal. 180 tablet 3   OneTouch Delica Lancets 46N MISC Use as instructed to check blood sugar up to 3 times daily. E11.65 100 each 11   dapagliflozin propanediol (FARXIGA) 10 MG TABS tablet Take 1 tablet (10 mg total) by mouth daily before breakfast. 90 tablet 1   No facility-administered medications prior to visit.    No Known Allergies     Objective:    BP (!) 173/94    Pulse 73    Resp 16    Wt 197 lb 6.4 oz (89.5 kg)    LMP 12/05/2012  SpO2 94%    BMI 26.77 kg/m  Wt Readings from Last 3 Encounters:  10/17/21 197 lb 6.4 oz (89.5 kg)  07/11/21 194 lb (88 kg)  05/09/21 189 lb 12.8 oz (86.1 kg)    Physical Exam Vitals and nursing note reviewed.  Constitutional:      Appearance: She is well-developed.  HENT:     Head: Normocephalic and atraumatic.  Cardiovascular:     Rate and Rhythm: Normal rate and regular rhythm.     Heart sounds: Normal heart sounds. No murmur heard.   No friction rub. No gallop.  Pulmonary:     Effort: Pulmonary effort is normal. No tachypnea or respiratory distress.     Breath sounds: Normal breath sounds. No decreased breath sounds, wheezing, rhonchi or rales.  Chest:     Chest wall: No tenderness.  Abdominal:     General: Bowel sounds are normal.     Palpations: Abdomen is soft.  Musculoskeletal:        General: Normal range of motion.     Cervical back: Normal range of motion.  Skin:    General: Skin is warm and dry.  Neurological:     Mental Status: She is alert and oriented to person, place, and time.      Coordination: Coordination normal.  Psychiatric:        Behavior: Behavior normal. Behavior is cooperative.        Thought Content: Thought content normal.        Judgment: Judgment normal.         Patient has been counseled extensively about nutrition and exercise as well as the importance of adherence with medications and regular follow-up. The patient was given clear instructions to go to ER or return to medical center if symptoms don't improve, worsen or new problems develop. The patient verbalized understanding.   Follow-up: Return for PAP SMEAR.   Gildardo Pounds, FNP-BC [facility], [place]   10/17/2021, 1:00 PM

## 2021-10-18 LAB — CMP14+EGFR
ALT: 29 IU/L (ref 0–32)
AST: 18 IU/L (ref 0–40)
Albumin/Globulin Ratio: 1.8 (ref 1.2–2.2)
Albumin: 4.7 g/dL (ref 3.8–4.9)
Alkaline Phosphatase: 109 IU/L (ref 44–121)
BUN/Creatinine Ratio: 18 (ref 9–23)
BUN: 10 mg/dL (ref 6–24)
Bilirubin Total: 0.5 mg/dL (ref 0.0–1.2)
CO2: 26 mmol/L (ref 20–29)
Calcium: 9.9 mg/dL (ref 8.7–10.2)
Chloride: 103 mmol/L (ref 96–106)
Creatinine, Ser: 0.55 mg/dL — ABNORMAL LOW (ref 0.57–1.00)
Globulin, Total: 2.6 g/dL (ref 1.5–4.5)
Glucose: 153 mg/dL — ABNORMAL HIGH (ref 70–99)
Potassium: 3.9 mmol/L (ref 3.5–5.2)
Sodium: 142 mmol/L (ref 134–144)
Total Protein: 7.3 g/dL (ref 6.0–8.5)
eGFR: 110 mL/min/{1.73_m2} (ref >59)

## 2021-10-18 LAB — LIPID PANEL
Chol/HDL Ratio: 4 ratio (ref 0.0–4.4)
Cholesterol, Total: 196 mg/dL (ref 100–199)
HDL: 49 mg/dL (ref >39)
LDL Chol Calc (NIH): 130 mg/dL — ABNORMAL HIGH (ref 0–99)
Triglycerides: 93 mg/dL (ref 0–149)
VLDL Cholesterol Cal: 17 mg/dL (ref 5–40)

## 2021-10-20 ENCOUNTER — Other Ambulatory Visit: Payer: Self-pay

## 2021-10-24 ENCOUNTER — Other Ambulatory Visit: Payer: Self-pay

## 2021-10-27 ENCOUNTER — Other Ambulatory Visit: Payer: Self-pay

## 2021-10-29 NOTE — Progress Notes (Signed)
Cardiology Office Note:    Date:  11/04/2021   ID:  Sharnese, Ureta 1965-10-22, MRN [id number]  PCP:  Gildardo Pounds, NP   [facility] Providers Cardiologist:  Skeet Latch, MD      Referring MD: Gildardo Pounds, NP   Follow-up for hypertension and chest discomfort   History of Present Illness:    Diana Fleming is a 56 y.o. female with a hx of essential hypertension, type 2 diabetes, hyperlipidemia, dizziness, and hypothyroidism.  She was initially seen in the hospital by cardiology on 6/19.  This was in the setting of hypertensive urgency.  She also was noted to have exertional chest pain.  Her chest discomfort improved with blood pressure control.  She had a coronary CTA 02/02/2018 which showed a calcium score of 0 and no coronary disease.  Her echocardiogram at that time showed an LVEF of 60 to 65% and grade 1 DD.  She was also noted to have aortic valve sclerosis with no stenosis.  At that time she was not on antihypertensive medications.  Her medications were restarted at discharge.  She followed up with Diana Ferries, PA-C on 6/20.  She was doing well at that time.  She was seen by Dr. Oval Linsey on 05/27/2020.  During that time she continued to do well.  She had been walking her dog for 30 minutes to 60 minutes daily.  She denied chest pain or shortness of breath.  She had occasional shortness of breath with increased physical activity but not with normal activities.  She denied lower extremity swelling orthopnea and PND.  She was unaware of the benefit of low-sodium diet.  She was not checking her blood pressure regularly/often however, when she did check it it was well controlled.  She presents to the clinic today for follow-up evaluation states she has been eating fast food more regularly.  She works at a [employer].  She reports that she is tired when she gets home from work and has not been exercising as often.  We reviewed her recent lipid panel,  previous coronary calcium score, and echocardiogram.  She expressed understanding.  She also has not been taking her atorvastatin regularly.  I have asked her to increase the fiber in her diet, increase her physical activity, t and ake her atorvastatin regularly.  She also notes that she has had increased night sweats.  She denies palpitations with these episodes.  It appears to be related to menopause.  We will repeat her fasting lipids in 6 weeks and plan follow-up for 1 year.  Today she denies chest pain, shortness of breath, lower extremity edema, fatigue, palpitations, melena, hematuria, hemoptysis, diaphoresis, weakness, presyncope, syncope, orthopnea, and PND.   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     Allergies:   Patient has no known allergies.   Social History   Socioeconomic History   Marital status: Married  Spouse name: Not on file   Number of children: Not on file   Years of education: Not on file   Highest education level: Not on file  Occupational History   Not on file  Tobacco Use   Smoking status: Never   Smokeless tobacco: Never  Vaping Use   Vaping Use: Never used  Substance and Sexual Activity   Alcohol use: Never   Drug use: Never   Sexual activity: Yes  Other Topics Concern   Not on file  Social History Narrative   Not on file   Social Determinants of Health   Financial Resource Strain: Not on file  Food Insecurity:  Not on file  Transportation Needs: Not on file  Physical Activity: Not on file  Stress: Not on file  Social Connections: Not on file     Family History: The patient's family history includes Cancer in her mother; Heart attack in her maternal grandfather and maternal grandmother; Thyroid disease in her mother.  ROS:   Please see the history of present illness.     All other systems reviewed and are negative.   Risk Assessment/Calculations:           Physical Exam:    VS:  LMP 12/05/2012     Wt Readings from Last 3 Encounters:  10/17/21 197 lb 6.4 oz (89.5 kg)  07/11/21 194 lb (88 kg)  05/09/21 189 lb 12.8 oz (86.1 kg)     GEN:  Well nourished, well developed in no acute distress HEENT: Normal NECK: No JVD; No carotid bruits LYMPHATICS: No lymphadenopathy CARDIAC: RRR, no murmurs, rubs, gallops RESPIRATORY:  Clear to auscultation without rales, wheezing or rhonchi  ABDOMEN: Soft, non-tender, non-distended MUSCULOSKELETAL:  No edema; No deformity  SKIN: Warm and dry NEUROLOGIC:  Alert and oriented x 3 PSYCHIATRIC:  Normal affect    EKGs/Labs/Other Studies Reviewed:    The following studies were reviewed today:  Echocardiogram 02/01/2018  - Left ventricle: The cavity size was normal. Wall thickness was   increased in a pattern of mild LVH. Systolic function was normal.   The estimated ejection fraction was in the range of 60% to 65%.   Doppler parameters are consistent with abnormal left ventricular   relaxation (grade 1 diastolic dysfunction). The E/e&' ratio is   between 8-15, suggesting indeterminate LV filling pressure. - Aortic valve: Sclerosis without stenosis. There was no   regurgitation. - Left atrium: The atrium was normal in size. - Tricuspid valve: There was trivial regurgitation. - Pulmonary arteries: PA peak pressure: 17 mm Hg (S). - Inferior vena cava: The vessel was normal in size. The   respirophasic diameter changes were in the normal range (>=  50%),   consistent with normal central venous pressure.   Impressions:   - LVEF 60-65%, mild LVH, normal wall motion, grade 1 DD,   indeterminate LV filling pressure, aortic valve sclerosis, normal   LA size, trivial TR, RVSP 17 mmHg, normal IVC.  Coronary CTA 02/02/2018  1. Coronary artery calcium score 0 Agatston units, suggesting low risk for future cardiac events.   2.  No plaque or stenosis noted in the coronary tree.  EKG:  EKG is  ordered today.  The ekg ordered today demonstrates normal sinus rhythm nonspecific T wave abnormality 91 bpm  Recent Labs: 05/09/2021: TSH 0.59 10/17/2021: ALT 29; BUN 10; Creatinine, Ser 0.55; Potassium 3.9; Sodium 142  Recent Lipid Panel    Component Value Date/Time   CHOL 196 10/17/2021 1113  TRIG 93 10/17/2021 1113   HDL 49 10/17/2021 1113   CHOLHDL 4.0 10/17/2021 1113   CHOLHDL 5.0 01/31/2018 1845   VLDL 44 (H) 01/31/2018 1845   LDLCALC 130 (H) 10/17/2021 1113    ASSESSMENT & PLAN    Essential hypertension-BP today 110/76.  Well-controlled at home. Continue losartan, hydrochlorothiazide, amlodipine Heart healthy low-sodium diet-salty 6 given Increase physical activity as tolerated  Palpitations-denies further episodes of accelerated or irregular heartbeat.  Denies presyncope and syncope. Maintain p.o. hydration Increase physical activity as tolerated No plans for cardiac event monitor at this time.  Hyperlipidemia-LDL 130 on 10/17/2021 Continue atorvastatin Heart healthy low-sodium high-fiber diet Increase physical activity as tolerated Lipids 6 weeks   Disposition: Follow-up with Dr. Oval Linsey for me in 1 year.       Medication Adjustments/Labs and Tests Ordered: Current medicines are reviewed at length with the patient today.  Concerns regarding medicines are outlined above.  No orders of the defined types were placed in this encounter.  No orders of the defined types were placed in this encounter.   There are no  Patient Instructions on file for this visit.   Signed, Deberah Pelton, NP  11/04/2021 11:19 AM      Notice: This dictation was prepared with Dragon dictation along with smaller phrase technology. Any transcriptional errors that result from this process are unintentional and may not be corrected upon review.  I spent 14 minutes examining this patient, reviewing medications, and using patient centered shared decision making involving her cardiac care.  Prior to her visit I spent greater than 20 minutes reviewing her past medical history,  medications, and prior cardiac tests.

## 2021-11-03 ENCOUNTER — Other Ambulatory Visit: Payer: Self-pay

## 2021-11-05 ENCOUNTER — Encounter (HOSPITAL_BASED_OUTPATIENT_CLINIC_OR_DEPARTMENT_OTHER): Payer: Self-pay | Admitting: General Practice

## 2021-11-05 ENCOUNTER — Ambulatory Visit (INDEPENDENT_AMBULATORY_CARE_PROVIDER_SITE_OTHER): Payer: Self-pay | Admitting: General Practice

## 2021-11-05 ENCOUNTER — Other Ambulatory Visit: Payer: Self-pay

## 2021-11-05 VITALS — BP 110/76 | HR 91 | Ht 72.0 in | Wt 191.8 lb

## 2021-11-05 DIAGNOSIS — I10 Essential (primary) hypertension: Secondary | ICD-10-CM

## 2021-11-05 DIAGNOSIS — E785 Hyperlipidemia, unspecified: Secondary | ICD-10-CM

## 2021-11-05 DIAGNOSIS — R002 Palpitations: Secondary | ICD-10-CM

## 2021-11-05 NOTE — Patient Instructions (Signed)
Medication Instructions:  ?Your Physician recommend you continue on your current medication as directed.   ? ?*If you need a refill on your cardiac medications before your next appointment, please call your pharmacy* ? ? ?Lab Work: ?Please return for Lab work in 6 weeks for Fasting Lipid Panel and Liver Function Tests. You may come to the...  ? ?[facility] (3rd floor) ?[address], [place], [place]  ?Open: 8am-Noon and 1pm-4:30pm  ? ?[place] [facility] ?[place]  ? ?[employer]- Any location ? ?**no appointments needed** ? ?If you have labs (blood work) drawn today and your tests are completely normal, you will receive your results only by: ?MyChart Message (if you have MyChart) OR ?A paper copy in the mail ?If you have any lab test that is abnormal or we need to change your treatment, we will call you to review the results. ? ? ?Testing/Procedures: ?None ordered today  ? ? ?Follow-Up: ?At [facility], you and your health needs are our priority.  As part of our continuing mission to provide you with exceptional heart care, we have created designated Provider Care Teams.  These Care Teams include your primary Cardiologist (physician) and Advanced Practice Providers (APPs -  Physician Assistants and Nurse Practitioners) who all work together to provide you with the care you need, when you need it. ? ?We recommend signing up for the patient portal called "MyChart".  Sign up information is provided on this After Visit Summary.  MyChart is used to connect with patients for Virtual Visits (Telemedicine).  Patients are able to view lab/test results, encounter notes, upcoming appointments, etc.  Non-urgent messages can be sent to your provider as well.   ?To learn more about what you can do with MyChart, go to [email or web address].   ? ?Your next appointment:   ?1 year(s) ? ?The format for your next appointment:   ?In Person ? ?Provider:   ?Skeet Latch, MD{ ? ?Other Instructions ?Exercise recommendations: ?The American Heart Association recommends 150 minutes of moderate intensity exercise weekly. ?Try 30 minutes of moderate intensity exercise 4-5 times per week. ?This could include walking, jogging, or swimming. ? ?Please reduce intake of fast food!  ? ?High-Fiber Eating Plan ?Fiber, also called dietary fiber, is a type of carbohydrate. It is found foods such as fruits, vegetables, whole grains, and beans. A high-fiber diet can have many health benefits. Your health care provider may recommend a high-fiber diet to help: ?Prevent constipation. Fiber can make your bowel movements more regular. ?Lower your cholesterol. ?Relieve the following conditions: ?Inflammation of veins in the anus (hemorrhoids). ?Inflammation of specific areas of the digestive tract (uncomplicated diverticulosis). ?A problem of the large intestine, also called the colon, that sometimes causes pain and diarrhea (irritable bowel syndrome, or IBS). ?Prevent overeating as part of a weight-loss plan. ?Prevent heart disease, type 2 diabetes, and certain cancers. ?What are tips for following this plan? ?Reading food labels ? ?Check the nutrition facts label on food products for the amount of dietary fiber. Choose foods that have 5 grams of fiber or more per serving. ?The goals for recommended daily fiber intake include: ?Men (age 48 or younger): 34-38 g. ?Men (over age 65): 28-34 g. ?Women (age 54 or younger): 25-28 g. ?Women (over age 59): 22-25 g. ?Your daily fiber goal is _____________ g. ?Shopping ?Choose whole fruits and vegetables instead of processed forms, such as apple juice or applesauce. ?Choose a wide variety of high-fiber foods  such as avocados, lentils, oats, and kidney beans. ?Read the nutrition facts label of the foods you choose. Be aware of foods with added fiber. These foods often have high sugar and sodium amounts per serving. ?Cooking ?Use whole-grain flour for baking  and cooking. ?Cook with brown rice instead of white rice. ?Meal planning ?Start the day with a breakfast that is high in fiber, such as a cereal that contains 5 g of fiber or more per serving. ?Eat breads and cereals that are made with whole-grain flour instead of refined flour or white flour. ?Eat brown rice, bulgur wheat, or millet instead of white rice. ?Use beans in place of meat in soups, salads, and pasta dishes. ?Be sure that half of the grains you eat each day are whole grains. ?General information ?You can get the recommended daily intake of dietary fiber by: ?Eating a variety of fruits, vegetables, grains, nuts, and beans. ?Taking a fiber supplement if you are not able to take in enough fiber in your diet. It is better to get fiber through food than from a supplement. ?Gradually increase how much fiber you consume. If you increase your intake of dietary fiber too quickly, you may have bloating, cramping, or gas. ?Drink plenty of water to help you digest fiber. ?Choose high-fiber snacks, such as berries, raw vegetables, nuts, and popcorn. ?What foods should I eat? ?Fruits ?Berries. Pears. Apples. Oranges. Avocado. Prunes and raisins. Dried figs. ?Vegetables ?Sweet potatoes. Spinach. Kale. Artichokes. Cabbage. Broccoli. Cauliflower. Green peas. Carrots. Squash. ?Grains ?Whole-grain breads. Multigrain cereal. Oats and oatmeal. Brown rice. Barley. Bulgur wheat. [place]. Quinoa. Bran muffins. Popcorn. Rye wafer crackers. ?Meats and other proteins ?Navy beans, kidney beans, and pinto beans. Soybeans. Split peas. Lentils. Nuts and seeds. ?Dairy ?Fiber-fortified yogurt. ?Beverages ?Fiber-fortified soy milk. Fiber-fortified orange juice. ?Other foods ?Fiber bars. ?The items listed above may not be a complete list of recommended foods and beverages. Contact a dietitian for more information. ?What foods should I avoid? ?Fruits ?Fruit juice. Cooked, strained fruit. ?Vegetables ?Fried potatoes. Canned vegetables.  Well-cooked vegetables. ?Grains ?White bread. Pasta made with refined flour. White rice. ?Meats and other proteins ?Fatty cuts of meat. Fried chicken or fried fish. ?Dairy ?Milk. Yogurt. Cream cheese. Sour cream. ?Fats and oils ?Butters. ?Beverages ?Soft drinks. ?Other foods ?Cakes and pastries. ?The items listed above may not be a complete list of foods and beverages to avoid. Talk with your dietitian about what choices are best for you. ?Summary ?Fiber is a type of carbohydrate. It is found in foods such as fruits, vegetables, whole grains, and beans. ?A high-fiber diet has many benefits. It can help to prevent constipation, lower blood cholesterol, aid weight loss, and reduce your risk of heart disease, diabetes, and certain cancers. ?Increase your intake of fiber gradually. Increasing fiber too quickly may cause cramping, bloating, and gas. Drink plenty of water while you increase the amount of fiber you consume. ?The best sources of fiber include whole fruits and vegetables, whole grains, nuts, seeds, and beans. ?This information is not intended to replace advice given to you by your health care provider. Make sure you discuss any questions you have with your health care provider. ?Document Revised: 12/21/2019 Document Reviewed: 12/21/2019 ?Elsevier Patient Education ? [place]. ? ?' ?

## 2021-11-06 ENCOUNTER — Ambulatory Visit (INDEPENDENT_AMBULATORY_CARE_PROVIDER_SITE_OTHER): Payer: PRIVATE HEALTH INSURANCE | Admitting: Endocrinology

## 2021-11-06 VITALS — BP 130/82 | HR 87 | Ht 72.0 in | Wt 195.0 lb

## 2021-11-06 DIAGNOSIS — E039 Hypothyroidism, unspecified: Secondary | ICD-10-CM | POA: Diagnosis not present

## 2021-11-06 LAB — TSH: TSH: 0.5 u[IU]/mL (ref 0.35–5.50)

## 2021-11-06 LAB — T4, FREE: Free T4: 1.35 ng/dL (ref 0.60–1.60)

## 2021-11-06 NOTE — Patient Instructions (Addendum)
Blood tests are requested for you today.  We'll let you know about the results.  Please come back for a follow-up appointment in 6 months.   

## 2021-11-06 NOTE — Progress Notes (Signed)
? ?Subjective:  ? ? Patient ID: Diana Fleming, female    DOB: 12/14/1965, 56 y.o.   MRN: [id number] ? ?HPI ?Pt returns for f/u of  chronic primary hypothyroidism (dx'ed 2010; she has been on prescribed thyroid hormone therapy since rx; she had an uncertain type of thyroid imaging at [facility] radiology at time of dx).  pt states she feels well in general.  She takes synthroid as rx'ed.   ?Past Medical History:  ?Diagnosis Date  ? Diabetes mellitus, type 2 ([place])   ? Dizziness 05/11/2019  ? Dyslipidemia   ? Essential hypertension 05/11/2019  ? 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 Hypertension   ? Hypothyroidism   ? Pure hypercholesterolemia 05/11/2019  ? ? ?Past Surgical History:  ?Procedure Laterality Date  ? DILATION AND CURETTAGE, DIAGNOSTIC / THERAPEUTIC  2002  ? Radioiodine ablation    ? TUBAL LIGATION    ? ? ?Social History  ? ?Socioeconomic History  ? Marital status: Married  ?  Spouse name: Not on file  ? Number of children: Not on file  ? Years of education: Not on file  ? Highest education level: Not on file  ?Occupational History  ? Not on file  ?Tobacco Use  ? Smoking status: Never  ? Smokeless tobacco: Never  ?Vaping Use  ? Vaping Use: Never used  ?Substance and Sexual Activity  ? Alcohol use: Never  ? Drug use: Never  ? Sexual activity: Yes  ?Other Topics Concern  ? Not on file  ?Social History Narrative  ? Not on file  ? ?Social Determinants of Health  ? ?Financial Resource Strain: Not on file  ?Food Insecurity: Not on file  ?Transportation Needs: Not on file  ?Physical Activity: Not on file  ?Stress: Not on file  ?Social Connections: Not on file  ?Intimate Partner Violence: Not on file  ? ? ?Current Outpatient Medications on File Prior to Visit  ?Medication Sig Dispense Refill  ? amLODipine (NORVASC) 10 MG tablet Take 1 tablet (10 mg total) by mouth daily. 90 tablet 1  ? atorvastatin (LIPITOR) 80 MG tablet Take 1 tablet (80 mg total) by mouth daily at 6pm 90 tablet 3  ? Blood Glucose Monitoring  Suppl (FREESTYLE LITE) w/Device KIT Use as instructed to test blood sugar 2 times daily, in the morning and the evening 1 kit 0  ? diclofenac (VOLTAREN) 75 MG EC tablet Take 1 tablet (75 mg total) by mouth 2 (two) times daily. 50 tablet 2  ? glucose blood test strip USE AS INSTRUCTED 100 strip 12  ? insulin glargine-yfgn (SEMGLEE, YFGN,) 100 UNIT/ML Pen Inject 30 Units into the skin daily. 27 mL 1  ? Insulin Pen Needle 31G X 5 MM MISC USE AS DIRECTED 100 each 1  ? Lancets (FREESTYLE) lancets Use as instructed 100 each 12  ? levothyroxine (SYNTHROID) 125 MCG tablet Take 1 tablet (125 mcg total) by mouth daily. 30 tablet 0  ? losartan-hydrochlorothiazide (HYZAAR) 100-25 MG tablet TAKE 1 TABLET BY MOUTH DAILY. 90 tablet 1  ? metFORMIN (GLUCOPHAGE) 1000 MG tablet Take 1 tablet (1,000 mg total) by mouth 2 (two) times daily with a meal. 180 tablet 3  ? OneTouch Delica Lancets 68T MISC Use as instructed to check blood sugar up to 3 times daily. E11.65 100 each 11  ? ?No current facility-administered medications on file prior to visit.  ? ? ?No Known Allergies ? ?Family History  ?Problem Relation Age of Onset  ?  Cancer Mother   ? Thyroid disease Mother   ? Heart attack Maternal Grandfather   ? Heart attack Maternal Grandmother   ? ? ?BP 130/82   Pulse 87   Ht 6' (1.829 m)   Wt 195 lb (88.5 kg)   LMP 12/05/2012   SpO2 96%   BMI 26.45 kg/m?  ? ? ?Review of Systems ? ?   ?Objective:  ? Physical Exam ?VITAL SIGNS:  See vs page ?GENERAL: no distress ?NECK: There is no palpable thyroid enlargement.  No thyroid nodule is palpable.  No palpable lymphadenopathy at the anterior neck. ? ? ?Lab Results  ?Component Value Date  ? TSH 0.50 11/06/2021  ? T4TOTAL 16.1 (H) 02/05/2021  ? ?   ?Assessment & Plan:  ?Hypothyroidism: well-controlled.  Please continue the same synthroid ? ? ?

## 2021-12-08 ENCOUNTER — Other Ambulatory Visit: Payer: Self-pay

## 2021-12-09 ENCOUNTER — Other Ambulatory Visit: Payer: Self-pay

## 2021-12-18 LAB — HEPATIC FUNCTION PANEL
ALT: 21 IU/L (ref 0–32)
AST: 13 IU/L (ref 0–40)
Albumin: 4.4 g/dL (ref 3.8–4.9)
Alkaline Phosphatase: 110 IU/L (ref 44–121)
Bilirubin Total: 0.5 mg/dL (ref 0.0–1.2)
Bilirubin, Direct: 0.13 mg/dL (ref 0.00–0.40)
Total Protein: 7.2 g/dL (ref 6.0–8.5)

## 2021-12-18 LAB — LIPID PANEL
Chol/HDL Ratio: 2.6 ratio (ref 0.0–4.4)
Cholesterol, Total: 127 mg/dL (ref 100–199)
HDL: 48 mg/dL (ref >39)
LDL Chol Calc (NIH): 64 mg/dL (ref 0–99)
Triglycerides: 73 mg/dL (ref 0–149)
VLDL Cholesterol Cal: 15 mg/dL (ref 5–40)

## 2022-01-02 ENCOUNTER — Ambulatory Visit: Payer: PRIVATE HEALTH INSURANCE | Admitting: Nurse Practitioner

## 2022-01-09 ENCOUNTER — Other Ambulatory Visit: Payer: Self-pay | Admitting: Family Medicine

## 2022-01-09 ENCOUNTER — Other Ambulatory Visit: Payer: Self-pay

## 2022-01-09 ENCOUNTER — Encounter: Payer: Self-pay | Admitting: Nurse Practitioner

## 2022-01-09 ENCOUNTER — Other Ambulatory Visit: Payer: Self-pay | Admitting: Nurse Practitioner

## 2022-01-09 ENCOUNTER — Other Ambulatory Visit (HOSPITAL_COMMUNITY)
Admission: RE | Admit: 2022-01-09 | Discharge: 2022-01-09 | Disposition: A | Discharge status: Home / Self Care | Payer: PRIVATE HEALTH INSURANCE | Source: Ambulatory Visit | Attending: Nurse Practitioner | Admitting: Nurse Practitioner

## 2022-01-09 ENCOUNTER — Ambulatory Visit: Payer: PRIVATE HEALTH INSURANCE | Attending: Nurse Practitioner | Admitting: Nurse Practitioner

## 2022-01-09 VITALS — BP 134/89 | HR 81 | Wt 191.8 lb

## 2022-01-09 DIAGNOSIS — E039 Hypothyroidism, unspecified: Secondary | ICD-10-CM | POA: Diagnosis not present

## 2022-01-09 DIAGNOSIS — Z124 Encounter for screening for malignant neoplasm of cervix: Secondary | ICD-10-CM | POA: Insufficient documentation

## 2022-01-09 DIAGNOSIS — I10 Essential (primary) hypertension: Secondary | ICD-10-CM | POA: Diagnosis not present

## 2022-01-09 DIAGNOSIS — E1165 Type 2 diabetes mellitus with hyperglycemia: Secondary | ICD-10-CM | POA: Diagnosis not present

## 2022-01-09 DIAGNOSIS — E785 Hyperlipidemia, unspecified: Secondary | ICD-10-CM

## 2022-01-09 DIAGNOSIS — Z114 Encounter for screening for human immunodeficiency virus [HIV]: Secondary | ICD-10-CM

## 2022-01-09 MED ORDER — ATORVASTATIN CALCIUM 80 MG PO TABS: 80.0000 mg | ORAL_TABLET | Freq: Every day | ORAL | 3 refills | Status: DC

## 2022-01-09 MED ORDER — METFORMIN HCL 1000 MG PO TABS: 1000.0000 mg | ORAL_TABLET | Freq: Two times a day (BID) | ORAL | 3 refills | Status: DC

## 2022-01-09 MED ORDER — AMLODIPINE BESYLATE 10 MG PO TABS: 10.0000 mg | ORAL_TABLET | Freq: Every day | ORAL | 1 refills | Status: DC

## 2022-01-09 MED ORDER — LOSARTAN POTASSIUM-HCTZ 100-25 MG PO TABS
1.0000 | ORAL_TABLET | Freq: Every day | ORAL | 1 refills | Status: DC
  Filled 2022-10-26: qty 30, 30d supply, fill #0

## 2022-01-09 MED ORDER — FREESTYLE LANCETS MISC
12 refills | Status: DC
  Filled 2022-01-09: qty 100, 30d supply, fill #0

## 2022-01-09 MED ORDER — LEVOTHYROXINE SODIUM 125 MCG PO TABS: 125.0000 ug | ORAL_TABLET | Freq: Every day | ORAL | 1 refills | Status: DC

## 2022-01-09 NOTE — Progress Notes (Signed)
? ?Assessment & Plan:  ?Diana Fleming was seen today for gynecologic exam. ? ?Diagnoses and all orders for this visit: ? ?Encounter for Papanicolaou smear for cervical cancer screening ?-     Cervicovaginal ancillary only ?-     Cytology - PAP ? ?Hypothyroidism, unspecified type ?-     levothyroxine (SYNTHROID) 125 MCG tablet; Take 1 tablet (125 mcg total) by mouth daily. ? ?Essential hypertension ?-     losartan-hydrochlorothiazide (HYZAAR) 100-25 MG tablet; TAKE 1 TABLET BY MOUTH DAILY. ?-     amLODipine (NORVASC) 10 MG tablet; Take 1 tablet (10 mg total) by mouth daily. ? ?Type 2 diabetes mellitus with hyperglycemia, without long-term current use of insulin (HCC) ?-     metFORMIN (GLUCOPHAGE) 1000 MG tablet; Take 1 tablet (1,000 mg total) by mouth 2 (two) times daily with a meal. ?-     Hemoglobin A1c ? ?Hyperlipidemia, unspecified hyperlipidemia type ?-     atorvastatin (LIPITOR) 80 MG tablet; Take 1 tablet (80 mg total) by mouth daily at 6pm ? ?Encounter for screening for HIV ?-     HIV antibody (with reflex) ? ? ? ?Patient has been counseled on age-appropriate routine health concerns for screening and prevention. These are reviewed and up-to-date. Referrals have been placed accordingly. Immunizations are up-to-date or declined.    ?Subjective:  ? ?Chief Complaint  ?Patient presents with  ? Gynecologic Exam  ? ?HPI ?Diana Fleming 56 y.o. female presents to office today for well woman exam. ? ?She does note bilateral twitching.  Endorses daily caffeine use which I have recommended she stop to see if this will help improve the twitching.  She denies any visual disturbances such as floaters, increased blurry vision or eye pain. ? ?Review of Systems  ?Constitutional: Negative.  Negative for chills, fever, malaise/fatigue and weight loss.  ?Respiratory: Negative.  Negative for cough, shortness of breath and wheezing.   ?Cardiovascular: Negative.  Negative for chest pain, orthopnea and leg swelling.  ?Gastrointestinal:   Negative for abdominal pain.  ?Genitourinary: Negative.  Negative for flank pain.  ?Skin: Negative.  Negative for rash.  ?Psychiatric/Behavioral:  Negative for suicidal ideas.   ? ?Past Medical History:  ?Diagnosis Date  ? Diabetes mellitus, type 2 ([place])   ? Dizziness 05/11/2019  ? Dyslipidemia   ? Essential hypertension 05/11/2019  ? 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 Hypertension   ? Hypothyroidism   ? Pure hypercholesterolemia 05/11/2019  ? ? ?Past Surgical History:  ?Procedure Laterality Date  ? DILATION AND CURETTAGE, DIAGNOSTIC / THERAPEUTIC  2002  ? Radioiodine ablation    ? TUBAL LIGATION    ? ? ?Family History  ?Problem Relation Age of Onset  ? Cancer Mother   ? Thyroid disease Mother   ? Heart attack Maternal Grandfather   ? Heart attack Maternal Grandmother   ? ? ?Social History Reviewed with no changes to be made today.  ? ?Outpatient Medications Prior to Visit  ?Medication Sig Dispense Refill  ? Blood Glucose Monitoring Suppl (FREESTYLE LITE) w/Device KIT Use as instructed to test blood sugar 2 times daily, in the morning and the evening 1 kit 0  ? diclofenac (VOLTAREN) 75 MG EC tablet Take 1 tablet (75 mg total) by mouth 2 (two) times daily. 50 tablet 2  ? Insulin Pen Needle 31G X 5 MM MISC USE AS DIRECTED 100 each 1  ? Lancets (FREESTYLE) lancets Use as instructed 100 each 12  ? OneTouch Delica Lancets 16X  MISC Use as instructed to check blood sugar up to 3 times daily. E11.65 100 each 11  ? amLODipine (NORVASC) 10 MG tablet Take 1 tablet (10 mg total) by mouth daily. 90 tablet 1  ? atorvastatin (LIPITOR) 80 MG tablet Take 1 tablet (80 mg total) by mouth daily at 6pm 90 tablet 3  ? losartan-hydrochlorothiazide (HYZAAR) 100-25 MG tablet TAKE 1 TABLET BY MOUTH DAILY. 90 tablet 1  ? metFORMIN (GLUCOPHAGE) 1000 MG tablet Take 1 tablet (1,000 mg total) by mouth 2 (two) times daily with a meal. 180 tablet 3  ? levothyroxine (SYNTHROID) 125 MCG tablet Take 1 tablet (125 mcg total) by mouth  daily. 30 tablet 0  ? ?No facility-administered medications prior to visit.  ? ? ?No Known Allergies ? ?   ?Objective:  ?  ?BP 134/89   Pulse 81   Wt 191 lb 12.8 oz (87 kg)   LMP 12/05/2012   SpO2 97%   BMI 26.01 kg/m?  ?Wt Readings from Last 3 Encounters:  ?01/09/22 191 lb 12.8 oz (87 kg)  ?11/06/21 195 lb (88.5 kg)  ?11/05/21 191 lb 12.8 oz (87 kg)  ? ? ?Physical Exam ?Exam conducted with a chaperone present.  ?Constitutional:   ?   Appearance: She is well-developed.  ?HENT:  ?   Head: Normocephalic.  ?Cardiovascular:  ?   Rate and Rhythm: Normal rate and regular rhythm.  ?   Heart sounds: Normal heart sounds.  ?Pulmonary:  ?   Effort: Pulmonary effort is normal.  ?   Breath sounds: Normal breath sounds.  ?Abdominal:  ?   General: Bowel sounds are normal.  ?   Palpations: Abdomen is soft.  ?   Hernia: There is no hernia in the left inguinal area.  ?Genitourinary: ?   Exam position: Lithotomy position.  ?   Labia:     ?   Right: No rash, tenderness, lesion or injury.     ?   Left: No rash, tenderness, lesion or injury.   ?   Vagina: Normal. No signs of injury and foreign body. No vaginal discharge, erythema, tenderness or bleeding.  ?   Cervix: Normal.  ?   Uterus: Not deviated and not enlarged.   ?   Adnexa:     ?   Right: No mass, tenderness or fullness.      ?   Left: No mass, tenderness or fullness.    ?   Rectum: Normal. No external hemorrhoid.  ?Lymphadenopathy:  ?   Lower Body: No right inguinal adenopathy. No left inguinal adenopathy.  ?Skin: ?   General: Skin is warm and dry.  ?Neurological:  ?   Mental Status: She is alert and oriented to person, place, and time.  ?Psychiatric:     ?   Behavior: Behavior normal.     ?   Thought Content: Thought content normal.     ?   Judgment: Judgment normal.  ? ? ? ? ?   ?Patient has been counseled extensively about nutrition and exercise as well as the importance of adherence with medications and regular follow-up. The patient was given clear instructions to go  to ER or return to medical center if symptoms don't improve, worsen or new problems develop. The patient verbalized understanding.  ? ?Follow-up: Return in about 3 months (around 04/11/2022).  ? ?Zelda W Fleming, FNP-BC ?[facility] ?[place], [place] ?[phone]   ?01/09/2022, 11:18 AM ?

## 2022-01-10 LAB — HEMOGLOBIN A1C
Est. average glucose Bld gHb Est-mCnc: 232 mg/dL
Hgb A1c MFr Bld: 9.7 % — ABNORMAL HIGH (ref 4.8–5.6)

## 2022-01-10 LAB — HIV ANTIBODY (ROUTINE TESTING W REFLEX): HIV Screen 4th Generation wRfx: NONREACTIVE

## 2022-01-12 ENCOUNTER — Other Ambulatory Visit: Payer: Self-pay

## 2022-01-12 LAB — CERVICOVAGINAL ANCILLARY ONLY
Bacterial Vaginitis (gardnerella): NEGATIVE
Candida Glabrata: NEGATIVE
Candida Vaginitis: NEGATIVE
Chlamydia: NEGATIVE
Comment: NEGATIVE
Comment: NEGATIVE
Comment: NEGATIVE
Comment: NEGATIVE
Comment: NEGATIVE
Comment: NORMAL
Neisseria Gonorrhea: NEGATIVE
Trichomonas: NEGATIVE

## 2022-01-13 LAB — CYTOLOGY - PAP
Comment: NEGATIVE
Diagnosis: NEGATIVE
High risk HPV: NEGATIVE

## 2022-01-17 ENCOUNTER — Other Ambulatory Visit: Payer: Self-pay | Admitting: Nurse Practitioner

## 2022-01-17 DIAGNOSIS — E1165 Type 2 diabetes mellitus with hyperglycemia: Secondary | ICD-10-CM

## 2022-01-17 MED ORDER — INSULIN GLARGINE-YFGN 100 UNIT/ML ~~LOC~~ SOLN: 45.0000 [IU] | Freq: Every day | SUBCUTANEOUS | 0 refills | Status: AC

## 2022-01-17 MED ORDER — "INSULIN SYRINGES 31G X 5/16"" 0.5 ML MISC": 6 refills | Status: AC

## 2022-01-19 ENCOUNTER — Other Ambulatory Visit: Payer: Self-pay

## 2022-04-17 ENCOUNTER — Encounter: Payer: Self-pay | Admitting: Nurse Practitioner

## 2022-04-17 ENCOUNTER — Ambulatory Visit: Payer: PRIVATE HEALTH INSURANCE | Attending: Nurse Practitioner | Admitting: Nurse Practitioner

## 2022-04-17 VITALS — BP 131/82 | HR 75 | Temp 98.1°F | Ht 73.0 in | Wt 199.6 lb

## 2022-04-17 DIAGNOSIS — E1165 Type 2 diabetes mellitus with hyperglycemia: Secondary | ICD-10-CM | POA: Diagnosis not present

## 2022-04-17 DIAGNOSIS — Z1211 Encounter for screening for malignant neoplasm of colon: Secondary | ICD-10-CM

## 2022-04-17 DIAGNOSIS — Z1231 Encounter for screening mammogram for malignant neoplasm of breast: Secondary | ICD-10-CM | POA: Diagnosis not present

## 2022-04-17 DIAGNOSIS — I10 Essential (primary) hypertension: Secondary | ICD-10-CM

## 2022-04-17 LAB — POCT GLYCOSYLATED HEMOGLOBIN (HGB A1C): HbA1c, POC (controlled diabetic range): 9.4 % — AB (ref 0.0–7.0)

## 2022-04-17 MED ORDER — OZEMPIC (0.25 OR 0.5 MG/DOSE) 2 MG/3ML ~~LOC~~ SOPN: 0.5000 mg | PEN_INJECTOR | SUBCUTANEOUS | 6 refills | Status: DC

## 2022-04-17 MED ORDER — DAPAGLIFLOZIN PROPANEDIOL 10 MG PO TABS: 10.0000 mg | ORAL_TABLET | Freq: Every day | ORAL | 1 refills | Status: DC

## 2022-04-17 NOTE — Progress Notes (Signed)
Assessment & Plan:  Diana Fleming was seen today for diabetes.  Diagnoses and all orders for this visit:  Type 2 diabetes mellitus with hyperglycemia, without long-term current use of insulin (HCC) -     POCT glycosylated hemoglobin (Hb A1C) -     Ambulatory referral to Ophthalmology -     CMP14+EGFR IF OZEMPIC NOT COVERED. TRY FARXIGA. DO NOT TAKE BOTH -     Semaglutide,0.25 or 0.5MG/DOS, (OZEMPIC, 0.25 OR 0.5 MG/DOSE,) 2 MG/3ML SOPN; Inject 0.5 mg into the skin once a week. -     dapagliflozin propanediol (FARXIGA) 10 MG TABS tablet; Take 1 tablet (10 mg total) by mouth daily before breakfast.  Essential hypertension -     CMP14+EGFR  Colon cancer screening -     Fecal occult blood, imunochemical([facility])  Breast cancer screening by mammogram -     MM DIAG BREAST TOMO BILATERAL; Future    Patient has been counseled on age-appropriate routine health concerns for screening and prevention. These are reviewed and up-to-date. Referrals have been placed accordingly. Immunizations are up-to-date or declined.    Subjective:   Chief Complaint  Patient presents with   Diabetes   Diabetes Pertinent negatives for hypoglycemia include no dizziness, headaches or seizures. Pertinent negatives for diabetes include no blurred vision, no chest pain and no weight loss.   Diana Fleming 56 y.o. female presents to office today for follow up to DM and HTN.  She has a history of HTN, HPL, DM 2, Hypothyroidism (Followed by ENDO)  HTN Currently taking losartan, HCTZ, and amlodipine as prescribed. Blood pressure is well controlled.  BP Readings from Last 3 Encounters:  04/17/22 131/82  01/09/22 134/89  11/06/21 130/82     DM 2 Not controlled. Works at [employer] is not optimal and she also endorses stress eating and cravings. Will send ozempic to pharmacy. If not covered will start her on farxiga. If farxiga not covered we will change her dose of semglee. She is also taking  metformin 1000 mg BID. LDL at goal with high intensity statin.  Lab Results  Component Value Date   HGBA1C 9.4 (A) 04/17/2022    Lab Results  Component Value Date   HGBA1C 9.7 (H) 01/09/2022    Lab Results  Component Value Date   LDLCALC 64 12/17/2021     Review of Systems  Constitutional:  Negative for fever, malaise/fatigue and weight loss.  HENT: Negative.  Negative for nosebleeds.   Eyes: Negative.  Negative for blurred vision, double vision and photophobia.  Respiratory: Negative.  Negative for cough and shortness of breath.   Cardiovascular: Negative.  Negative for chest pain, palpitations and leg swelling.  Gastrointestinal: Negative.  Negative for heartburn, nausea and vomiting.  Musculoskeletal: Negative.  Negative for myalgias.  Neurological: Negative.  Negative for dizziness, focal weakness, seizures and headaches.  Psychiatric/Behavioral: Negative.  Negative for suicidal ideas.     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   Outpatient Medications Prior to Visit  Medication Sig Dispense Refill   amLODipine (NORVASC) 10 MG tablet Take 1 tablet (10 mg total) by mouth daily. 90 tablet 1   atorvastatin (LIPITOR) 80 MG tablet Take 1 tablet (80 mg total) by mouth daily at 6pm 90 tablet 3   Blood Glucose Monitoring Suppl (FREESTYLE LITE) w/Device KIT Use as  instructed to test blood sugar 2 times daily, in the morning and the evening 1 kit 0   insulin glargine-yfgn (SEMGLEE, YFGN,) 100 UNIT/ML injection Inject 0.45 mLs (45 Units total) into the skin daily. 30 mL 0   Insulin Syringe-Needle U-100 (INSULIN SYRINGES) 31G X 5/16" 0.5 ML MISC Use as instructed. Inject into the skin once nightly. 100 each 6   levothyroxine (SYNTHROID) 125 MCG tablet Take 1 tablet (125 mcg total) by mouth daily. 90 tablet 1   losartan-hydrochlorothiazide (HYZAAR) 100-25 MG tablet TAKE 1 TABLET BY MOUTH DAILY. 90 tablet 1   metFORMIN (GLUCOPHAGE) 1000 MG tablet Take 1 tablet (1,000 mg total) by mouth 2 (two) times daily with a meal. 180 tablet 3   OneTouch Delica Lancets 63Z MISC Use as instructed to check blood sugar up to 3 times daily. E11.65 100 each 11   Lancets (FREESTYLE) lancets Use as instructed (Patient not taking: Reported on 04/17/2022) 100 each 12   diclofenac (VOLTAREN) 75 MG EC tablet Take 1 tablet (75 mg total) by mouth 2 (two) times daily. (Patient not taking: Reported on 04/17/2022) 50 tablet 2   No facility-administered medications prior to visit.    No Known Allergies     Objective:    BP 131/82   Pulse 75   Temp 98.1 F (36.7 C) (Oral)   Ht 6' 1"  (1.854 m)   Wt 199 lb 9.6 oz (90.5 kg)   LMP 12/05/2012   SpO2 98%   BMI 26.33 kg/m  Wt Readings from Last 3 Encounters:  04/17/22 199 lb 9.6 oz (90.5 kg)  01/09/22 191 lb 12.8 oz (87 kg)  11/06/21 195 lb (88.5 kg)    Physical Exam Vitals and nursing note reviewed.  Constitutional:      Appearance: She is well-developed.  HENT:     Head: Normocephalic and atraumatic.  Cardiovascular:     Rate and Rhythm: Normal rate and regular rhythm.     Heart sounds: Normal heart sounds. No murmur heard.    No friction rub. No gallop.  Pulmonary:     Effort: Pulmonary effort is normal. No tachypnea or respiratory distress.     Breath sounds: Normal breath sounds. No decreased breath sounds, wheezing,  rhonchi or rales.  Chest:     Chest wall: No tenderness.  Abdominal:     General: Bowel sounds are normal.     Palpations: Abdomen is soft.  Musculoskeletal:        General: Normal range of motion.     Cervical back: Normal range of motion.  Skin:    General: Skin is warm and dry.  Neurological:     Mental Status: She is alert and oriented to person, place, and time.     Coordination: Coordination normal.  Psychiatric:        Behavior: Behavior normal. Behavior is cooperative.        Thought Content: Thought content normal.  Judgment: Judgment normal.          Patient has been counseled extensively about nutrition and exercise as well as the importance of adherence with medications and regular follow-up. The patient was given clear instructions to go to ER or return to medical center if symptoms don't improve, worsen or new problems develop. The patient verbalized understanding.   Follow-up: Return in about 3 months (around 07/18/2022) for HTN/DM.   Gildardo Pounds, FNP-BC [facility] and [facility], [place]   04/17/2022, 10:13 AM

## 2022-04-18 LAB — CMP14+EGFR
ALT: 24 IU/L (ref 0–32)
AST: 14 IU/L (ref 0–40)
Albumin/Globulin Ratio: 1.5 (ref 1.2–2.2)
Albumin: 4.6 g/dL (ref 3.8–4.9)
Alkaline Phosphatase: 118 IU/L (ref 44–121)
BUN/Creatinine Ratio: 15 (ref 9–23)
BUN: 12 mg/dL (ref 6–24)
Bilirubin Total: 0.6 mg/dL (ref 0.0–1.2)
CO2: 25 mmol/L (ref 20–29)
Calcium: 9.9 mg/dL (ref 8.7–10.2)
Chloride: 97 mmol/L (ref 96–106)
Creatinine, Ser: 0.78 mg/dL (ref 0.57–1.00)
Globulin, Total: 3 g/dL (ref 1.5–4.5)
Glucose: 220 mg/dL — ABNORMAL HIGH (ref 70–99)
Potassium: 3.7 mmol/L (ref 3.5–5.2)
Sodium: 138 mmol/L (ref 134–144)
Total Protein: 7.6 g/dL (ref 6.0–8.5)
eGFR: 90 mL/min/{1.73_m2} (ref >59)

## 2022-06-01 ENCOUNTER — Other Ambulatory Visit: Payer: Self-pay | Admitting: Endocrinology

## 2022-06-01 DIAGNOSIS — E039 Hypothyroidism, unspecified: Secondary | ICD-10-CM

## 2022-06-01 DIAGNOSIS — E1165 Type 2 diabetes mellitus with hyperglycemia: Secondary | ICD-10-CM

## 2022-06-03 ENCOUNTER — Other Ambulatory Visit (INDEPENDENT_AMBULATORY_CARE_PROVIDER_SITE_OTHER): Payer: PRIVATE HEALTH INSURANCE

## 2022-06-03 ENCOUNTER — Other Ambulatory Visit: Payer: PRIVATE HEALTH INSURANCE

## 2022-06-03 DIAGNOSIS — E1165 Type 2 diabetes mellitus with hyperglycemia: Secondary | ICD-10-CM

## 2022-06-03 DIAGNOSIS — E039 Hypothyroidism, unspecified: Secondary | ICD-10-CM

## 2022-06-03 LAB — BASIC METABOLIC PANEL
BUN: 22 mg/dL (ref 6–23)
CO2: 30 mEq/L (ref 19–32)
Calcium: 10.1 mg/dL (ref 8.4–10.5)
Chloride: 97 mEq/L (ref 96–112)
Creatinine, Ser: 0.84 mg/dL (ref 0.40–1.20)
GFR: 78.94 mL/min (ref >60.00)
Glucose, Bld: 224 mg/dL — ABNORMAL HIGH (ref 70–99)
Potassium: 3.4 mEq/L — ABNORMAL LOW (ref 3.5–5.1)
Sodium: 137 mEq/L (ref 135–145)

## 2022-06-03 LAB — TSH: TSH: 4.87 u[IU]/mL (ref 0.35–5.50)

## 2022-06-03 LAB — HEMOGLOBIN A1C: Hgb A1c MFr Bld: 10.6 % — ABNORMAL HIGH (ref 4.6–6.5)

## 2022-06-03 LAB — MICROALBUMIN / CREATININE URINE RATIO
Creatinine,U: 109.7 mg/dL
Microalb Creat Ratio: 1.8 mg/g (ref 0.0–30.0)
Microalb, Ur: 2 mg/dL — ABNORMAL HIGH (ref 0.0–1.9)

## 2022-06-05 LAB — FECAL OCCULT BLOOD, IMMUNOCHEMICAL: Fecal Occult Bld: NEGATIVE

## 2022-06-08 NOTE — Progress Notes (Deleted)
Patient ID: Diana Fleming, female   DOB: 10/27/1965, 56 y.o.   MRN: [id number]  Reason for Appointment:  Hypothyroidism, followup visit    History of Present Illness:   HYPOTHYROIDISM was first diagnosed in 2010   The patient has been treated with The last visit was   Patient has no complaints of unusual fatigue, cold sensitivity, dry skin, unusual weight gain or hair loss.            The patient is taking the thyroid supplement very regularly in the morning before breakfast. Not taking any calcium or iron supplements with the thyroid supplement.    On the last visit the dose was  Lab Results  Component Value Date   TSH 4.87 06/03/2022   TSH 0.50 11/06/2021   TSH 0.59 05/09/2021   FREET4 1.35 11/06/2021   FREET4 1.04 05/09/2021   FREET4 1.55 12/25/2020     Lab on 06/03/2022  Component Date Value Ref Range Status   TSH 06/03/2022 4.87  0.35 - 5.50 uIU/mL Final   Microalb, Ur 06/03/2022 2.0 (H)  0.0 - 1.9 mg/dL Final   Creatinine,U 06/03/2022 109.7  mg/dL Final   Microalb Creat Ratio 06/03/2022 1.8  0.0 - 30.0 mg/g Final   Sodium 06/03/2022 137  135 - 145 mEq/L Final   Potassium 06/03/2022 3.4 (L)  3.5 - 5.1 mEq/L Final   Chloride 06/03/2022 97  96 - 112 mEq/L Final   CO2 06/03/2022 30  19 - 32 mEq/L Final   Glucose, Bld 06/03/2022 224 (H)  70 - 99 mg/dL Final   BUN 06/03/2022 22  6 - 23 mg/dL Final   Creatinine, Ser 06/03/2022 0.84  0.40 - 1.20 mg/dL Final   GFR 06/03/2022 78.94  >60.00 mL/min Final   Calculated using the CKD-EPI Creatinine Equation (2021)   Calcium 06/03/2022 10.1  8.4 - 10.5 mg/dL Final   Hgb A1c MFr Bld 06/03/2022 10.6 (H)  4.6 - 6.5 % Final   Glycemic Control Guidelines for People with Diabetes:Non Diabetic:  <6%Goal of Therapy: <7%Additional Action Suggested:  >8%     Allergies as of 06/09/2022   No Known Allergies      Medication List        Accurate as of June 08, 2022  9:06 PM. If you have any questions, ask your nurse or doctor.           amLODipine 10 MG tablet Commonly known as: NORVASC Take 1 tablet (10 mg total) by mouth daily.   atorvastatin 80 MG tablet Commonly known as: LIPITOR Take 1 tablet (80 mg total) by mouth daily at 6pm   dapagliflozin propanediol 10 MG Tabs tablet Commonly known as: Farxiga Take 1 tablet (10 mg total) by mouth daily before breakfast.   FreeStyle Lite w/Device Kit Use as instructed to test blood sugar 2 times daily, in the morning and the evening   Insulin Syringes 31G X 5/16" 0.5 ML Misc Use as instructed. Inject into the skin once nightly.   levothyroxine 125 MCG tablet Commonly known as: SYNTHROID Take 1 tablet (125 mcg total) by mouth daily.   losartan-hydrochlorothiazide 100-25 MG tablet Commonly known as: HYZAAR TAKE 1 TABLET BY MOUTH DAILY.   metFORMIN 1000 MG tablet Commonly known as: GLUCOPHAGE Take 1 tablet (1,000 mg total) by mouth 2 (two) times daily with a meal.   OneTouch Delica Lancets 03K Misc Use as instructed to check blood sugar up to 3 times daily. E11.65   freestyle lancets Use as  instructed   Ozempic (0.25 or 0.5 MG/DOSE) 2 MG/3ML Sopn Generic drug: Semaglutide(0.25 or 0.5MG/DOS) Inject 0.5 mg into the skin once a week.        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 She has never used  smokeless tobacco. She reports that she does not drink alcohol and does not use drugs.  Review of Systems   Lab Results  Component Value Date   HGBA1C 10.6 (H) 06/03/2022   HGBA1C 9.4 (A) 04/17/2022   HGBA1C 9.7 (H) 01/09/2022   Lab Results  Component Value Date   MICROALBUR 2.0 (H) 06/03/2022   LDLCALC 64 12/17/2021   CREATININE 0.84 06/03/2022     Examination:   LMP 12/05/2012   GENERAL APPEARANCE: Alert, no puffiness of the face or eyes  NECK: Thyroid is not palpable           NEUROLOGIC EXAM:  biceps reflexes show normal relaxation Skin: Not unusual dry    Assessment   Hypothyroidism    Treatment:   Continue same dosage before breakfast daily.  Avoid taking any calcium or iron supplements with the thyroid supplement.    Elayne Snare 06/08/2022, 9:06 PM   Note: This office note was prepared with Dragon voice recognition system technology. Any transcriptional errors that result from this process are unintentional.

## 2022-06-09 ENCOUNTER — Ambulatory Visit: Payer: PRIVATE HEALTH INSURANCE | Admitting: Endocrinology

## 2022-07-20 ENCOUNTER — Ambulatory Visit: Payer: PRIVATE HEALTH INSURANCE | Attending: Nurse Practitioner | Admitting: Nurse Practitioner

## 2022-07-20 ENCOUNTER — Encounter: Payer: Self-pay | Admitting: Nurse Practitioner

## 2022-07-20 VITALS — BP 134/84 | HR 85 | Temp 98.0°F | Ht 73.0 in | Wt 198.6 lb

## 2022-07-20 DIAGNOSIS — Z23 Encounter for immunization: Secondary | ICD-10-CM | POA: Diagnosis not present

## 2022-07-20 DIAGNOSIS — I10 Essential (primary) hypertension: Secondary | ICD-10-CM

## 2022-07-20 DIAGNOSIS — E1165 Type 2 diabetes mellitus with hyperglycemia: Secondary | ICD-10-CM | POA: Diagnosis not present

## 2022-07-20 MED ORDER — INSULIN GLARGINE-YFGN 100 UNIT/ML ~~LOC~~ SOLN: 45.0000 [IU] | Freq: Every day | SUBCUTANEOUS | 11 refills | Status: DC

## 2022-07-20 MED ORDER — OZEMPIC (0.25 OR 0.5 MG/DOSE) 2 MG/3ML ~~LOC~~ SOPN: 0.5000 mg | PEN_INJECTOR | SUBCUTANEOUS | 6 refills | Status: DC

## 2022-07-20 MED ORDER — AMLODIPINE BESYLATE 10 MG PO TABS: 10.0000 mg | ORAL_TABLET | Freq: Every day | ORAL | 1 refills | Status: DC

## 2022-07-20 MED ORDER — DAPAGLIFLOZIN PROPANEDIOL 10 MG PO TABS: 10.0000 mg | ORAL_TABLET | Freq: Every day | ORAL | 1 refills | Status: DC

## 2022-07-20 MED ORDER — GLUCOSE BLOOD VI STRP: ORAL_STRIP | 12 refills | Status: DC

## 2022-07-20 NOTE — Progress Notes (Signed)
Assessment & Plan:  Diana Fleming was seen today for hypertension.  Diagnoses and all orders for this visit:  Primary hypertension -     amLODipine (NORVASC) 10 MG tablet; Take 1 tablet (10 mg total) by mouth daily. -     Basic metabolic panel Continue all antihypertensives as prescribed.  Reminded to bring in blood pressure log for follow  up appointment.  RECOMMENDATIONS: DASH/Mediterranean Diets are healthier choices for HTN.    Type 2 diabetes mellitus with hyperglycemia, without long-term current use of insulin (HCC) She was instructed to start ozempic and continue with all other medication as prescribed.  -     POCT glycosylated hemoglobin (Hb A1C) -     insulin glargine-yfgn (SEMGLEE, YFGN,) 100 UNIT/ML injection; Inject 0.45 mLs (45 Units total) into the skin daily. -     glucose blood test strip; Use as instructed. Check blood glucose level by fingerstick three times per day. -     dapagliflozin propanediol (FARXIGA) 10 MG TABS tablet; Take 1 tablet (10 mg total) by mouth daily before breakfast. -     Semaglutide,0.25 or 0.5MG/DOS, (OZEMPIC, 0.25 OR 0.5 MG/DOSE,) 2 MG/3ML SOPN; Inject 0.5 mg into the skin once a week. -     Basic metabolic panel Continue blood sugar control as discussed in office today, low carbohydrate diet, and regular physical exercise as tolerated, 150 minutes per week (30 min each day, 5 days per week, or 50 min 3 days per week). Keep blood sugar logs with fasting goal of 90-130 mg/dl, post prandial (after you eat) less than 180.  For Hypoglycemia: BS <60 and Hyperglycemia BS >400; contact the clinic ASAP. Annual eye exams and foot exams are recommended.  Need for immunization against influenza -     Flu Vaccine QUAD 60moIM (Fluarix, Fluzone & Alfiuria Quad PF)    Patient has been counseled on age-appropriate routine health concerns for screening and prevention. These are reviewed and up-to-date. Referrals have been placed accordingly. Immunizations are  up-to-date or declined.    Subjective:   Chief Complaint  Patient presents with   Hypertension   HPI Diana [id number].o. female presents to office today for follow up to HTN  She has a history of HTN, HPL, DM 2, Hypothyroidism (Followed by ENDO)    HTN Blood pressure is controlled with losartan, HCTZ and amlodipine which she takes as prescribed.  BP Readings from Last 3 Encounters:  07/20/22 134/84  04/17/22 131/82  01/09/22 134/89    DM 2 Diabetes is poorly controlled. She has not been taking ozempic and states she was not aware she was supposed to be taking it. She has only been administering semglee 45units daily, taking farxiga 10 mg and metformin 1000 mg BID. We may need to add humalog based on next A1c if no improvement with ozempic.  Lab Results  Component Value Date   HGBA1C 10.6 (H) 06/03/2022   LDL at goal.  Lab Results  Component Value Date   LDLCALC 64 12/17/2021    Review of Systems  Constitutional:  Negative for fever, malaise/fatigue and weight loss.  HENT: Negative.  Negative for nosebleeds.   Eyes: Negative.  Negative for blurred vision, double vision and photophobia.  Respiratory: Negative.  Negative for cough and shortness of breath.   Cardiovascular: Negative.  Negative for chest pain, palpitations and leg swelling.  Gastrointestinal: Negative.  Negative for heartburn, nausea and vomiting.  Musculoskeletal: Negative.  Negative for myalgias.  Neurological: Negative.  Negative for  dizziness, focal weakness, seizures and headaches.  Psychiatric/Behavioral: Negative.  Negative for suicidal ideas.     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   Outpatient Medications Prior to Visit  Medication Sig Dispense Refill   atorvastatin (LIPITOR) 80 MG tablet Take 1 tablet (80 mg total) by mouth daily at 6pm 90 tablet 3   Blood Glucose Monitoring Suppl (FREESTYLE LITE) w/Device KIT Use as instructed to test blood sugar 2 times daily, in the morning and the evening 1 kit 0   Insulin Syringe-Needle U-100 (INSULIN SYRINGES) 31G X 5/16" 0.5 ML MISC Use as instructed. Inject into the skin once nightly. 100 each 6   Lancets (FREESTYLE) lancets Use as instructed 100 each 12   levothyroxine (SYNTHROID) 125 MCG tablet Take 1 tablet (125 mcg total) by mouth daily. 90 tablet 1   losartan-hydrochlorothiazide (HYZAAR) 100-25 MG tablet TAKE 1 TABLET BY MOUTH DAILY. 90 tablet 1   metFORMIN (GLUCOPHAGE) 1000 MG tablet Take 1 tablet (1,000 mg total) by mouth 2 (two) times daily with a meal. 180 tablet 3   OneTouch Delica Lancets 49S MISC Use as instructed to check blood sugar up to 3 times daily. E11.65 100 each 11   amLODipine (NORVASC) 10 MG tablet Take 1 tablet (10 mg total) by mouth daily. 90 tablet 1   dapagliflozin propanediol (FARXIGA) 10 MG TABS tablet Take 1 tablet (10 mg total) by mouth daily before breakfast. (Patient not taking: Reported on 07/20/2022) 90 tablet 1   Semaglutide,0.25 or 0.5MG/DOS, (OZEMPIC, 0.25 OR 0.5 MG/DOSE,) 2 MG/3ML SOPN Inject 0.5 mg into the skin once a week. (Patient not taking: Reported on 07/20/2022) 3 mL 6   No facility-administered medications prior to visit.    No Known Allergies     Objective:    BP 134/84   Pulse 85   Temp 98 F (36.7 C) (Temporal)   Ht _0  (1.854 m)   Wt 198 lb 9.6 oz (90.1 kg)    LMP 12/05/2012   SpO2 98%   BMI 26.20 kg/m  Wt Readings from Last 3 Encounters:  07/20/22 198 lb 9.6 oz (90.1 kg)  04/17/22 199 lb 9.6 oz (90.5 kg)  01/09/22 191 lb 12.8 oz (87 kg)    Physical Exam Vitals and nursing note reviewed.  Constitutional:      Appearance: She is well-developed.  HENT:     Head: Normocephalic and atraumatic.  Cardiovascular:     Rate and Rhythm: Normal rate and regular rhythm.     Heart sounds: Normal heart sounds. No murmur heard.    No friction rub. No gallop.  Pulmonary:     Effort: Pulmonary effort is normal. No tachypnea or respiratory distress.     Breath sounds: Normal breath sounds. No decreased breath  sounds, wheezing, rhonchi or rales.  Chest:     Chest wall: No tenderness.  Abdominal:     General: Bowel sounds are normal.     Palpations: Abdomen is soft.  Musculoskeletal:        General: Normal range of motion.     Cervical back: Normal range of motion.  Skin:    General: Skin is warm and dry.  Neurological:     Mental Status: She is alert and oriented to person, place, and time.     Coordination: Coordination normal.  Psychiatric:        Behavior: Behavior normal. Behavior is cooperative.        Thought Content: Thought content normal.        Judgment: Judgment normal.          Patient has been counseled extensively about nutrition and exercise as well as the importance of adherence with medications and regular follow-up. The patient was given clear instructions to go to ER or return to medical center if symptoms don't improve, worsen or new problems develop. The patient verbalized understanding.   Follow-up: Return in about 3 months (around 10/20/2022).   Gildardo Pounds, FNP-BC [facility], [place]   07/20/2022, 10:09 PM

## 2022-07-20 NOTE — Progress Notes (Signed)
No concerns. 

## 2022-07-21 LAB — BASIC METABOLIC PANEL
BUN/Creatinine Ratio: 10 (ref 9–23)
BUN: 9 mg/dL (ref 6–24)
CO2: 20 mmol/L (ref 20–29)
Calcium: 9.7 mg/dL (ref 8.7–10.2)
Chloride: 99 mmol/L (ref 96–106)
Creatinine, Ser: 0.91 mg/dL (ref 0.57–1.00)
Glucose: 308 mg/dL — ABNORMAL HIGH (ref 70–99)
Potassium: 3.9 mmol/L (ref 3.5–5.2)
Sodium: 139 mmol/L (ref 134–144)
eGFR: 75 mL/min/{1.73_m2} (ref >59)

## 2022-08-07 ENCOUNTER — Other Ambulatory Visit: Payer: Self-pay | Admitting: Nurse Practitioner

## 2022-08-07 DIAGNOSIS — E039 Hypothyroidism, unspecified: Secondary | ICD-10-CM

## 2022-08-07 NOTE — Telephone Encounter (Signed)
Requested Prescriptions  Pending Prescriptions Disp Refills   levothyroxine (SYNTHROID) 125 MCG tablet [Pharmacy Med Name: Levothyroxine Sodium 125 MCG Oral Tablet] 90 tablet 0    Sig: Take 1 tablet by mouth once daily     Endocrinology:  Hypothyroid Agents Passed - 08/07/2022  5:09 AM      Passed - TSH in normal range and within 360 days    TSH  Date Value Ref Range Status  06/03/2022 4.87 0.35 - 5.50 uIU/mL Final         Passed - Valid encounter within last 12 months    Recent Outpatient Visits           2 weeks ago Primary hypertension   [facility] [place], [place] W, NP   3 months ago Type 2 diabetes mellitus with hyperglycemia, without long-term current use of insulin [facility])   [place] [facility] [place], Shea Stakes, NP   7 months ago Encounter for Papanicolaou smear for cervical cancer screening   [facility] [place], [place] W, NP   9 months ago Type 2 diabetes mellitus with hyperglycemia, without long-term current use of insulin [facility])   [place] [facility] [place], [place] W, NP   1 year ago Type 2 diabetes mellitus with hyperglycemia, without long-term current use of insulin [facility])   [place] [facility] [place], Shea Stakes, NP       Future Appointments             In 2 months Claiborne Rigg, NP [employer] [facility]

## 2022-08-18 ENCOUNTER — Ambulatory Visit: Payer: PRIVATE HEALTH INSURANCE | Admitting: Internal Medicine

## 2022-08-20 ENCOUNTER — Encounter: Payer: Self-pay | Admitting: Internal Medicine

## 2022-08-20 ENCOUNTER — Ambulatory Visit (INDEPENDENT_AMBULATORY_CARE_PROVIDER_SITE_OTHER): Payer: No Typology Code available for payment source | Admitting: Internal Medicine

## 2022-08-20 VITALS — BP 118/66 | HR 79 | Ht 73.0 in | Wt 201.6 lb

## 2022-08-20 DIAGNOSIS — E039 Hypothyroidism, unspecified: Secondary | ICD-10-CM

## 2022-08-20 DIAGNOSIS — E1165 Type 2 diabetes mellitus with hyperglycemia: Secondary | ICD-10-CM | POA: Diagnosis not present

## 2022-08-20 LAB — HEMOGLOBIN A1C: Hgb A1c MFr Bld: 12 % — ABNORMAL HIGH (ref 4.6–6.5)

## 2022-08-20 MED ORDER — HUMALOG KWIKPEN 200 UNIT/ML ~~LOC~~ SOPN: 8.0000 [IU] | PEN_INJECTOR | Freq: Three times a day (TID) | SUBCUTANEOUS | 3 refills | Status: DC

## 2022-08-20 MED ORDER — INSULIN PEN NEEDLE 32G X 4 MM MISC: 3 refills | Status: DC

## 2022-08-20 NOTE — Progress Notes (Addendum)
Patient ID: Diana Fleming, female   DOB: 01/17/1966, 56 y.o.   MRN: [id number]  HPI  Diana Fleming is a 56 y.o.-year-old female, returning for follow up post ablative hypothyroidism.  She also has uncontrolled type 2 diabetes with PN.  She was previously seen by Dr. Ellison who was managing her hypothyroidism.  Pt. has been dx with hypothyroidism in 2010, after radioactive iodine ablation >> on Levothyroxine 125 mcg.  Pt takes the levothyroxine: - in am - fasting - at least 30 min from b'fast - no calcium - no iron - no multivitamins - no PPIs - not on Biotin  I reviewed pt's thyroid tests: Lab Results  Component Value Date   TSH 4.87 06/03/2022   TSH 0.50 11/06/2021   TSH 0.59 05/09/2021   TSH 0.017 (L) 02/05/2021   TSH 0.377 (L) 12/25/2020   TSH 0.045 (L) 09/18/2020   TSH 1.670 01/31/2020   TSH 3.200 03/20/2019   TSH 2.250 03/16/2018   TSH 21.281 (H) 01/31/2018   FREET4 1.35 11/06/2021   FREET4 1.04 05/09/2021   FREET4 1.55 12/25/2020   Antithyroid antibodies: No results found for: "THGAB" No components found for: "TPOAB"  Pt denies feeling nodules in neck, hoarseness, dysphagia/odynophagia.  She has + FH of thyroid disorders in: mother. No FH of thyroid cancer.  No h/o radiation tx to head or neck exc. RAI tx. No recent use of iodine supplements.  She also has uncontrolled type 2 diabetes:  Reviewed HbA1c: Lab Results  Component Value Date   HGBA1C 10.6 (H) 06/03/2022   HGBA1C 9.4 (A) 04/17/2022   HGBA1C 9.7 (H) 01/09/2022   HGBA1C 8.5 (A) 10/17/2021   HGBA1C 7.1 (A) 07/11/2021   HGBA1C 8.0 (H) 12/25/2020   HGBA1C 9.2 (A) 09/18/2020   HGBA1C 8.5 (A) 01/31/2020   HGBA1C CANCELED 12/28/2019   HGBA1C 11.3 (A) 07/17/2019   Pt is on a regimen of: - Metformin 1000 mg 2x a day, with meals >> 1x a day - Farxiga 10 mg in a.m. >> stopped b/c not covered - Ozempic 0.5 mg weekly >> stopped b/c not covered - Semglee 30 >> 45 units at bedtime,  increased  07/2022  Pt checks her sugars 2-3x a day and they are: - am: 153, 193-210 - 2h after b'fast: 208 - before lunch: n/c - 2h after lunch: 255-340 - before dinner: n/c - 2h after dinner: 248-393 - bedtime: n/c - nighttime: n/c Lowest sugar was 153. Highest sugar was 393.  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  - no CKD, last BUN/creatinine:  Lab Results  Component Value Date   BUN 9 07/20/2022   BUN 22 06/03/2022   CREATININE 0.91 07/20/2022   CREATININE 0.84 06/03/2022   - + HL; last set of lipids: Lab Results  Component Value Date   CHOL 127 12/17/2021   HDL 48 12/17/2021   LDLCALC 64 12/17/2021   TRIG 73 12/17/2021   CHOLHDL 2.6 12/17/2021  On Lipitor 80 mg daily  - last eye exam was in 2022. No DR reportedly.   - + numbness and tingling in her feet.  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      Hypertension    Hypothyroidism    Pure hypercholesterolemia 05/11/2019   Past Surgical History:  Procedure Laterality Date   DILATION AND CURETTAGE, DIAGNOSTIC / THERAPEUTIC  2002   Radioiodine ablation     TUBAL LIGATION     Social History   Socioeconomic History   Marital status: Married    Spouse name: Not on file   Number of children: Not on file   Years of education: Not on file   Highest education level: Not on file  Occupational History   Not on file  Tobacco Use   Smoking status: Never   Smokeless tobacco: Never  Vaping Use   Vaping Use: Never used  Substance and Sexual Activity   Alcohol use: Never   Drug use: Never   Sexual activity: Yes  Other Topics Concern   Not on file  Social History Narrative   Not on file   Social Determinants of Health   Financial  Resource Strain: Not on file  Food Insecurity: Not on file  Transportation Needs: Not on file  Physical Activity: Not on file  Stress: Not on file  Social Connections: Not on file  Intimate Partner Violence: Not on file   Current Outpatient Medications on File Prior to Visit  Medication Sig Dispense Refill   amLODipine (NORVASC) 10 MG tablet Take 1 tablet (10 mg total) by mouth daily. 90 tablet 1   atorvastatin (LIPITOR) 80 MG tablet Take 1 tablet (80 mg total) by mouth daily at 6pm 90 tablet 3   Blood Glucose Monitoring Suppl (FREESTYLE LITE) w/Device KIT Use as instructed to test blood sugar 2 times daily, in the morning and the evening 1 kit 0   dapagliflozin propanediol (FARXIGA) 10 MG TABS tablet Take 1 tablet (10 mg total) by mouth daily before breakfast. 90 tablet 1   glucose blood test strip Use as instructed. Check blood glucose level by fingerstick three times per day. 200 each 12   insulin glargine-yfgn (SEMGLEE, YFGN,) 100 UNIT/ML injection Inject 0.45 mLs (45 Units total) into the skin daily. 10 mL 11   Insulin Syringe-Needle U-100 (INSULIN SYRINGES) 31G X 5/16" 0.5 ML MISC Use as instructed. Inject into the skin once nightly. 100 each 6   Lancets (FREESTYLE) lancets Use as instructed 100 each 12   levothyroxine (SYNTHROID) 125 MCG tablet Take 1 tablet by mouth once daily 90 tablet 0   losartan-hydrochlorothiazide (HYZAAR) 100-25 MG tablet TAKE 1 TABLET BY MOUTH DAILY. 90 tablet 1   metFORMIN (GLUCOPHAGE) 1000 MG tablet Take 1 tablet (1,000 mg total) by mouth 2 (two) times daily with a meal. 180 tablet 3   OneTouch Delica Lancets 41U MISC Use as instructed to check blood sugar up to 3 times daily. E11.65 100 each 11   Semaglutide,0.25 or 0.5MG/DOS, (OZEMPIC, 0.25 OR 0.5 MG/DOSE,) 2 MG/3ML SOPN Inject 0.5 mg into the skin once a week. 3 mL 6   No current facility-administered medications on file prior to visit.   No Known Allergies Family History  Problem Relation Age of Onset    Cancer Mother    Thyroid disease Mother    Heart attack Maternal Grandfather    Heart attack Maternal Grandmother    PE: BP 118/66 (BP Location: Left Arm, Patient Position: Sitting, Cuff Size: Normal)   Pulse 79   Ht 6' 1" (1.854 m)   Wt 201 lb 9.6 oz (91.4 kg)   LMP 12/05/2012   SpO2 99%   BMI 26.60 kg/m  Wt Readings  from Last 3 Encounters:  08/20/22 201 lb 9.6 oz (91.4 kg)  07/20/22 198 lb 9.6 oz (90.1 kg)  04/17/22 199 lb 9.6 oz (90.5 kg)   Constitutional: Slightly no overweight, in NAD Eyes:  EOMI, no exophthalmos ENT: no neck masses, no cervical lymphadenopathy Cardiovascular: RRR, No MRG Respiratory: CTA B Musculoskeletal: no deformities Skin:no rashes Neurological: no tremor with outstretched hands  ASSESSMENT: 1.  Postablation hypothyroidism  2.  Type 2 diabetes, uncontrolled, with complications - PN  PLAN:  1. Patient with long-standing hypothyroidism, on levothyroxine therapy. - latest thyroid labs reviewed with pt. >> normal: Lab Results  Component Value Date   TSH 4.87 06/03/2022  - she continues on LT4 125 mcg daily - pt feels good on this dose. - we discussed about taking the thyroid hormone every day, with water, >30 minutes before breakfast, separated by >4 hours from acid reflux medications, calcium, iron, multivitamins. Pt. is taking it correctly. - will check thyroid tests today: TSH and fT4 - If labs are abnormal, she will need to return for repeat TFTs in 1.5 months  2.  Uncontrolled type 2 diabetes -prev. managed by PCP -she had a recent HbA1c of 10.6%, increased -She was supposed to be on a regimen with: Metformin 1000 mg twice a day, Farxiga 10 mg in a.m. Semglee 45 mg daily, Ozempic 0.5 mg weekly. -However, at today's visit, she tells me that she is only taking metformin once a day, and she is off Ozempic and Farxiga due to the fact that she does not have insurance. -Reviewing her blood sugars at home, per downloaded report, these are  very high, mostly in the 200s and 300s. -For now, I advised her to continue the same dose of Semglee, but we discussed that we need to add mealtime insulin.  I advised her to start with 8 to 12 units of insulin (will try Humalog) but increase the dose as needed depending on the blood sugars.  I advised her how to change the dose based on the size and consistency of her meals.  If Humalog is not affordable, we discussed about using over-the-counter ReliOn insulin from [employer].  I explained that this comes in a vial and she will need to drive with a syringe and injected 30 minutes before each meal.  For Humalog, if she is able to obtain this, this needs to be injected 15 minutes before each meal.  At next visit, hopefully after she gets insurance, we may try again to start Ozempic and Farxiga.  Patient assistance is another option.  However, for now, she is glucotoxicity and definitely needs insulin. -I advised her to: Patient Instructions  Please continue Levothyroxine 125 mcg daily.  Take the thyroid hormone every day, with water, at least 30 minutes before breakfast, separated by at least 4 hours from: - acid reflux medications - calcium - iron - multivitamins  Please stop at the lab.  Please increase: - Metformin 1000 mg 2x a day with meals  Continue: - Semglee 45 units at night  Start: - Humalog 8-12 units 15 min before the 3 main meals (If we need to use the ReliOn insulin R, inject same doses 30 min before a meal)  NO SWEET DRINKS!  Please return in 2 months with your sugar log.   - we we will repeat her HbA1c today - advised to check sugars at different times of the day - 3-4x a day, rotating check times -I am hoping that after she gets insurance   to be able to use a CGM for her - advised for yearly eye exams >> she is not UTD - return to clinic in 2-3 months   - Total time spent for the visit: 40 min, in precharting, postcharting , reviewing Dr. Cordelia Pen last note, obtaining  medical information from the chart and from the pt, reviewing her  previous labs, evaluations, and treatments, reviewing her symptoms, counseling her about her diabetes and thyroid condition (please see the discussed topics above), and developing a plan to further investigate and treat them.  Component     Latest Ref Rng 08/20/2022  Hemoglobin A1C     4.6 - 6.5 % 12.0 (H)   Very high HbA1c -continue with the plan to add mealtime insulin, as above.  Component     Latest Ref Rng 08/21/2022  TSH     0.35 - 5.50 uIU/mL 7.17 (H)   TSH is slightly elevated.  Will increase her levothyroxine dose to 137 mcg daily and recheck the tests at next visit - in 2 mo.  Philemon Kingdom, MD PhD [facility] Endocrinology

## 2022-08-20 NOTE — Patient Instructions (Addendum)
Please continue Levothyroxine 125 mcg daily.  Take the thyroid hormone every day, with water, at least 30 minutes before breakfast, separated by at least 4 hours from: - acid reflux medications - calcium - iron - multivitamins  Please stop at the lab.  Please increase: - Metformin 1000 mg 2x a day with meals  Continue: - Semglee 45 units at night  Start: - Humalog 8-12 units 15 min before the 3 main meals (If we need to use the ReliOn insulin R, inject same doses 30 min before a meal)  NO SWEET DRINKS!  Please return in 2 months with your sugar log.   PATIENT INSTRUCTIONS FOR TYPE 2 DIABETES:  **Please join MyChart!** - see attached instructions about how to join if you have not done so already.  DIET AND EXERCISE Diet and exercise is an important part of diabetic treatment.  We recommended aerobic exercise in the form of brisk walking (working between 40-60% of maximal aerobic capacity, similar to brisk walking) for 150 minutes per week (such as 30 minutes five days per week) along with 3 times per week performing 'resistance' training (using various gauge rubber tubes with handles) 5-10 exercises involving the major muscle groups (upper body, lower body and core) performing 10-15 repetitions (or near fatigue) each exercise. Start at half the above goal but build slowly to reach the above goals. If limited by weight, joint pain, or disability, we recommend daily walking in a swimming pool with water up to waist to reduce pressure from joints while allow for adequate exercise.    BLOOD GLUCOSES Monitoring your blood glucoses is important for continued management of your diabetes. Please check your blood glucoses 2-4 times a day: fasting, before meals and at bedtime (you can rotate these measurements - e.g. one day check before the 3 meals, the next day check before 2 of the meals and before bedtime, etc.).   HYPOGLYCEMIA (low blood sugar) Hypoglycemia is usually a reaction to not  eating, exercising, or taking too much insulin/ other diabetes drugs.  Symptoms include tremors, sweating, hunger, confusion, headache, etc. Treat IMMEDIATELY with 15 grams of Carbs: 4 glucose tablets  cup regular juice/soda 2 tablespoons raisins 4 teaspoons sugar 1 tablespoon honey Recheck blood glucose in 15 mins and repeat above if still symptomatic/blood glucose <100.  RECOMMENDATIONS TO REDUCE YOUR RISK OF DIABETIC COMPLICATIONS: * Take your prescribed MEDICATION(S) * Follow a DIABETIC diet: Complex carbs, fiber rich foods, (monounsaturated and polyunsaturated) fats * AVOID saturated/trans fats, high fat foods, >2,300 mg salt per day. * EXERCISE at least 5 times a week for 30 minutes or preferably daily.  * DO NOT SMOKE OR DRINK more than 1 drink a day. * Check your FEET every day. Do not wear tightfitting shoes. Contact us if you develop an ulcer * See your EYE doctor once a year or more if needed * Get a FLU shot once a year * Get a PNEUMONIA vaccine once before and once after age 61 years  GOALS:  * Your Hemoglobin A1c of <7%  * fasting sugars need to be 80-130 * after meals sugars need to be <180 (2h after you start eating) * Your Systolic BP should be 130 or lower  * Your Diastolic BP should be 80 or lower  * Your HDL (Good Cholesterol) should be 40 or higher  * Your LDL (Bad Cholesterol) should be ideally <70. * Your Triglycerides should be 150 or lower  * Your Urine microalbumin (kidney function) should be <  30 * Your Body Mass Index should be 25 or lower   Please consider the following ways to cut down carbs and fat and increase fiber and micronutrients in your diet: - substitute whole grain for white bread or pasta - substitute brown rice for white rice - substitute 90-calorie flat bread pieces for slices of bread when possible - substitute sweet potatoes or yams for white potatoes - substitute humus for margarine - substitute tofu for cheese when possible -  substitute almond or rice milk for regular milk (would not drink soy milk daily due to concern for soy estrogen influence on breast cancer risk) - substitute dark chocolate for other sweets when possible - substitute water - can add lemon or orange slices for taste - for diet sodas (artificial sweeteners will trick your body that you can eat sweets without getting calories and will lead you to overeating and weight gain in the long run) - do not skip breakfast or other meals (this will slow down the metabolism and will result in more weight gain over time)  - can try smoothies made from fruit and almond/rice milk in am instead of regular breakfast - can also try old-fashioned (not instant) oatmeal made with almond/rice milk in am - order the dressing on the side when eating salad at a restaurant (pour less than half of the dressing on the salad) - eat as little meat as possible - can try juicing, but should not forget that juicing will get rid of the fiber, so would alternate with eating raw veg./fruits or drinking smoothies - use as little oil as possible, even when using olive oil - can dress a salad with a mix of balsamic vinegar and lemon juice, for e.g. - use agave nectar, stevia sugar, or regular sugar rather than artificial sweateners - steam or broil/roast veggies  - snack on veggies/fruit/nuts (unsalted, preferably) when possible, rather than processed foods - reduce or eliminate aspartame in diet (it is in diet sodas, chewing gum, etc) Read the labels!  Try to read Dr. Katherina Right book: "Program for Reversing Diabetes" for other ideas for healthy eating.

## 2022-08-21 LAB — TSH: TSH: 7.17 u[IU]/mL — ABNORMAL HIGH (ref 0.35–5.50)

## 2022-08-26 ENCOUNTER — Encounter: Payer: Self-pay | Admitting: Nurse Practitioner

## 2022-08-26 ENCOUNTER — Other Ambulatory Visit: Payer: Self-pay

## 2022-08-26 MED ORDER — LEVOTHYROXINE SODIUM 137 MCG PO TABS: 137.0000 ug | ORAL_TABLET | Freq: Every day | ORAL | 1 refills | Status: DC

## 2022-08-26 NOTE — Addendum Note (Signed)
Addended by: Carlus Pavlov on: 08/26/2022 12:12 PM   Modules accepted: Orders

## 2022-08-27 ENCOUNTER — Encounter: Payer: Self-pay | Admitting: Nurse Practitioner

## 2022-08-29 ENCOUNTER — Encounter: Payer: Self-pay | Admitting: Internal Medicine

## 2022-08-29 DIAGNOSIS — E1165 Type 2 diabetes mellitus with hyperglycemia: Secondary | ICD-10-CM

## 2022-08-29 DIAGNOSIS — E039 Hypothyroidism, unspecified: Secondary | ICD-10-CM

## 2022-09-01 ENCOUNTER — Other Ambulatory Visit (HOSPITAL_COMMUNITY): Payer: Self-pay

## 2022-09-01 MED ORDER — METFORMIN HCL 1000 MG PO TABS
1000.0000 mg | ORAL_TABLET | Freq: Two times a day (BID) | ORAL | 3 refills | Status: DC
  Filled 2022-09-01: qty 180, 90d supply, fill #0

## 2022-09-01 MED ORDER — INSULIN GLARGINE-YFGN 100 UNIT/ML ~~LOC~~ SOLN
45.0000 [IU] | Freq: Every day | SUBCUTANEOUS | 1 refills | Status: DC
  Filled 2022-09-01: qty 40, 88d supply, fill #0

## 2022-09-01 MED ORDER — HUMALOG KWIKPEN 200 UNIT/ML ~~LOC~~ SOPN
8.0000 [IU] | PEN_INJECTOR | Freq: Three times a day (TID) | SUBCUTANEOUS | 3 refills | Status: DC
  Filled 2022-09-01: qty 15, 84d supply, fill #0
  Filled 2023-01-22: qty 15, 84d supply, fill #1

## 2022-09-01 MED ORDER — INSULIN PEN NEEDLE 32G X 4 MM MISC
Freq: Four times a day (QID) | 3 refills | Status: AC
  Filled 2022-09-01: qty 300, 75d supply, fill #0
  Filled 2023-05-26: qty 300, 75d supply, fill #1
  Filled 2023-05-26: qty 100, 25d supply, fill #1

## 2022-09-01 MED ORDER — LEVOTHYROXINE SODIUM 137 MCG PO TABS
137.0000 ug | ORAL_TABLET | Freq: Every day | ORAL | 1 refills | Status: DC
  Filled 2022-09-01: qty 90, 90d supply, fill #0

## 2022-09-03 ENCOUNTER — Other Ambulatory Visit (HOSPITAL_COMMUNITY): Payer: Self-pay

## 2022-09-03 ENCOUNTER — Other Ambulatory Visit: Payer: Self-pay

## 2022-09-10 ENCOUNTER — Other Ambulatory Visit (HOSPITAL_COMMUNITY): Payer: Self-pay

## 2022-09-17 ENCOUNTER — Other Ambulatory Visit (HOSPITAL_COMMUNITY): Payer: Self-pay

## 2022-09-22 ENCOUNTER — Encounter: Payer: Self-pay | Admitting: Nurse Practitioner

## 2022-09-22 ENCOUNTER — Other Ambulatory Visit (HOSPITAL_COMMUNITY): Payer: Self-pay

## 2022-09-23 ENCOUNTER — Other Ambulatory Visit (HOSPITAL_COMMUNITY): Payer: Self-pay

## 2022-10-06 ENCOUNTER — Encounter: Payer: Self-pay | Admitting: Internal Medicine

## 2022-10-20 ENCOUNTER — Encounter: Payer: Self-pay | Admitting: Nurse Practitioner

## 2022-10-20 ENCOUNTER — Ambulatory Visit: Payer: Medicaid Other | Attending: Nurse Practitioner | Admitting: Nurse Practitioner

## 2022-10-20 VITALS — BP 124/74 | HR 84 | Ht 73.0 in | Wt 202.6 lb

## 2022-10-20 DIAGNOSIS — Z23 Encounter for immunization: Secondary | ICD-10-CM | POA: Diagnosis not present

## 2022-10-20 DIAGNOSIS — R42 Dizziness and giddiness: Secondary | ICD-10-CM

## 2022-10-20 DIAGNOSIS — R7989 Other specified abnormal findings of blood chemistry: Secondary | ICD-10-CM

## 2022-10-20 DIAGNOSIS — I10 Essential (primary) hypertension: Secondary | ICD-10-CM | POA: Diagnosis not present

## 2022-10-20 DIAGNOSIS — R0789 Other chest pain: Secondary | ICD-10-CM

## 2022-10-20 NOTE — Progress Notes (Signed)
Assessment & Plan:  Diana Fleming was seen today for hypertension.  Diagnoses and all orders for this visit:  Primary hypertension -     CMP14+EGFR Continue all antihypertensives as prescribed.  Reminded to bring in blood pressure log for follow  up appointment.  RECOMMENDATIONS: DASH/Mediterranean Diets are healthier choices for HTN.    Abnormal CBC -     CBC with Differential  Dizziness -     US Carotid Duplex Bilateral; Future  Atypical chest pain -     US Carotid Duplex Bilateral; Future  Need for Tdap vaccination -     Tdap vaccine greater than or equal to 7yo IM  Need for shingles vaccine -     Varicella-zoster vaccine IM    Patient has been counseled on age-appropriate routine health concerns for screening and prevention. These are reviewed and up-to-date. Referrals have been placed accordingly. Immunizations are up-to-date or declined.    Subjective:   Chief Complaint  Patient presents with   Hypertension   HPI Diana Fleming 57 y.o. female presents to office today for follow-up to hypertension.  She is followed by Dr. Cruzita Lederer for her diabetes which is not currently controlled.  She has a history of HTN, HPL, DM 2, Hypothyroidism (Followed by ENDO)    HTN Blood pressure is well-controlled with amlodipine 10 mg daily and Hyzaar 100-25 mg daily.  She does endorse intermittent right sided chest pain, dizziness and spasms in the right side of her neck.  BP Readings from Last 3 Encounters:  10/20/22 124/74  08/20/22 118/66  07/20/22 134/84  LDL at goal with high intensity statin Lab Results  Component Value Date   LDLCALC 64 12/17/2021     Review of Systems  Constitutional:  Negative for fever, malaise/fatigue and weight loss.  HENT: Negative.  Negative for nosebleeds.   Eyes: Negative.  Negative for blurred vision, double vision and photophobia.  Respiratory: Negative.  Negative for cough, hemoptysis, sputum production, shortness of breath and wheezing.    Cardiovascular:  Positive for chest pain. Negative for palpitations, orthopnea, claudication, leg swelling and PND.  Gastrointestinal: Negative.  Negative for heartburn, nausea and vomiting.  Musculoskeletal: Negative.  Negative for myalgias.  Neurological:  Positive for dizziness. Negative for focal weakness, seizures and headaches.  Psychiatric/Behavioral: Negative.  Negative for suicidal ideas.     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   Outpatient Medications Prior to Visit  Medication Sig Dispense Refill   amLODipine (NORVASC) 10 MG tablet Take 1 tablet (10 mg total) by mouth daily. 90 tablet 1   atorvastatin (LIPITOR) 80 MG tablet Take 1 tablet (80 mg total) by mouth daily at 6pm 90 tablet 3   Blood Glucose Monitoring Suppl (FREESTYLE LITE) w/Device KIT Use as instructed to test  blood sugar 2 times daily, in the morning and the evening 1 kit 0   glucose blood test strip Use as instructed. Check blood glucose level by fingerstick three times per day. 200 each 12   insulin lispro (HUMALOG KWIKPEN) 200 UNIT/ML KwikPen Inject 8-12 Units into the skin 3 (three) times daily. 30 mL 3   Insulin Pen Needle 32G X 4 MM MISC Use 4 (four) times daily. 300 each 3   Insulin Syringe-Needle U-100 (INSULIN SYRINGES) 31G X 5/16" 0.5 ML MISC Use as instructed. Inject into the skin once  nightly. 100 each 6   levothyroxine (SYNTHROID) 137 MCG tablet Take 1 tablet (137 mcg total) by mouth daily before breakfast. 90 tablet 1   losartan-hydrochlorothiazide (HYZAAR) 100-25 MG tablet TAKE 1 TABLET BY MOUTH DAILY. 90 tablet 1   metFORMIN (GLUCOPHAGE) 1000 MG tablet Take 1 tablet (1,000 mg total) by mouth 2 (two) times daily with a meal. 180 tablet 3   insulin glargine-yfgn (SEMGLEE, YFGN,) 100 UNIT/ML injection Inject 45 Units  into the skin daily. (Patient not taking: Reported on 10/20/2022) 40 mL 1   Lancets (FREESTYLE) lancets Use as instructed (Patient not taking: Reported on 08/20/2022) 100 each 12   OneTouch Delica Lancets 99991111 MISC Use as instructed to check blood sugar up to 3 times daily. E11.65 (Patient not taking: Reported on 08/20/2022) 100 each 11   No facility-administered medications prior to visit.    No Known Allergies     Objective:    BP 124/74   Pulse 84   Ht 6' 1"$  (1.854 m)   Wt 202 lb 9.6 oz (91.9 kg)   LMP 12/05/2012   SpO2 98%   BMI 26.73 kg/m  Wt Readings from Last 3 Encounters:  10/20/22 202 lb 9.6 oz (91.9 kg)  08/20/22 201 lb 9.6 oz (91.4 kg)  07/20/22 198 lb 9.6 oz (90.1 kg)    Physical Exam Vitals and nursing note reviewed.  Constitutional:      Appearance: She is well-developed.  HENT:     Head: Normocephalic and atraumatic.  Cardiovascular:     Rate and Rhythm: Normal rate and regular rhythm.     Heart sounds: Normal heart sounds. No murmur heard.    No friction rub. No gallop.  Pulmonary:     Effort: Pulmonary effort is normal. No tachypnea or respiratory distress.     Breath sounds: Normal breath sounds. No decreased breath sounds, wheezing, rhonchi or rales.  Chest:     Chest wall: No tenderness.  Abdominal:     General: Bowel sounds are normal.     Palpations: Abdomen is soft.  Musculoskeletal:        General: Normal range of motion.     Cervical back: Normal range of motion.  Skin:    General: Skin is warm and dry.   Neurological:     Mental Status: She is alert and oriented to person, place, and time.     Coordination: Coordination normal.  Psychiatric:        Behavior: Behavior normal. Behavior is cooperative.        Thought Content: Thought content normal.        Judgment: Judgment normal.          Patient has been counseled extensively about nutrition and exercise as well as the importance of adherence with medications and regular follow-up. The patient was given clear instructions to go to ER or return to medical center if symptoms don't improve, worsen or  new problems develop. The patient verbalized understanding.   Follow-up: Return in about 3 months (around 01/18/2023).   Gildardo Pounds, FNP-BC [facility] [place], [place]   10/20/2022, 11:45 AM

## 2022-10-20 NOTE — Progress Notes (Signed)
No concerns. 

## 2022-10-21 LAB — CBC WITH DIFFERENTIAL/PLATELET
Basophils Absolute: 0 10*3/uL (ref 0.0–0.2)
Basos: 1 %
EOS (ABSOLUTE): 0 10*3/uL (ref 0.0–0.4)
Eos: 1 %
Hematocrit: 43.9 % (ref 34.0–46.6)
Hemoglobin: 15.1 g/dL (ref 11.1–15.9)
Immature Grans (Abs): 0 10*3/uL (ref 0.0–0.1)
Immature Granulocytes: 0 %
Lymphocytes Absolute: 1.9 10*3/uL (ref 0.7–3.1)
Lymphs: 34 %
MCH: 29.9 pg (ref 26.6–33.0)
MCHC: 34.4 g/dL (ref 31.5–35.7)
MCV: 87 fL (ref 79–97)
Monocytes Absolute: 0.5 10*3/uL (ref 0.1–0.9)
Monocytes: 8 %
Neutrophils Absolute: 3.2 10*3/uL (ref 1.4–7.0)
Neutrophils: 56 %
Platelets: 215 10*3/uL (ref 150–450)
RBC: 5.05 x10E6/uL (ref 3.77–5.28)
RDW: 12.6 % (ref 11.7–15.4)
WBC: 5.7 10*3/uL (ref 3.4–10.8)

## 2022-10-21 LAB — CMP14+EGFR
ALT: 16 [IU]/L (ref 0–32)
AST: 14 [IU]/L (ref 0–40)
Albumin/Globulin Ratio: 1.7 (ref 1.2–2.2)
Albumin: 4.5 g/dL (ref 3.8–4.9)
Alkaline Phosphatase: 105 [IU]/L (ref 44–121)
BUN/Creatinine Ratio: 15 (ref 9–23)
BUN: 12 mg/dL (ref 6–24)
Bilirubin Total: 0.6 mg/dL (ref 0.0–1.2)
CO2: 24 mmol/L (ref 20–29)
Calcium: 10 mg/dL (ref 8.7–10.2)
Chloride: 97 mmol/L (ref 96–106)
Creatinine, Ser: 0.79 mg/dL (ref 0.57–1.00)
Globulin, Total: 2.7 g/dL (ref 1.5–4.5)
Glucose: 233 mg/dL — ABNORMAL HIGH (ref 70–99)
Potassium: 3.6 mmol/L (ref 3.5–5.2)
Sodium: 140 mmol/L (ref 134–144)
Total Protein: 7.2 g/dL (ref 6.0–8.5)
eGFR: 89 mL/min/{1.73_m2}

## 2022-10-26 ENCOUNTER — Other Ambulatory Visit: Payer: Self-pay

## 2022-10-26 ENCOUNTER — Encounter: Payer: Self-pay | Admitting: Internal Medicine

## 2022-10-26 ENCOUNTER — Ambulatory Visit (INDEPENDENT_AMBULATORY_CARE_PROVIDER_SITE_OTHER): Payer: 59 | Admitting: Internal Medicine

## 2022-10-26 ENCOUNTER — Other Ambulatory Visit (HOSPITAL_COMMUNITY): Payer: Self-pay

## 2022-10-26 VITALS — BP 138/90 | HR 80 | Ht 73.0 in | Wt 205.2 lb

## 2022-10-26 DIAGNOSIS — E1165 Type 2 diabetes mellitus with hyperglycemia: Secondary | ICD-10-CM | POA: Diagnosis not present

## 2022-10-26 DIAGNOSIS — E039 Hypothyroidism, unspecified: Secondary | ICD-10-CM

## 2022-10-26 LAB — POCT GLYCOSYLATED HEMOGLOBIN (HGB A1C): Hemoglobin A1C: 11.3 % — AB (ref 4.0–5.6)

## 2022-10-26 LAB — TSH: TSH: 3.26 u[IU]/mL (ref 0.35–5.50)

## 2022-10-26 LAB — T4, FREE: Free T4: 1.01 ng/dL (ref 0.60–1.60)

## 2022-10-26 MED ORDER — LOSARTAN POTASSIUM-HCTZ 100-25 MG PO TABS
1.0000 | ORAL_TABLET | Freq: Every day | ORAL | 1 refills | Status: DC
  Filled 2022-10-26: qty 30, 30d supply, fill #0

## 2022-10-26 MED ORDER — BASAGLAR KWIKPEN 100 UNIT/ML ~~LOC~~ SOPN
45.0000 [IU] | PEN_INJECTOR | Freq: Every day | SUBCUTANEOUS | 3 refills | Status: DC
  Filled 2022-10-26: qty 30, 66d supply, fill #0
  Filled 2023-01-22: qty 30, 66d supply, fill #1
  Filled 2023-04-25: qty 30, 66d supply, fill #2

## 2022-10-26 NOTE — Patient Instructions (Addendum)
Please continue Levothyroxine 137 mcg daily.  Take the thyroid hormone every day, with water, at least 30 minutes before breakfast, separated by at least 4 hours from: - acid reflux medications - calcium - iron - multivitamins  Please stop at the lab.  Please continue: - Metformin 1000 mg 2x a day with meals - Humalog 15-18 units 15 min before the 3 main meals  Please start: - Basaglar 30 units at bedtime x 2-3 days, then increase by 5 units every 3 days until you get to 45 units daily  STOP SWEET DRINKS!  Please return in 2 to 3 months.

## 2022-10-26 NOTE — Progress Notes (Signed)
Patient ID: Diana Fleming, female   DOB: 1966/04/22, 57 y.o.   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  She was previously seen by Dr. Loanne Drilling who was managing her hypothyroidism.  However, last visit with me 2 months ago.  Interim history: No increased urination, blurry vision, nausea, chest pain. She continues to drink on regular soda (Pepsi) a day but not quite every day.  Pt. has been dx with hypothyroidism in 2010, after radioactive iodine ablation >> on Levothyroxine 137 mcg, increased 07/2022.  Pt takes the levothyroxine: - in am - fasting - at least 30 min from b'fast - no calcium - no iron - no multivitamins - no PPIs - not on Biotin  I reviewed pt's thyroid tests: Lab Results  Component Value Date   TSH 7.17 (H) 08/21/2022   TSH 4.87 06/03/2022   TSH 0.50 11/06/2021   TSH 0.59 05/09/2021   TSH 0.017 (L) 02/05/2021   TSH 0.377 (L) 12/25/2020   TSH 0.045 (L) 09/18/2020   TSH 1.670 01/31/2020   TSH 3.200 03/20/2019   TSH 2.250 03/16/2018   FREET4 1.35 11/06/2021   FREET4 1.04 05/09/2021   FREET4 1.55 12/25/2020   Antithyroid antibodies: No results found for: "THGAB" No components found for: "TPOAB"  Pt denies feeling nodules in neck, hoarseness, dysphagia/odynophagia.  She has + FH of thyroid disorders in: mother. No FH of thyroid cancer.  No h/o radiation tx to head or neck exc. RAI tx. No recent use of iodine supplements.  Uncontrolled type 2 diabetes:  Reviewed HbA1c: Lab Results  Component Value Date   HGBA1C 12.0 (H) 08/20/2022   HGBA1C 10.6 (H) 06/03/2022   HGBA1C 9.4 (A) 04/17/2022   HGBA1C 9.7 (H) 01/09/2022   HGBA1C 8.5 (A) 10/17/2021   HGBA1C 7.1 (A) 07/11/2021   HGBA1C 8.0 (H) 12/25/2020   HGBA1C 9.2 (A) 09/18/2020   HGBA1C 8.5 (A) 01/31/2020   HGBA1C CANCELED 12/28/2019   At last visit she was on: - Metformin  1000 mg 2x a day, with meals >> 1x a day - Farxiga 10 mg in a.m. >> stopped b/c not covered - Ozempic 0.5 mg weekly >> stopped b/c not covered - Semglee 30 >> 45 units at bedtime,  increased 07/2022  I advised her to change to: - Metformin 1000 mg 2x a day with meals - Semglee 45 units at night >> off for ~2 mo!! As she was w/o insurance - Humalog 8-12 >> 15-18 units 15 min before the 3 main meals  Pt checks her sugars 2-3x a day and they are-per review of her meter download: - am: 153, 193-210 >> 208-255, 304 - 2h after b'fast: 208 >> 239 - before lunch: n/c - 2h after lunch: 255-340 >> 225, 292, 426 - before dinner: n/c - 2h after dinner: 248-393 >> 412 - bedtime: n/c - nighttime: n/c Lowest sugar was 153 >> 208 Highest sugar was 393 >> 426.  Glucometer: One Touch >> ReliOn  She tries to stay away from fried food, but she works in a [employer] and is not always able to do so.  - no CKD, last BUN/creatinine:  Lab Results  Component Value Date   BUN 12 10/20/2022   BUN 9 07/20/2022   CREATININE 0.79 10/20/2022   CREATININE 0.91 07/20/2022   - + HL; last set of lipids: Lab Results  Component  Value Date   CHOL 127 12/17/2021   HDL 48 12/17/2021   LDLCALC 64 12/17/2021   TRIG 73 12/17/2021   CHOLHDL 2.6 12/17/2021  On Lipitor 80 mg daily  - last eye exam was in 2022. No DR reportedly.   - + numbness and tingling in her feet.  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    Hypertension    Hypothyroidism    Pure hypercholesterolemia 05/11/2019   Past Surgical History:  Procedure Laterality Date   DILATION AND CURETTAGE, DIAGNOSTIC / THERAPEUTIC  2002   Radioiodine ablation     TUBAL LIGATION     Social History   Socioeconomic History   Marital status: Married    Spouse name:  Not on file   Number of children: Not on file   Years of education: Not on file   Highest education level: Not on file  Occupational History   Not on file  Tobacco Use   Smoking status: Never   Smokeless tobacco: Never  Vaping Use   Vaping Use: Never used  Substance and Sexual Activity   Alcohol use: Never   Drug use: Never   Sexual activity: Yes  Other Topics Concern   Not on file  Social History Narrative   Not on file   Social Determinants of Health   Financial Resource Strain: Not on file  Food Insecurity: Not on file  Transportation Needs: Not on file  Physical Activity: Not on file  Stress: Not on file  Social Connections: Not on file  Intimate Partner Violence: Not on file   Current Outpatient Medications on File Prior to Visit  Medication Sig Dispense Refill   amLODipine (NORVASC) 10 MG tablet Take 1 tablet (10 mg total) by mouth daily. 90 tablet 1   atorvastatin (LIPITOR) 80 MG tablet Take 1 tablet (80 mg total) by mouth daily at 6pm 90 tablet 3   Blood Glucose Monitoring Suppl (FREESTYLE LITE) w/Device KIT Use as instructed to test blood sugar 2 times daily, in the morning and the evening 1 kit 0   glucose blood test strip Use as instructed. Check blood glucose level by fingerstick three times per day. 200 each 12   insulin glargine-yfgn (SEMGLEE, YFGN,) 100 UNIT/ML injection Inject 45 Units  into the skin daily. (Patient not taking: Reported on 10/20/2022) 40 mL 1   insulin lispro (HUMALOG KWIKPEN) 200 UNIT/ML KwikPen Inject 8-12 Units into the skin 3 (three) times daily. 30 mL 3   Insulin Pen Needle 32G X 4 MM MISC Use 4 (four) times daily. 300 each 3   Insulin Syringe-Needle U-100 (INSULIN SYRINGES) 31G X 5/16" 0.5 ML MISC Use as instructed. Inject into the skin once nightly. 100 each 6   Lancets (FREESTYLE) lancets Use as instructed (Patient not taking: Reported on 08/20/2022) 100 each 12   levothyroxine (SYNTHROID) 137 MCG tablet Take 1 tablet (137 mcg total) by  mouth daily before breakfast. 90 tablet 1   losartan-hydrochlorothiazide (HYZAAR) 100-25 MG tablet TAKE 1 TABLET BY MOUTH DAILY. 90 tablet 1   metFORMIN (GLUCOPHAGE) 1000 MG tablet Take 1 tablet (1,000 mg total) by mouth 2 (two) times daily with a meal. 180 tablet 3   OneTouch Delica Lancets 99991111 MISC Use as instructed to check blood sugar up to 3 times  daily. E11.65 (Patient not taking: Reported on 08/20/2022) 100 each 11   No current facility-administered medications on file prior to visit.   No Known Allergies Family History  Problem Relation Age of Onset   Cancer Mother    Thyroid disease Mother    Heart attack Maternal Grandfather    Heart attack Maternal Grandmother    PE: BP (!) 138/90 (BP Location: Left Arm, Patient Position: Sitting, Cuff Size: Normal)   Pulse 80   Ht '6\' 1"'$  (1.854 m)   Wt 205 lb 3.2 oz (93.1 kg)   LMP 12/05/2012   SpO2 99%   BMI 27.07 kg/m  Wt Readings from Last 3 Encounters:  10/26/22 205 lb 3.2 oz (93.1 kg)  10/20/22 202 lb 9.6 oz (91.9 kg)  08/20/22 201 lb 9.6 oz (91.4 kg)   Constitutional: Slightly overweight, in NAD Eyes:  EOMI, no exophthalmos ENT: no neck masses, no cervical lymphadenopathy Cardiovascular: RRR, No MRG Respiratory: CTA B Musculoskeletal: no deformities Skin:no rashes Neurological: no tremor with outstretched hands Diabetic Foot Exam - Simple   Simple Foot Form Diabetic Foot exam was performed with the following findings: Yes 10/26/2022  8:17 AM  Visual Inspection No deformities, no ulcerations, no other skin breakdown bilaterally: Yes Sensation Testing Intact to touch and monofilament testing bilaterally: Yes Pulse Check Posterior Tibialis and Dorsalis pulse intact bilaterally: Yes Comments Onychodystrophy hallux toenails B    ASSESSMENT: 1.  Postablation hypothyroidism  2.  Type 2 diabetes, uncontrolled, with complications - PN  PLAN:  1. Patient with long-standing hypothyroidism, on levothyroxine therapy. -  latest thyroid labs reviewed with pt. >> patient is elevated: Lab Results  Component Value Date   TSH 7.17 (H) 08/21/2022  - she continues on LT4 137 mcg daily -dose increased at last visit - pt feels good on this dose. - we discussed about taking the thyroid hormone every day, with water, >30 minutes before breakfast, separated by >4 hours from acid reflux medications, calcium, iron, multivitamins. Pt. is taking it correctly. - will check thyroid tests today: TSH and fT4 - If labs are abnormal, she will need to return for repeat TFTs in 1.5 months  2.  Uncontrolled type 2 diabetes -Most recent HbA1c was higher, at 12%. -At last visit, she was not taking metformin twice a day and she was off Ozempic and [place] due to the fact that she did not have insurance.  Sugars were mostly in the 200s and 300s.  I advised her to increase metformin to twice a day, continue Semglee, and add Humalog before meals.  We discussed that if this was not affordable, we would need to add regular insulin 30 minutes before meals. -At today's visit, sugars are still very high, 200-400s.  Upon questioning, however, she ran out of [facility] and could not refill it due to high price, as she was without insurance.  At this visit, we discussed that we can work with any long-acting insulin.  For now, I sent Basaglar to her pharmacy as this appears to be covered.  However, I advised her that if this is not covered, to let me know so I can send another insulin.  Will start on the lower dose and increase as needed.  For now, I advised her to continue the same doses of metformin and Humalog.  At next visit, I am hoping that we can add a GLP-1 receptor agonist. I advised her to: Patient Instructions  Please continue Levothyroxine 137 mcg daily.  Take the thyroid  hormone every day, with water, at least 30 minutes before breakfast, separated by at least 4 hours from: - acid reflux medications - calcium - iron -  multivitamins  Please stop at the lab.  Please continue: - Metformin 1000 mg 2x a day with meals - Humalog 15-18 units 15 min before the 3 main meals  Please start: - Basaglar 30 units at bedtime x 2-3 days, then increase by 5 units every 3 days until you get to 45 units daily  STOP SWEET DRINKS!  Please return in 2 to 3 months.  - we checked her HbA1c: 11.3% (a little lower) - advised to check sugars at different times of the day - 4x a day, rotating check times - advised for yearly eye exams >> she is not UTD - return to clinic in 2-3 months  Office Visit on 10/26/2022  Component Date Value Ref Range Status   TSH 10/26/2022 3.26  0.35 - 5.50 uIU/mL Final   Free T4 10/26/2022 1.01  0.60 - 1.60 ng/dL Final   Comment: Specimens from patients who are undergoing biotin therapy and /or ingesting biotin supplements may contain high levels of biotin.  The higher biotin concentration in these specimens interferes with this Free T4 assay.  Specimens that contain high levels  of biotin may cause false high results for this Free T4 assay.  Please interpret results in light of the total clinical presentation of the patient.     Hemoglobin A1C 10/26/2022 11.3 (A)  4.0 - 5.6 % Final   Normal TFTs.  Philemon Kingdom, MD PhD [facility] Endocrinology

## 2022-11-03 ENCOUNTER — Other Ambulatory Visit: Payer: Self-pay

## 2022-11-11 IMAGING — MG MM DIGITAL SCREENING BILAT W/ TOMO AND CAD
6 of 10 series · 6 of 30 positions shown · non-contrast
Comparison: Previous exam(s).

CLINICAL DATA: Screening.

EXAM:
DIGITAL SCREENING BILATERAL MAMMOGRAM WITH TOMOSYNTHESIS AND CAD
TECHNIQUE: Bilateral screening digital craniocaudal and mediolateral oblique
mammograms were obtained. Bilateral screening digital breast
tomosynthesis was performed. The images were evaluated with
computer-aided detection.

[R MLO synth-2D]
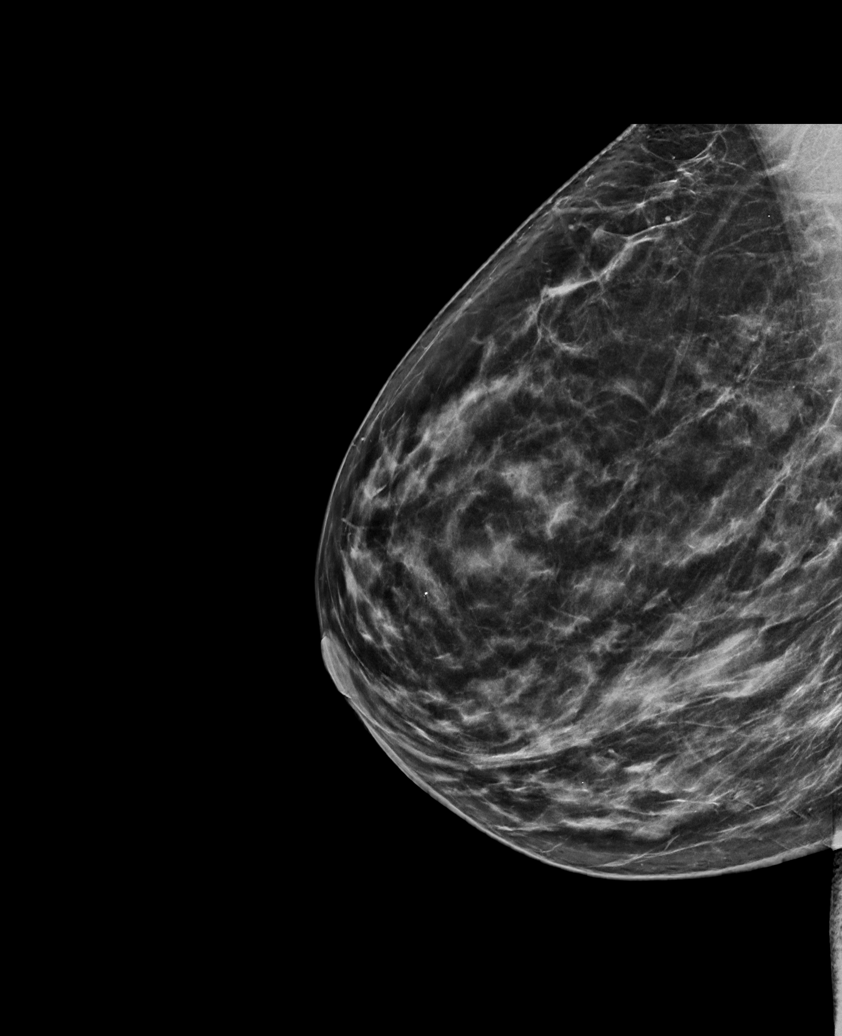

[R CC synth-2D]
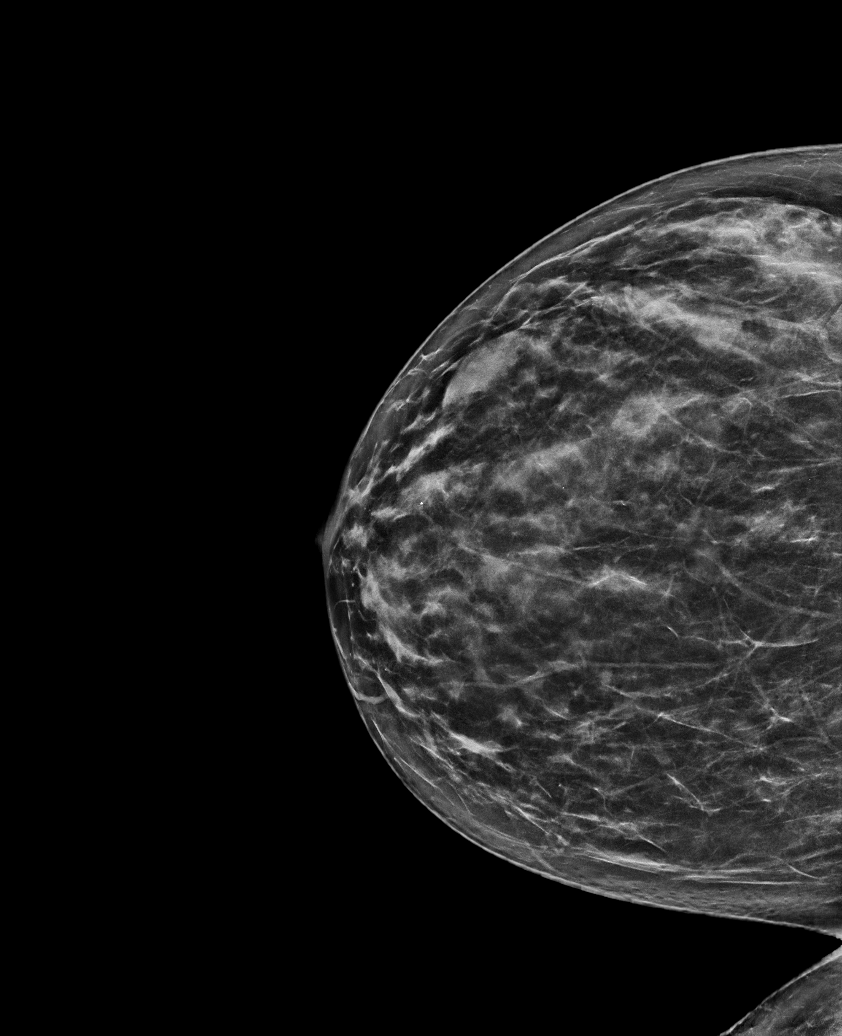

[L MLO synth-2D (1 of 2)]
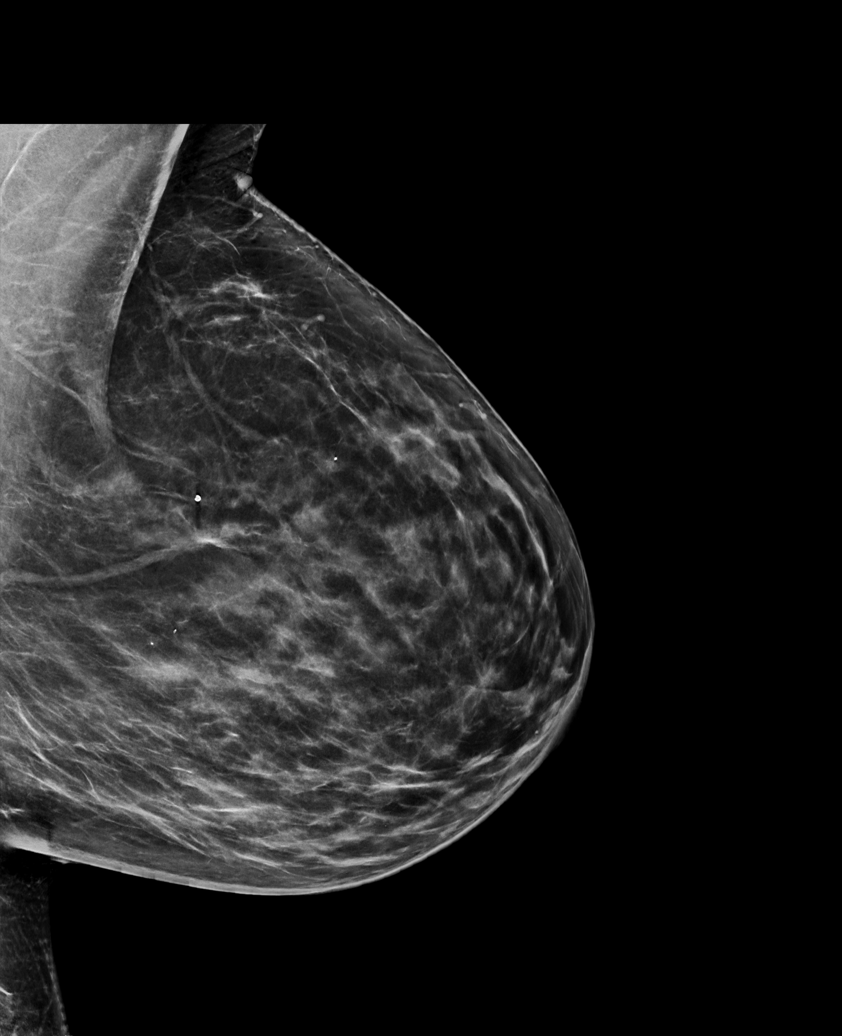

[L MLO synth-2D (2 of 2)]
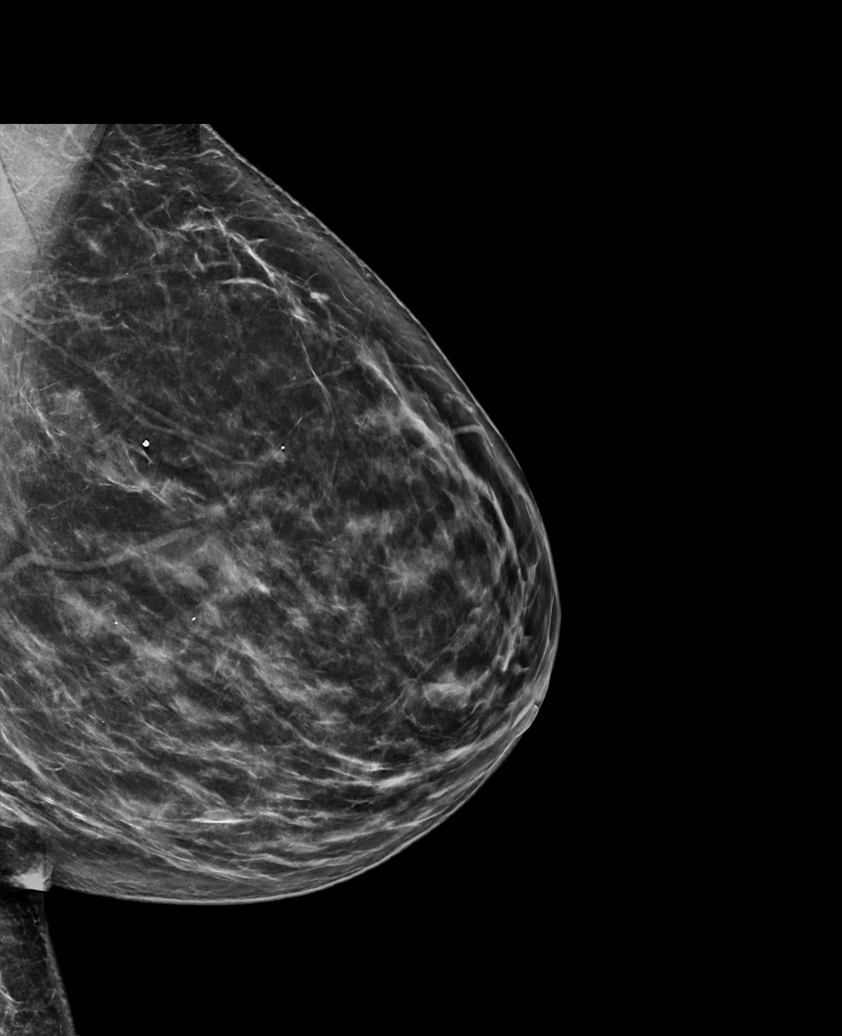

[L CC synth-2D]
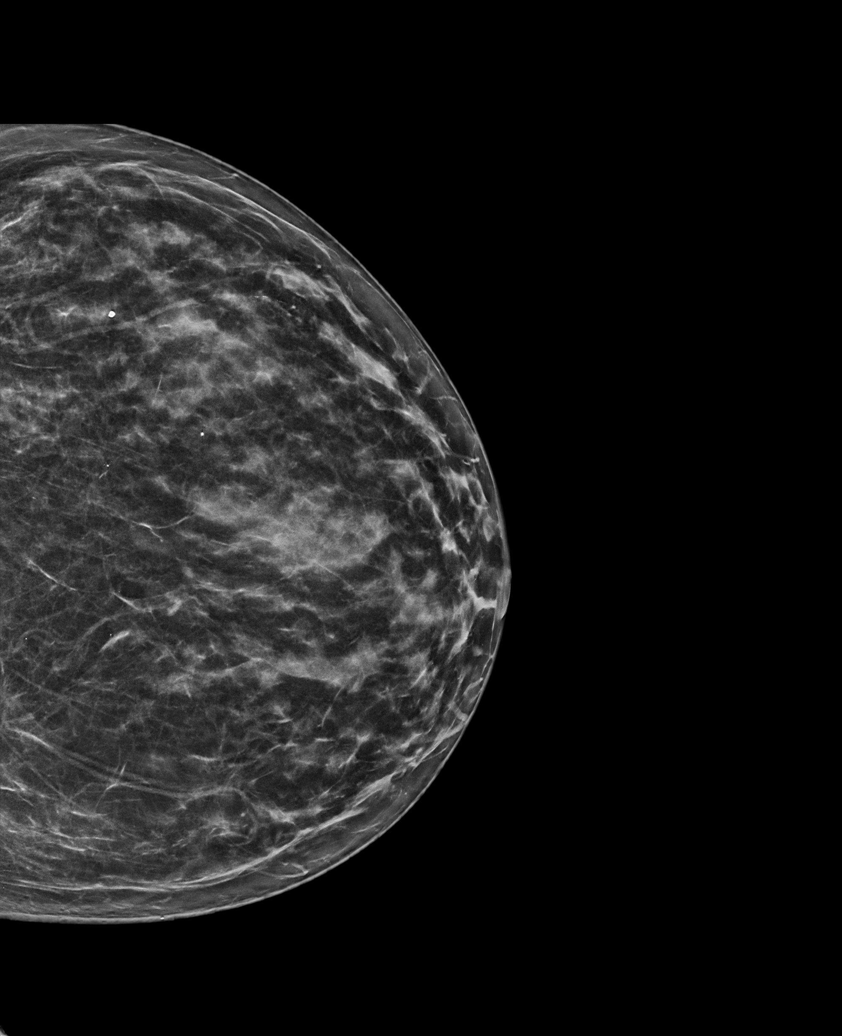

[R CC tomo · tomo slice 34/67.0]
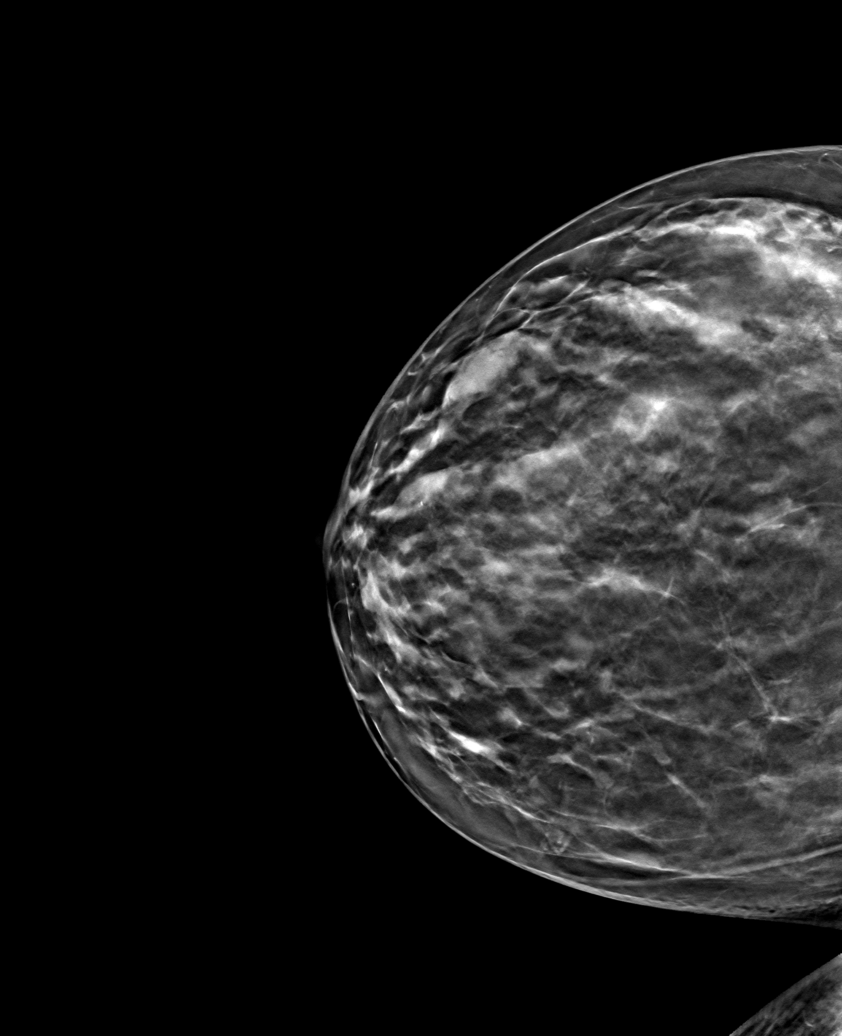

[6 of 30 positions shown; findings below may reference images not displayed]

ACR Breast Density Category c: The breast tissue is heterogeneously
dense, which may obscure small masses.
FINDINGS: There are no findings suspicious for malignancy.
IMPRESSION: No mammographic evidence of malignancy. A result letter of this
screening mammogram will be mailed directly to the patient.

RECOMMENDATION:
Screening mammogram in one year. (Code:[id number])

BI-RADS CATEGORY  1: Negative.

## 2022-11-12 ENCOUNTER — Telehealth: Payer: Self-pay

## 2022-11-12 NOTE — Progress Notes (Signed)
Patient attempted to be outreached by Park Liter, PharmD Candidate on 11/11/22 to discuss hypertension. Phone number unable to be dialed, could not LVM.   Joseph Art, Pharm.D. PGY-2 [facility] Resident

## 2022-11-18 ENCOUNTER — Ambulatory Visit
Admission: RE | Admit: 2022-11-18 | Discharge: 2022-11-18 | Disposition: A | Discharge status: Home / Self Care | Payer: 59 | Source: Ambulatory Visit | Attending: Nurse Practitioner | Admitting: Nurse Practitioner

## 2022-11-18 ENCOUNTER — Other Ambulatory Visit: Payer: Medicaid Other

## 2022-11-18 DIAGNOSIS — R42 Dizziness and giddiness: Secondary | ICD-10-CM | POA: Diagnosis not present

## 2022-11-18 DIAGNOSIS — R079 Chest pain, unspecified: Secondary | ICD-10-CM | POA: Diagnosis not present

## 2022-11-18 DIAGNOSIS — I6523 Occlusion and stenosis of bilateral carotid arteries: Secondary | ICD-10-CM | POA: Diagnosis not present

## 2022-11-18 DIAGNOSIS — R0789 Other chest pain: Secondary | ICD-10-CM

## 2022-11-24 NOTE — Telephone Encounter (Signed)
Patient attempted to be outreached by Georga Kaufmann, PharmD Candidate on 11/24/22 to discuss hypertension. Phone number disconnected (unable to leave voicemail).   Georga Kaufmann, PharmD Candidate   Maryan Puls, PharmD PGY-1 [facility] Pharm[facility] Resident

## 2022-12-28 ENCOUNTER — Other Ambulatory Visit: Payer: Self-pay | Admitting: Nurse Practitioner

## 2022-12-28 DIAGNOSIS — I10 Essential (primary) hypertension: Secondary | ICD-10-CM

## 2022-12-29 NOTE — Telephone Encounter (Signed)
Requested medication (s) are due for refill today: yes  Requested medication (s) are on the active medication list: yes  Last refill:  01/09/22 #90 1 refills  Future visit scheduled: yes in 3 weeks  Notes to clinic:  last dispensed 11/03/22. Do you want to refill for #30 or #90?     Requested Prescriptions  Pending Prescriptions Disp Refills   losartan-hydrochlorothiazide (HYZAAR) 100-25 MG tablet 90 tablet 1    Sig: TAKE 1 TABLET BY MOUTH DAILY.     Cardiovascular: ARB + Diuretic Combos Failed - 12/28/2022  8:16 PM      Failed - Last BP in normal range    BP Readings from Last 1 Encounters:  10/26/22 (!) 138/90         Passed - K in normal range and within 180 days    Potassium  Date Value Ref Range Status  10/20/2022 3.6 3.5 - 5.2 mmol/L Final         Passed - Na in normal range and within 180 days    Sodium  Date Value Ref Range Status  10/20/2022 140 134 - 144 mmol/L Final         Passed - Cr in normal range and within 180 days    Creatinine, Ser  Date Value Ref Range Status  10/20/2022 0.79 0.57 - 1.00 mg/dL Final   Creatinine,U  Date Value Ref Range Status  06/03/2022 109.7 mg/dL Final         Passed - eGFR is 10 or above and within 180 days    GFR calc Af Amer  Date Value Ref Range Status  09/18/2020 110 >59 mL/min/1.73 Final    Comment:    **In accordance with recommendations from the NKF-ASN Task force,**   [facility] is in the process of updating its eGFR calculation to the   2021 CKD-EPI creatinine equation that estimates kidney function   without a race variable.    GFR calc non Af Amer  Date Value Ref Range Status  09/18/2020 95 >59 mL/min/1.73 Final   GFR  Date Value Ref Range Status  06/03/2022 78.94 >60.00 mL/min Final    Comment:    Calculated using the CKD-EPI Creatinine Equation (2021)   eGFR  Date Value Ref Range Status  10/20/2022 89 >59 mL/min/1.73 Final         Passed - Patient is not pregnant      Passed - Valid encounter  within last 6 months    Recent Outpatient Visits           2 months ago Primary hypertension   [place] [facility] [place], Shea Stakes, NP   5 months ago Primary hypertension   [place] [facility] [place], [place] W, NP   8 months ago Type 2 diabetes mellitus with hyperglycemia, without long-term current use of insulin [facility])   [place] [facility] [place], Shea Stakes, NP   11 months ago Encounter for Papanicolaou smear for cervical cancer screening   [place] [facility] [place], [place] W, NP   1 year ago Type 2 diabetes mellitus with hyperglycemia, without long-term current use of insulin [facility])   [place] [facility] [place], Shea Stakes, NP       Future Appointments             In 3 weeks Claiborne Rigg, NP [facility]

## 2022-12-30 ENCOUNTER — Other Ambulatory Visit: Payer: Self-pay

## 2022-12-30 MED ORDER — LOSARTAN POTASSIUM-HCTZ 100-25 MG PO TABS
1.0000 | ORAL_TABLET | Freq: Every day | ORAL | 0 refills | Status: DC
  Filled 2022-12-30: qty 30, 30d supply, fill #0

## 2023-01-11 ENCOUNTER — Encounter: Payer: Self-pay | Admitting: Internal Medicine

## 2023-01-11 ENCOUNTER — Ambulatory Visit (INDEPENDENT_AMBULATORY_CARE_PROVIDER_SITE_OTHER): Payer: 59 | Admitting: Internal Medicine

## 2023-01-11 VITALS — BP 120/78 | HR 92 | Ht 73.0 in | Wt 202.4 lb

## 2023-01-11 DIAGNOSIS — E1142 Type 2 diabetes mellitus with diabetic polyneuropathy: Secondary | ICD-10-CM | POA: Diagnosis not present

## 2023-01-11 DIAGNOSIS — Z794 Long term (current) use of insulin: Secondary | ICD-10-CM | POA: Diagnosis not present

## 2023-01-11 DIAGNOSIS — Z7984 Long term (current) use of oral hypoglycemic drugs: Secondary | ICD-10-CM

## 2023-01-11 DIAGNOSIS — E1165 Type 2 diabetes mellitus with hyperglycemia: Secondary | ICD-10-CM

## 2023-01-11 DIAGNOSIS — E039 Hypothyroidism, unspecified: Secondary | ICD-10-CM | POA: Diagnosis not present

## 2023-01-11 DIAGNOSIS — E119 Type 2 diabetes mellitus without complications: Secondary | ICD-10-CM

## 2023-01-11 LAB — POCT GLUCOSE (DEVICE FOR HOME USE): Glucose Fasting, POC: 230 mg/dL — AB (ref 70–99)

## 2023-01-11 LAB — POCT GLYCOSYLATED HEMOGLOBIN (HGB A1C): Hemoglobin A1C: 10.8 % — AB (ref 4.0–5.6)

## 2023-01-11 NOTE — Progress Notes (Signed)
Patient ID: Diana Fleming, female   DOB: [birth year], 57 y.o.   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  She was previously seen by Dr. Everardo All who was managing her hypothyroidism.  However, last visit with me 2.5 months ago.  Interim history: No increased urination, has blurry vision, no nausea, no chest pain. At last visit she was drinking regular Pepsi >> still drinking these, although trying to reduce the amount  Pt. has been dx with hypothyroidism in 2010, after radioactive iodine ablation >> on Levothyroxine 137 mcg, increased 07/2022.  Pt takes the levothyroxine: - in am - fasting - at least 30 min from b'fast - no calcium - no iron - no multivitamins - no PPIs - not on Biotin  I reviewed pt's thyroid tests: Lab Results  Component Value Date   TSH 3.26 10/26/2022   TSH 7.17 (H) 08/21/2022   TSH 4.87 06/03/2022   TSH 0.50 11/06/2021   TSH 0.59 05/09/2021   TSH 0.017 (L) 02/05/2021   TSH 0.377 (L) 12/25/2020   TSH 0.045 (L) 09/18/2020   TSH 1.670 01/31/2020   TSH 3.200 03/20/2019   FREET4 1.01 10/26/2022   FREET4 1.35 11/06/2021   FREET4 1.04 05/09/2021   FREET4 1.55 12/25/2020   Antithyroid antibodies: No results found for: "THGAB" No components found for: "TPOAB"  Pt denies feeling nodules in neck, hoarseness, dysphagia/odynophagia.  She has + FH of thyroid disorders in: mother. No FH of thyroid cancer.  No h/o radiation tx to head or neck exc. RAI tx. No recent use of iodine supplements.  Uncontrolled type 2 diabetes:  Reviewed HbA1c: Lab Results  Component Value Date   HGBA1C 11.3 (A) 10/26/2022   HGBA1C 12.0 (H) 08/20/2022   HGBA1C 10.6 (H) 06/03/2022   HGBA1C 9.4 (A) 04/17/2022   HGBA1C 9.7 (H) 01/09/2022   HGBA1C 8.5 (A) 10/17/2021   HGBA1C 7.1 (A) 07/11/2021   HGBA1C 8.0 (H) 12/25/2020   HGBA1C 9.2 (A) 09/18/2020    HGBA1C 8.5 (A) 01/31/2020   Prev. on: - Metformin 1000 mg 2x a day, with meals >> 1x a day - Farxiga 10 mg in a.m. >> stopped b/c not covered - Ozempic 0.5 mg weekly >> stopped b/c not covered - Semglee 30 >> 45 units at bedtime,  increased 07/2022  Now on: - Metformin 1000 mg 2x a day with meals - Semglee 45 units at night >> off for ~2 mo >> restarted Basaglar 30 >> 45 units daily - Humalog 8-12 >> 15-18 units 15 min before the 3 main meals >> stopped as she misunderstood instructions!  Pt checks her sugars 2-3x a day and they are-per review of her meter download: - am: 153, 193-210 >> 208-255, 304 >> 182-255 - 2h after b'fast: 208 >> 239 >> 258, 330 - before lunch: n/c - 2h after lunch: 255-340 >> 225, 292, 426 >> n/c - before dinner: n/c - 2h after dinner: 248-393 >> 412 >> 389, 399 - bedtime: n/c - nighttime: n/c Lowest sugar was 153 >> 208 Highest sugar was 393 >> 426.  Glucometer: One Touch >> ReliOn  She tries to stay away from fried food, but she works in a [employer] and is not always able to do so.  - no CKD, last BUN/creatinine:  Lab Results  Component Value Date   BUN 12 10/20/2022   BUN 9 07/20/2022  CREATININE 0.79 10/20/2022   CREATININE 0.91 07/20/2022   - + HL; last set of lipids: Lab Results  Component Value Date   CHOL 127 12/17/2021   HDL 48 12/17/2021   LDLCALC 64 12/17/2021   TRIG 73 12/17/2021   CHOLHDL 2.6 12/17/2021  On Lipitor 80 mg daily  - last eye exam was in 2022. No DR reportedly.   - + numbness and tingling in her feet.  Last foot exam 10/26/2022.  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    Hypertension    Hypothyroidism    Pure hypercholesterolemia 05/11/2019   Past Surgical History:  Procedure Laterality Date   DILATION AND CURETTAGE,  DIAGNOSTIC / THERAPEUTIC  2002   Radioiodine ablation     TUBAL LIGATION     Social History   Socioeconomic History   Marital status: Married    Spouse name: Not on file   Number of children: Not on file   Years of education: Not on file   Highest education level: Not on file  Occupational History   Not on file  Tobacco Use   Smoking status: Never   Smokeless tobacco: Never  Vaping Use   Vaping Use: Never used  Substance and Sexual Activity   Alcohol use: Never   Drug use: Never   Sexual activity: Yes  Other Topics Concern   Not on file  Social History Narrative   Not on file   Social Determinants of Health   Financial Resource Strain: Not on file  Food Insecurity: Not on file  Transportation Needs: Not on file  Physical Activity: Not on file  Stress: Not on file  Social Connections: Not on file  Intimate Partner Violence: Not on file   Current Outpatient Medications on File Prior to Visit  Medication Sig Dispense Refill   amLODipine (NORVASC) 10 MG tablet Take 1 tablet (10 mg total) by mouth daily. 90 tablet 1   atorvastatin (LIPITOR) 80 MG tablet Take 1 tablet (80 mg total) by mouth daily at 6pm 90 tablet 3   Blood Glucose Monitoring Suppl (FREESTYLE LITE) w/Device KIT Use as instructed to test blood sugar 2 times daily, in the morning and the evening 1 kit 0   glucose blood test strip Use as instructed. Check blood glucose level by fingerstick three times per day. 200 each 12   Insulin Glargine (BASAGLAR KWIKPEN) 100 UNIT/ML Inject 45 Units into the skin at bedtime. 30 mL 3   insulin glargine-yfgn (SEMGLEE, YFGN,) 100 UNIT/ML injection Inject 45 Units  into the skin daily. (Patient not taking: Reported on 10/20/2022) 40 mL 1   insulin lispro (HUMALOG KWIKPEN) 200 UNIT/ML KwikPen Inject 8-12 Units into the skin 3 (three) times daily. 30 mL 3   Insulin Pen Needle 32G X 4 MM MISC Use 4 (four) times daily. 300 each 3   Insulin Syringe-Needle U-100 (INSULIN SYRINGES) 31G  X 5/16" 0.5 ML MISC Use as instructed. Inject into the skin once nightly. 100 each 6   Lancets (FREESTYLE) lancets Use as instructed (Patient not taking: Reported on 08/20/2022) 100 each 12   levothyroxine (SYNTHROID) 137 MCG tablet Take 1 tablet (137 mcg total) by mouth daily before breakfast. 90 tablet 1   losartan-hydrochlorothiazide (HYZAAR) 100-25 MG tablet TAKE 1 TABLET BY MOUTH DAILY. 90  tablet 0   metFORMIN (GLUCOPHAGE) 1000 MG tablet Take 1 tablet (1,000 mg total) by mouth 2 (two) times daily with a meal. 180 tablet 3   OneTouch Delica Lancets 33G MISC Use as instructed to check blood sugar up to 3 times daily. E11.65 (Patient not taking: Reported on 08/20/2022) 100 each 11   No current facility-administered medications on file prior to visit.   No Known Allergies Family History  Problem Relation Age of Onset   Cancer Mother    Thyroid disease Mother    Heart attack Maternal Grandfather    Heart attack Maternal Grandmother    PE: BP 120/78 (BP Location: Left Arm, Patient Position: Sitting, Cuff Size: Normal)   Pulse 92   Ht 6\' 1"  (1.854 m)   Wt 202 lb 6.4 oz (91.8 kg)   LMP 12/05/2012   SpO2 98%   BMI 26.70 kg/m  Wt Readings from Last 3 Encounters:  01/11/23 202 lb 6.4 oz (91.8 kg)  10/26/22 205 lb 3.2 oz (93.1 kg)  10/20/22 202 lb 9.6 oz (91.9 kg)   Constitutional: Slightly overweight, in NAD Eyes:  EOMI, no exophthalmos ENT: no neck masses, no cervical lymphadenopathy Cardiovascular: RRR, No MRG Respiratory: CTA B Musculoskeletal: no deformities Skin:no rashes Neurological: no tremor with outstretched hands  ASSESSMENT: 1.  Postablation hypothyroidism  2.  Type 2 diabetes, uncontrolled, with complications - PN  PLAN:  1. Patient with long-standing hypothyroidism, on levothyroxine therapy. - latest thyroid labs reviewed with pt. >> normal: Lab Results  Component Value Date   TSH 3.26 10/26/2022  - she continues on LT4 137 mcg daily - pt feels good on  this dose. - we discussed about taking the thyroid hormone every day, with water, >30 minutes before breakfast, separated by >4 hours from acid reflux medications, calcium, iron, multivitamins. Pt. is taking it correctly.  2.  Uncontrolled type 2 diabetes -At last visit, sugars were still very high, between 200s and 400s.  She ran out of [facility] and could not refill it due to price as she was without insurance.  In the past, she also had to come off Ozempic and [place] due to the lack of insurance.  We did discuss at last visit we could work with any long-acting insulin and I sent a prescription for Basaglar to her pharmacy, as this appears to be covered.  We continued the same dose of metformin and Humalog.  She was drinking regular Pepsi and I strongly advised her to stop. -At today's visit, she is still drinking regular sodas and I again advised her to stop completely -However, upon questioning, after starting Basaglar following her last visit, she misunderstood the instructions and actually stopped Humalog.  Therefore, her sugars are still elevated, mostly in the 200s in the morning but increasing to 300s-up to400 after dinner.  We discussed that this is a sign of inadequate mealtime coverage.  I advised her to restart Humalog and continue the rest of the regimen. -I advised her to: Patient Instructions  Please continue Levothyroxine 137 mcg daily.  Take the thyroid hormone every day, with water, at least 30 minutes before breakfast, separated by at least 4 hours from: - acid reflux medications - calcium - iron - multivitamins  Please continue: - Metformin 1000 mg 2x a day with meals - Basaglar 45 units at night  Restart: - Humalog 15-18 units 15 min before the 3 main meals  STOP SWEET DRINKS!  Please return in 3 months.  - we checked  her HbA1c: 10.8% (lower) - advised to check sugars at different times of the day - 4x a day, rotating check times - advised for yearly eye exams >>  she is not UTD - return to clinic in 3 months  Carlus Pavlov, MD PhD [facility] Endocrinology

## 2023-01-11 NOTE — Patient Instructions (Addendum)
Please continue Levothyroxine 137 mcg daily.  Take the thyroid hormone every day, with water, at least 30 minutes before breakfast, separated by at least 4 hours from: - acid reflux medications - calcium - iron - multivitamins  Please continue: - Metformin 1000 mg 2x a day with meals - Basaglar 45 units at night  Restart: - Humalog 15-18 units 15 min before the 3 main meals  STOP SWEET DRINKS!  Please return in 3 months.

## 2023-01-20 ENCOUNTER — Encounter: Payer: Self-pay | Admitting: Nurse Practitioner

## 2023-01-20 ENCOUNTER — Ambulatory Visit: Payer: 59 | Attending: Nurse Practitioner | Admitting: Nurse Practitioner

## 2023-01-20 VITALS — BP 142/80 | Ht 73.0 in | Wt 205.4 lb

## 2023-01-20 DIAGNOSIS — I10 Essential (primary) hypertension: Secondary | ICD-10-CM

## 2023-01-20 DIAGNOSIS — Z23 Encounter for immunization: Secondary | ICD-10-CM | POA: Diagnosis not present

## 2023-01-20 DIAGNOSIS — R222 Localized swelling, mass and lump, trunk: Secondary | ICD-10-CM | POA: Diagnosis not present

## 2023-01-20 MED ORDER — AMLODIPINE BESYLATE 10 MG PO TABS
10.0000 mg | ORAL_TABLET | Freq: Every day | ORAL | 1 refills | Status: DC
  Filled 2023-05-01: qty 30, 30d supply, fill #0
  Filled 2023-07-15: qty 30, 30d supply, fill #1

## 2023-01-20 MED ORDER — LOSARTAN POTASSIUM-HCTZ 100-25 MG PO TABS
1.0000 | ORAL_TABLET | Freq: Every day | ORAL | 1 refills | Status: DC
  Filled 2023-05-27: qty 30, 30d supply, fill #0

## 2023-01-20 NOTE — Progress Notes (Signed)
Assessment & Plan:  Diana Fleming was seen today for hypertension.  Diagnoses and all orders for this visit:  Primary hypertension -     losartan-hydrochlorothiazide (HYZAAR) 100-25 MG tablet; Take 1 tablet by mouth daily. -     amLODipine (NORVASC) 10 MG tablet; Take 1 tablet (10 mg total) by mouth daily. Continue all antihypertensives as prescribed.  Reminded to bring in blood pressure log for follow  up appointment.  RECOMMENDATIONS: DASH/Mediterranean Diets are healthier choices for HTN.    Need for shingles vaccine -     Varicella-zoster vaccine IM  Subcutaneous nodule of chest wall -     [place] CHEST SOFT TISSUE; Future    Patient has been counseled on age-appropriate routine health concerns for screening and prevention. These are reviewed and up-to-date. Referrals have been placed accordingly. Immunizations are up-to-date or declined.    Subjective:   Chief Complaint  Patient presents with   Hypertension   Hypertension Pertinent negatives include no blurred vision, chest pain, headaches, malaise/fatigue, palpitations or shortness of breath.   [name] 57 y.o. female presents to office today for follow up to HTN  She has a history of HTN, HPL, DM 2, Hypothyroidism (Followed by ENDO)     HTN Blood pressure is elevated today. She is currently taking amlodipine 10 mg daily and Hyzaar 100-25 mg daily.  Endorses recent marital stressors that she is dealing with currently. She has history of intermittent right sided chest pain, dizziness and spasms in the right side of her neck. Carotid dopplers negative for significant stenosis. BP Readings from Last 3 Encounters:  01/20/23 (!) 142/80  01/11/23 120/78  10/26/22 (!) 138/90      DM 2 FOLLOWED BY ENDO. She has not been taking either of her insulins daily as prescribed.  Lab Results  Component Value Date   HGBA1C 10.8 (A) 01/11/2023     Patient presents for evaluation of a cutaneous nodule. Lesion is located adjacent  to the left clavicle. Onset was several weeks ago. Symptoms have progressed to a point and plateaued. Nodule has associated symptoms of pain (shooting). Patient does not have previous history of cutaneous abscesses. Patient does have diabetes.    Review of Systems  Constitutional:  Negative for fever, malaise/fatigue and weight loss.  HENT: Negative.  Negative for nosebleeds.   Eyes: Negative.  Negative for blurred vision, double vision and photophobia.  Respiratory: Negative.  Negative for cough and shortness of breath.   Cardiovascular: Negative.  Negative for chest pain, palpitations and leg swelling.  Gastrointestinal: Negative.  Negative for heartburn, nausea and vomiting.  Musculoskeletal: Negative.  Negative for myalgias.  Skin:        SEE HPI  Neurological: Negative.  Negative for dizziness, focal weakness, seizures and headaches.  Psychiatric/Behavioral: Negative.  Negative for suicidal ideas.     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   Outpatient Medications Prior to Visit  Medication Sig Dispense Refill   atorvastatin (LIPITOR) 80 MG tablet Take 1 tablet (80 mg total) by mouth daily at 6pm 90 tablet 3   glucose blood test strip Use as instructed. Check blood glucose level by fingerstick three times per day. 200 each 12   Insulin Glargine (BASAGLAR KWIKPEN)  100 UNIT/ML Inject 45 Units into the skin at bedtime. 30 mL 3   insulin lispro (HUMALOG KWIKPEN) 200 UNIT/ML KwikPen Inject 8-12 Units into the skin 3 (three) times daily. 30 mL 3   Insulin Pen Needle 32G X 4 MM MISC Use 4 (four) times daily. 300 each 3   Insulin Syringe-Needle U-100 (INSULIN SYRINGES) 31G X 5/16" 0.5 ML MISC Use as instructed. Inject into the skin once nightly. 100 each 6   Lancets (FREESTYLE) lancets Use as instructed 100 each 12   levothyroxine (SYNTHROID) 137 MCG tablet Take 1 tablet (137 mcg total) by mouth daily before breakfast. 90 tablet 1   metFORMIN (GLUCOPHAGE) 1000 MG tablet Take 1 tablet (1,000 mg total) by mouth 2 (two) times daily with a meal. 180 tablet 3   OneTouch Delica Lancets 33G MISC Use as instructed to check blood sugar up to 3 times daily. E11.65 100 each 11   amLODipine (NORVASC) 10 MG tablet Take 1 tablet (10 mg total) by mouth daily. 90 tablet 1   losartan-hydrochlorothiazide (HYZAAR) 100-25 MG tablet TAKE 1 TABLET BY MOUTH DAILY. 90 tablet 0   Blood Glucose Monitoring Suppl (FREESTYLE LITE) w/Device KIT Use as instructed to test blood sugar 2 times daily, in the morning and the evening (Patient not taking: Reported on 01/20/2023) 1 kit 0   insulin glargine-yfgn (SEMGLEE, YFGN,) 100 UNIT/ML injection Inject 45 Units  into the skin daily. (Patient not taking: Reported on 10/20/2022) 40 mL 1   No facility-administered medications prior to visit.    No Known Allergies     Objective:    BP (!) 142/80 (BP Location: Left Arm, Patient Position: Sitting, Cuff Size: Normal)   Ht 6\' 1"  (1.854 m)   Wt 205 lb 6.4 oz (93.2 kg)   LMP 12/05/2012   SpO2 98%   BMI 27.10 kg/m  Wt Readings from Last 3 Encounters:  01/20/23 205 lb 6.4 oz (93.2 kg)  01/11/23 202 lb 6.4 oz (91.8 kg)  10/26/22 205 lb 3.2 oz (93.1 kg)    Physical Exam Vitals and nursing note reviewed.  Constitutional:      Appearance: She is well-developed.  HENT:     Head: Normocephalic and  atraumatic.  Cardiovascular:     Rate and Rhythm: Normal rate and regular rhythm.     Heart sounds: Normal heart sounds. No murmur heard.    No friction rub. No gallop.  Pulmonary:     Effort: Pulmonary effort is normal. No tachypnea or respiratory distress.     Breath sounds: Normal breath sounds. No decreased breath sounds, wheezing, rhonchi or rales.  Chest:     Chest wall: No tenderness.    Abdominal:     General: Bowel sounds are normal.     Palpations: Abdomen is soft.  Musculoskeletal:        General: Normal range of motion.     Cervical back: Normal range of motion.  Lymphadenopathy:     Upper Body:     Left upper body: Supraclavicular adenopathy  present.  Skin:    General: Skin is warm and dry.  Neurological:     Mental Status: She is alert and oriented to person, place, and time.     Coordination: Coordination normal.  Psychiatric:        Behavior: Behavior normal. Behavior is cooperative.        Thought Content: Thought content normal.        Judgment: Judgment normal.          Patient has been counseled extensively about nutrition and exercise as well as the importance of adherence with medications and regular follow-up. The patient was given clear instructions to go to ER or return to medical center if symptoms don't improve, worsen or new problems develop. The patient verbalized understanding.   Follow-up: Return in about 3 months (around 04/22/2023).   Claiborne Rigg, FNP-BC [facility] [place], [place] [phone]   01/20/2023, 9:06 AM

## 2023-01-22 ENCOUNTER — Other Ambulatory Visit: Payer: Self-pay

## 2023-01-27 ENCOUNTER — Other Ambulatory Visit: Payer: Self-pay

## 2023-02-19 ENCOUNTER — Other Ambulatory Visit: Payer: Self-pay | Admitting: Nurse Practitioner

## 2023-02-19 DIAGNOSIS — E785 Hyperlipidemia, unspecified: Secondary | ICD-10-CM

## 2023-04-09 ENCOUNTER — Other Ambulatory Visit: Payer: Self-pay | Admitting: Internal Medicine

## 2023-04-09 ENCOUNTER — Telehealth: Payer: Self-pay | Admitting: Internal Medicine

## 2023-04-09 DIAGNOSIS — E039 Hypothyroidism, unspecified: Secondary | ICD-10-CM

## 2023-04-09 MED ORDER — LEVOTHYROXINE SODIUM 137 MCG PO TABS: 137.0000 ug | ORAL_TABLET | Freq: Every day | ORAL | 2 refills | Status: DC

## 2023-04-09 NOTE — Telephone Encounter (Signed)
done

## 2023-04-09 NOTE — Telephone Encounter (Addendum)
MEDICATION: levothyroxine (SYNTHROID) 137 MCG tablet [[id number]]   PHARMACY:    [facility] [address] [place] - [address] (Ph: [phone])    HAS THE PATIENT CONTACTED THEIR PHARMACY?  Yes  IS THIS A 90 DAY SUPPLY : Yes  IS PATIENT OUT OF MEDICATION: Yes  IF NOT; HOW MUCH IS LEFT:   LAST APPOINTMENT DATE: @5 /13/2024  NEXT APPOINTMENT DATE:@9 /12/2022  DO WE HAVE YOUR PERMISSION TO LEAVE A DETAILED MESSAGE?: YEs  OTHER COMMENTS: Patient states that pharmacy told her to come to office.   **Let patient know to contact pharmacy at the end of the day to make sure medication is ready. **  ** Please notify patient to allow 48-72 hours to process**  **Encourage patient to contact the pharmacy for refills or they can request refills through [facility]**

## 2023-04-23 ENCOUNTER — Telehealth: Payer: Self-pay

## 2023-04-23 NOTE — Progress Notes (Signed)
Patient attempted to be outreached by Mack Guise on 04/23/23 to discuss hypertension. Left voicemail for patient to return our call at their convenience at [phone].  Mack Guise, Student-PharmD

## 2023-04-26 ENCOUNTER — Encounter: Payer: Self-pay | Admitting: Nurse Practitioner

## 2023-04-26 ENCOUNTER — Ambulatory Visit: Payer: 59 | Attending: Nurse Practitioner | Admitting: Nurse Practitioner

## 2023-04-26 ENCOUNTER — Other Ambulatory Visit: Payer: Self-pay

## 2023-04-26 VITALS — BP 120/78 | HR 89 | Ht 73.0 in | Wt 206.0 lb

## 2023-04-26 DIAGNOSIS — I10 Essential (primary) hypertension: Secondary | ICD-10-CM

## 2023-04-26 DIAGNOSIS — Z794 Long term (current) use of insulin: Secondary | ICD-10-CM | POA: Diagnosis not present

## 2023-04-26 DIAGNOSIS — E1165 Type 2 diabetes mellitus with hyperglycemia: Secondary | ICD-10-CM

## 2023-04-26 LAB — POCT GLYCOSYLATED HEMOGLOBIN (HGB A1C): Hemoglobin A1C: 9.6 % — AB (ref 4.0–5.6)

## 2023-04-26 NOTE — Progress Notes (Signed)
Assessment & Plan:  [name] was seen today for medical management of chronic issues.  Diagnoses and all orders for this visit:  Primary hypertension Continue amlodipine and hyzaar as prescribed.  Reminded to bring in blood pressure log for follow  up appointment.  RECOMMENDATIONS: DASH/Mediterranean Diets are healthier choices for HTN.    Type 2 diabetes mellitus with hyperglycemia, without long-term current use of insulin (HCC) -     POCT glycosylated hemoglobin (Hb A1C) -     CMP14+EGFR    Patient has been counseled on age-appropriate routine health concerns for screening and prevention. These are reviewed and up-to-date. Referrals have been placed accordingly. Immunizations are up-to-date or declined.    Subjective:   Chief Complaint  Patient presents with   Medical Management of Chronic Issues   HPI Diana Fleming 57 y.o. female presents to office today for follow up to HTN   She has a history of HTN, HPL, DM 2, Hypothyroidism   She is followed by ENDO for both thyroid and DM. Has been out of basaglar due to insurance and not having money to pay.     HTN Blood pressure is well-controlled today.  She endorses adherence taking amlodipine 10 mg daily and Hyzaar 100-25 mg daily. BP Readings from Last 3 Encounters:  04/26/23 120/78  01/20/23 (!) 142/80  01/11/23 120/78     Review of Systems  Constitutional:  Negative for fever, malaise/fatigue and weight loss.  HENT: Negative.  Negative for nosebleeds.   Eyes: Negative.  Negative for blurred vision, double vision and photophobia.  Respiratory: Negative.  Negative for cough and shortness of breath.   Cardiovascular: Negative.  Negative for chest pain, palpitations and leg swelling.  Gastrointestinal: Negative.  Negative for heartburn, nausea and vomiting.  Musculoskeletal: Negative.  Negative for myalgias.  Neurological: Negative.  Negative for dizziness, focal weakness, seizures and headaches.   Psychiatric/Behavioral: Negative.  Negative for suicidal ideas.     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   Outpatient Medications Prior to Visit  Medication Sig Dispense Refill   amLODipine (NORVASC) 10 MG tablet Take 1 tablet (10 mg total) by mouth daily. 90 tablet 1   atorvastatin (LIPITOR) 80 MG tablet TAKE 1 TABLET BY MOUTH ONCE DAILY 6PM 90 tablet 0   Blood Glucose Monitoring Suppl (FREESTYLE LITE) w/Device KIT Use as instructed to test blood sugar 2 times daily, in the morning and the evening (Patient not taking: Reported on 01/20/2023) 1 kit 0   glucose blood test strip Use as instructed. Check blood glucose level by fingerstick three times per day. 200 each 12   Insulin Glargine (BASAGLAR KWIKPEN) 100 UNIT/ML Inject 45 Units into the skin at bedtime. 30 mL 3   insulin lispro (HUMALOG KWIKPEN) 200 UNIT/ML KwikPen  Inject 8-12 Units into the skin 3 (three) times daily. 30 mL 3   Insulin Pen Needle 32G X 4 MM MISC Use 4 (four) times daily. 300 each 3   Insulin Syringe-Needle U-100 (INSULIN SYRINGES) 31G X 5/16" 0.5 ML MISC Use as instructed. Inject into the skin once nightly. 100 each 6   Lancets (FREESTYLE) lancets Use as instructed 100 each 12   levothyroxine (SYNTHROID) 137 MCG tablet Take 1 tablet (137 mcg total) by mouth daily before breakfast. 60 tablet 2    losartan-hydrochlorothiazide (HYZAAR) 100-25 MG tablet Take 1 tablet by mouth daily. 90 tablet 1   metFORMIN (GLUCOPHAGE) 1000 MG tablet Take 1 tablet (1,000 mg total) by mouth 2 (two) times daily with a meal. 180 tablet 3   OneTouch Delica Lancets 33G MISC Use as instructed to check blood sugar up to 3 times daily. E11.65 100 each 11   No facility-administered medications prior to visit.    No Known Allergies     Objective:    BP 120/78 (BP Location: Left Arm, Patient Position: Sitting, Cuff Size: Normal)   Pulse 89   Ht 6\' 1"  (1.854 m)   Wt 206 lb (93.4 kg)   LMP 12/05/2012   SpO2 98%   BMI 27.18 kg/m  Wt Readings from Last 3 Encounters:  04/26/23 206 lb (93.4 kg)  01/20/23 205 lb 6.4 oz (93.2 kg)  01/11/23 202 lb 6.4 oz (91.8 kg)    Physical Exam Vitals and nursing note reviewed.  Constitutional:      Appearance: She is well-developed.  HENT:     Head: Normocephalic and atraumatic.  Cardiovascular:     Rate and Rhythm: Normal rate and regular rhythm.     Heart sounds: Normal heart sounds. No murmur heard.    No friction rub. No gallop.  Pulmonary:     Effort: Pulmonary effort is normal. No tachypnea or respiratory distress.     Breath sounds: Normal breath sounds. No decreased breath sounds, wheezing, rhonchi or rales.  Chest:     Chest wall: No tenderness.  Abdominal:     General: Bowel sounds are normal.     Palpations: Abdomen is soft.  Musculoskeletal:        General: Normal range of motion.     Cervical back: Normal range of motion.  Skin:    General: Skin is warm and dry.  Neurological:     Mental Status: She is alert and oriented to person, place, and time.     Coordination: Coordination normal.  Psychiatric:        Behavior: Behavior normal. Behavior is cooperative.        Thought Content: Thought content normal.        Judgment: Judgment normal.          Patient has been counseled extensively about nutrition and exercise as well as the  importance of adherence with medications and regular follow-up. The patient was given clear instructions to go to ER or return to medical center if symptoms don't improve, worsen or new problems develop. The patient verbalized understanding.   Follow-up: Return in about 3 months (around 07/27/2023) for HTN.   Claiborne Rigg, FNP-BC [facility] [place], [place] [phone]   04/26/2023, 9:29 AM

## 2023-04-27 LAB — CMP14+EGFR
ALT: 18 IU/L (ref 0–32)
AST: 15 IU/L (ref 0–40)
Albumin: 4.3 g/dL (ref 3.8–4.9)
Alkaline Phosphatase: 111 IU/L (ref 44–121)
BUN/Creatinine Ratio: 11 (ref 9–23)
BUN: 8 mg/dL (ref 6–24)
Bilirubin Total: 0.4 mg/dL (ref 0.0–1.2)
CO2: 24 mmol/L (ref 20–29)
Calcium: 9.4 mg/dL (ref 8.7–10.2)
Chloride: 100 mmol/L (ref 96–106)
Creatinine, Ser: 0.76 mg/dL (ref 0.57–1.00)
Globulin, Total: 3.1 g/dL (ref 1.5–4.5)
Glucose: 257 mg/dL — ABNORMAL HIGH (ref 70–99)
Potassium: 3.3 mmol/L — ABNORMAL LOW (ref 3.5–5.2)
Sodium: 142 mmol/L (ref 134–144)
Total Protein: 7.4 g/dL (ref 6.0–8.5)
eGFR: 92 mL/min/{1.73_m2} (ref >59)

## 2023-04-28 NOTE — Progress Notes (Signed)
Potassium is on the lower side of normal. Recommend supplementing with gatorade zero and making sure you are not over consuming water. No more than 84-90oz per day.

## 2023-05-01 ENCOUNTER — Other Ambulatory Visit (HOSPITAL_COMMUNITY): Payer: Self-pay

## 2023-05-01 MED FILL — Atorvastatin Calcium Tab 80 MG (Base Equivalent): ORAL | 30 days supply | Qty: 30 | Fill #0 | Status: AC

## 2023-05-03 ENCOUNTER — Other Ambulatory Visit (HOSPITAL_COMMUNITY): Payer: Self-pay

## 2023-05-03 MED ORDER — ATORVASTATIN CALCIUM 80 MG PO TABS
80.0000 mg | ORAL_TABLET | Freq: Every day | ORAL | 0 refills | Status: DC
  Filled 2023-05-03 – 2023-06-21 (×2): qty 30, 30d supply, fill #0
  Filled 2023-08-06: qty 30, 30d supply, fill #1

## 2023-05-03 MED ORDER — AMLODIPINE BESYLATE 10 MG PO TABS
10.0000 mg | ORAL_TABLET | Freq: Every day | ORAL | 1 refills | Status: DC
  Filled 2023-05-03: qty 30, 30d supply, fill #0

## 2023-05-05 ENCOUNTER — Other Ambulatory Visit (HOSPITAL_COMMUNITY): Payer: Self-pay

## 2023-05-05 MED ORDER — LEVOTHYROXINE SODIUM 137 MCG PO TABS
137.0000 ug | ORAL_TABLET | Freq: Every day | ORAL | 2 refills | Status: DC
  Filled 2023-05-05: qty 30, 30d supply, fill #0
  Filled 2023-06-07: qty 30, 30d supply, fill #1
  Filled 2023-07-08: qty 30, 30d supply, fill #2
  Filled 2023-08-05: qty 30, 30d supply, fill #3
  Filled 2023-08-06: qty 30, 30d supply, fill #0
  Filled 2023-09-09: qty 30, 30d supply, fill #1
  Filled 2023-10-08: qty 30, 30d supply, fill #2

## 2023-05-06 ENCOUNTER — Other Ambulatory Visit (HOSPITAL_COMMUNITY): Payer: Self-pay

## 2023-05-06 ENCOUNTER — Encounter: Payer: Self-pay | Admitting: Internal Medicine

## 2023-05-06 ENCOUNTER — Ambulatory Visit (INDEPENDENT_AMBULATORY_CARE_PROVIDER_SITE_OTHER): Payer: 59 | Admitting: Internal Medicine

## 2023-05-06 VITALS — BP 118/70 | HR 103 | Ht 73.0 in | Wt 204.8 lb

## 2023-05-06 DIAGNOSIS — E1142 Type 2 diabetes mellitus with diabetic polyneuropathy: Secondary | ICD-10-CM | POA: Diagnosis not present

## 2023-05-06 DIAGNOSIS — E1165 Type 2 diabetes mellitus with hyperglycemia: Secondary | ICD-10-CM

## 2023-05-06 DIAGNOSIS — Z7984 Long term (current) use of oral hypoglycemic drugs: Secondary | ICD-10-CM | POA: Diagnosis not present

## 2023-05-06 DIAGNOSIS — Z794 Long term (current) use of insulin: Secondary | ICD-10-CM

## 2023-05-06 DIAGNOSIS — E039 Hypothyroidism, unspecified: Secondary | ICD-10-CM | POA: Diagnosis not present

## 2023-05-06 LAB — LIPID PANEL
Cholesterol: 121 mg/dL (ref 0–200)
HDL: 37.3 mg/dL — ABNORMAL LOW (ref >39.00)
LDL Cholesterol: 53 mg/dL (ref 0–99)
NonHDL: 83.86
Total CHOL/HDL Ratio: 3
Triglycerides: 153 mg/dL — ABNORMAL HIGH (ref 0.0–149.0)
VLDL: 30.6 mg/dL (ref 0.0–40.0)

## 2023-05-06 LAB — MICROALBUMIN / CREATININE URINE RATIO
Creatinine,U: 92.9 mg/dL
Microalb Creat Ratio: 4 mg/g (ref 0.0–30.0)
Microalb, Ur: 3.7 mg/dL — ABNORMAL HIGH (ref 0.0–1.9)

## 2023-05-06 MED ORDER — SEMAGLUTIDE(0.25 OR 0.5MG/DOS) 2 MG/3ML ~~LOC~~ SOPN
0.5000 mg | PEN_INJECTOR | SUBCUTANEOUS | 3 refills | Status: DC
  Filled 2023-05-06: qty 9, 84d supply, fill #0
  Filled 2023-10-26: qty 3, 28d supply, fill #0

## 2023-05-06 NOTE — Patient Instructions (Addendum)
Please continue Levothyroxine 137 mcg daily.  Take the thyroid hormone every day, with water, at least 30 minutes before breakfast, separated by at least 4 hours from: - acid reflux medications - calcium - iron - multivitamins  Please continue: - Metformin 1000 mg 2x a day with meals - Basaglar 45 units at night  Increase: - Humalog 14-18 units 15 min before the 3 main meals  Please start Ozempic 0.25 mg weekly in a.m. (for example on Sunday morning) x 2-4 weeks, then increase to 0.5 mg weekly in a.m. if no nausea or hypoglycemia.  Please return in 3-4 months.

## 2023-05-06 NOTE — Progress Notes (Addendum)
Patient ID: Diana Fleming, female   DOB: 1966/04/02, 57 y.o.   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  She was previously seen by Dr. Everardo All who was managing her hypothyroidism.  However, last visit with me 4 months ago. Insurance: [employer] education [employer] and [employer]  Interim history: No increased urination, blurry vision, nausea, no chest pain. She was previously drinking regular Pepsi >> stopped 5 days ago.  Pt. has been dx with hypothyroidism in 2010, after radioactive iodine ablation >> on Levothyroxine 137 mcg, increased 07/2022.  Pt takes the levothyroxine: - in am - fasting - at least 30 min from b'fast - no calcium - no iron - no multivitamins - no PPIs - not on Biotin  I reviewed pt's thyroid tests: Lab Results  Component Value Date   TSH 3.26 10/26/2022   TSH 7.17 (H) 08/21/2022   TSH 4.87 06/03/2022   TSH 0.50 11/06/2021   TSH 0.59 05/09/2021   TSH 0.017 (L) 02/05/2021   TSH 0.377 (L) 12/25/2020   TSH 0.045 (L) 09/18/2020   TSH 1.670 01/31/2020   TSH 3.200 03/20/2019   FREET4 1.01 10/26/2022   FREET4 1.35 11/06/2021   FREET4 1.04 05/09/2021   FREET4 1.55 12/25/2020   Antithyroid antibodies: No results found for: "THGAB" No components found for: "TPOAB"  Pt denies feeling nodules in neck, hoarseness, dysphagia/odynophagia.  She has + FH of thyroid disorders in: mother. No FH of thyroid cancer.  No h/o radiation tx to head or neck exc. RAI tx. No recent use of iodine supplements.  Uncontrolled type 2 diabetes:  Reviewed HbA1c: Lab Results  Component Value Date   HGBA1C 9.6 (A) 04/26/2023   HGBA1C 10.8 (A) 01/11/2023   HGBA1C 11.3 (A) 10/26/2022   HGBA1C 12.0 (H) 08/20/2022   HGBA1C 10.6 (H) 06/03/2022   HGBA1C 9.4 (A) 04/17/2022   HGBA1C 9.7 (H) 01/09/2022   HGBA1C 8.5 (A) 10/17/2021   HGBA1C 7.1 (A) 07/11/2021   HGBA1C 8.0 (H) 12/25/2020    Prev. on: - Metformin 1000 mg 2x a day, with meals >> 1x a day - Farxiga 10 mg in a.m. >> stopped b/c not covered - Ozempic 0.5 mg weekly >> stopped b/c not covered - Semglee 30 >> 45 units at bedtime,  increased 07/2022  Now on: - Metformin 1000 mg 2x a day with meals - Semglee 45 units at night >> off for ~2 mo >> restarted Basaglar 30 >> 45 units daily - Humalog 8-12 >> 15-18 units >> still using 12 units 15 min before the 3 main meals   Pt checks her sugars 2-3x a day and they are-per review of her meter download: - am: 153, 193-210 >> 208-255, 304 >> 182-255 >> 95, 172-209, 275 - 2h after b'fast: 208 >> 239 >> 258, 330 >> 254 - before lunch: n/c >> 116-241 - 2h after lunch: 255-340 >> 225, 292, 426 >> n/c >> 320, 345 - before dinner: n/c >> 151, 175 - 2h after dinner: 248-393 >> 412 >> 389, 399 >> 320, 349 - bedtime: n/c - nighttime: n/c Lowest sugar was 153 >> 208 >> 95 Highest sugar was 393 >> 426 >> 349.  Glucometer: One Touch >> [employer]  She tries to stay away from fried food, but she works in a [employer] and is not always able to do so.  - no CKD, last BUN/creatinine:  Lab  Results  Component Value Date   BUN 8 04/26/2023   BUN 12 10/20/2022   CREATININE 0.76 04/26/2023   CREATININE 0.79 10/20/2022   Lab Results  Component Value Date   MICRALBCREAT 1.8 06/03/2022   MICRALBCREAT 13 10/16/2020   - + HL; last set of lipids: Lab Results  Component Value Date   CHOL 127 12/17/2021   HDL 48 12/17/2021   LDLCALC 64 12/17/2021   TRIG 73 12/17/2021   CHOLHDL 2.6 12/17/2021  On Lipitor 80 mg daily  - last eye exam was in 2022. No DR reportedly.   - + numbness and tingling in her feet.  Last foot exam 10/26/2022.  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    Hypertension     Hypothyroidism    Pure hypercholesterolemia 05/11/2019   Past Surgical History:  Procedure Laterality Date   DILATION AND CURETTAGE, DIAGNOSTIC / THERAPEUTIC  2002   Radioiodine ablation     TUBAL LIGATION     Social History   Socioeconomic History   Marital status: Married    Spouse name: Not on file   Number of children: Not on file   Years of education: Not on file   Highest education level: GED or equivalent  Occupational History   Not on file  Tobacco Use   Smoking status: Never   Smokeless tobacco: Never  Vaping Use   Vaping status: Never Used  Substance and Sexual Activity   Alcohol use: Never   Drug use: Never   Sexual activity: Yes  Other Topics Concern   Not on file  Social History Narrative   Not on file   Social Determinants of Health   Financial Resource Strain: Medium Risk (01/18/2023)   Overall Financial Resource Strain (CARDIA)    Difficulty of Paying Living Expenses: Somewhat hard  Food Insecurity: Food Insecurity Present (01/18/2023)   Hunger Vital Sign    Worried About Running Out of Food in the Last Year: Never true    Ran Out of Food in the Last Year: Sometimes true  Transportation Needs: No Transportation Needs (01/18/2023)   PRAPARE - Administrator, Civil Service (Medical): No    Lack of Transportation (Non-Medical): No  Physical Activity: Unknown (01/18/2023)   Exercise Vital Sign    Days of Exercise per Week: 0 days    Minutes of Exercise per Session: Not on file  Stress: No Stress Concern Present (01/18/2023)   [employer] of Occupational Health - Occupational Stress Questionnaire    Feeling of Stress : Not at all  Social Connections: Moderately Integrated (01/18/2023)   Social Connection and Isolation Panel [NHANES]    Frequency of Communication with Friends and Family: Twice a week    Frequency of Social Gatherings with Friends and Family: Twice a week    Attends Religious Services: More than 4 times per year    Active  Member of [employer] or Organizations: No    Attends Engineer, structural: Not on file    Marital Status: Married  Catering manager Violence: Not on file   Current Outpatient Medications on File Prior to Visit  Medication Sig Dispense Refill   amLODipine (NORVASC) 10 MG tablet Take 1 tablet (10 mg total) by mouth daily. 90 tablet 1   amLODipine (NORVASC)  10 MG tablet Take 1 tablet (10 mg total) by mouth daily. 90 tablet 1   atorvastatin (LIPITOR) 80 MG tablet Take 1 tablet (80 mg total) by mouth daily at 6PM. 90 tablet 0   atorvastatin (LIPITOR) 80 MG tablet Take 1 tablet (80 mg total) by mouth daily at 6 PM. 90 tablet 0   Blood Glucose Monitoring Suppl (FREESTYLE LITE) w/Device KIT Use as instructed to test blood sugar 2 times daily, in the morning and the evening (Patient not taking: Reported on 01/20/2023) 1 kit 0   glucose blood test strip Use as instructed. Check blood glucose level by fingerstick three times per day. 200 each 12   Insulin Glargine (BASAGLAR KWIKPEN) 100 UNIT/ML Inject 45 Units into the skin at bedtime. 30 mL 3   insulin lispro (HUMALOG KWIKPEN) 200 UNIT/ML KwikPen Inject 8-12 Units into the skin 3 (three) times daily. 30 mL 3   Insulin Pen Needle 32G X 4 MM MISC Use 4 (four) times daily. 300 each 3   Insulin Syringe-Needle U-100 (INSULIN SYRINGES) 31G X 5/16" 0.5 ML MISC Use as instructed. Inject into the skin once nightly. 100 each 6   Lancets (FREESTYLE) lancets Use as instructed 100 each 12   levothyroxine (SYNTHROID) 137 MCG tablet Take 1 tablet (137 mcg total) by mouth daily before breakfast. 60 tablet 2   levothyroxine (SYNTHROID) 137 MCG tablet Take 1 tablet (137 mcg total) by mouth once daily before breakfast. 60 tablet 2   losartan-hydrochlorothiazide (HYZAAR) 100-25 MG tablet Take 1 tablet by mouth daily. 90 tablet 1   metFORMIN (GLUCOPHAGE) 1000 MG tablet Take 1 tablet (1,000 mg total) by mouth 2 (two) times daily with a meal. 180 tablet 3   OneTouch Delica  Lancets 33G MISC Use as instructed to check blood sugar up to 3 times daily. E11.65 100 each 11   No current facility-administered medications on file prior to visit.   No Known Allergies Family History  Problem Relation Age of Onset   Cancer Mother    Thyroid disease Mother    Heart attack Maternal Grandfather    Heart attack Maternal Grandmother    PE: BP 118/70   Pulse (!) 103   Ht 6\' 1"  (1.854 m)   Wt 204 lb 12.8 oz (92.9 kg)   LMP 12/05/2012   SpO2 99%   BMI 27.02 kg/m  Wt Readings from Last 3 Encounters:  05/06/23 204 lb 12.8 oz (92.9 kg)  04/26/23 206 lb (93.4 kg)  01/20/23 205 lb 6.4 oz (93.2 kg)   Constitutional: Slightly overweight, in NAD Eyes:  EOMI, no exophthalmos ENT: no neck masses, no cervical lymphadenopathy Cardiovascular: Tachycardia, RR, No MRG Respiratory: CTA B Musculoskeletal: no deformities Skin:no rashes Neurological: no tremor with outstretched hands  ASSESSMENT: 1.  Postablation hypothyroidism  2.  Type 2 diabetes, uncontrolled, with complications - PN  PLAN:  1. Patient with long-standing hypothyroidism, on levothyroxine therapy. - latest thyroid labs reviewed with pt. >> normal: Lab Results  Component Value Date   TSH 3.26 10/26/2022  - she continues on LT4 137 mcg daily - pt feels good on this dose. - we discussed about taking the thyroid hormone every day, with water, >30 minutes before breakfast, separated by >4 hours from acid reflux medications, calcium, iron, multivitamins. Pt. is taking it correctly.  2.  Uncontrolled type 2 diabetes -Patient with uncontrolled diabetes, on a basal/bolus insulin regimen, along with metformin.  At last visit, HbA1c was better, at 10.8%, but still very  high.  She had another HbA1c obtained last month and this was lower, at 9.6%.  At last visit, she was still drinking regular sodas and I again advised her to stop completely.  At that time, after starting Basaglar at the previous visit, she  misunderstood instructions and actually stopped Humalog.  Therefore, sugars were still elevated and we discussed about restarting Humalog.  We continued the rest of the regimen.  Of note, in the past, she had to come off Ozempic and [place] due to lack of insurance. -At today's visit, sugars are still above target, with the lowest being at 98 and the highest in the 300s.  There is significant fluctuation in the blood sugars.  Upon questioning, she is using lower doses of Humalog than recommended.  I advised her to increase these and change the dose based on the size of the meal, since now she is taking a fixed dose.  I also recommended to try again to obtain a GLP-1 receptor agonist.  She agrees to start Ozempic after discussion about benefits and possible side effects.  Will start at a low dose and increase as tolerated.  Will continue the rest of the regimen for now. -I advised her to: Patient Instructions  Please continue Levothyroxine 137 mcg daily.  Take the thyroid hormone every day, with water, at least 30 minutes before breakfast, separated by at least 4 hours from: - acid reflux medications - calcium - iron - multivitamins  Please continue: - Metformin 1000 mg 2x a day with meals - Basaglar 45 units at night  Increase: - Humalog 14-18 units 15 min before the 3 main meals  Please start Ozempic 0.25 mg weekly in a.m. (for example on Sunday morning) x 2-4 weeks, then increase to 0.5 mg weekly in a.m. if no nausea or hypoglycemia.  Please return in 3-4 months.  - advised to check sugars at different times of the day - 4x a day, rotating check times - advised for yearly eye exams >> she is not UTD - return to clinic in 3-4 months  Component     Latest Ref Rng 05/06/2023  Triglycerides     0.0 - 149.0 mg/dL 528.4 (H)   HDL Cholesterol     >39.00 mg/dL 13.24 (L)   LDL (calc)     0 - 99 mg/dL 53   Total CHOL/HDL Ratio 3   MICROALB/CREAT RATIO     0.0 - 30.0 mg/g 4.0   Microalb,  Ur     0.0 - 1.9 mg/dL 3.7 (H)   Creatinine,U     mg/dL 40.1   Cholesterol     0 - 200 mg/dL 027   VLDL     0.0 - 25.3 mg/dL 66.4   NonHDL 40.34   LDL at goal, HDL slightly high. ACR normal.  Carlus Pavlov, MD PhD [facility] End[facility]

## 2023-05-13 ENCOUNTER — Other Ambulatory Visit: Payer: Self-pay

## 2023-05-17 ENCOUNTER — Other Ambulatory Visit (HOSPITAL_COMMUNITY): Payer: Self-pay

## 2023-05-26 ENCOUNTER — Other Ambulatory Visit (HOSPITAL_COMMUNITY): Payer: Self-pay

## 2023-05-26 MED ORDER — LOSARTAN POTASSIUM-HCTZ 100-25 MG PO TABS
1.0000 | ORAL_TABLET | Freq: Every day | ORAL | 2 refills | Status: DC
  Filled 2023-05-26: qty 30, 30d supply, fill #0
  Filled 2023-07-15: qty 30, 30d supply, fill #1

## 2023-05-27 ENCOUNTER — Other Ambulatory Visit (HOSPITAL_COMMUNITY): Payer: Self-pay

## 2023-06-10 ENCOUNTER — Other Ambulatory Visit (HOSPITAL_COMMUNITY): Payer: Self-pay

## 2023-06-21 ENCOUNTER — Other Ambulatory Visit: Payer: Self-pay

## 2023-07-08 ENCOUNTER — Other Ambulatory Visit: Payer: Self-pay

## 2023-07-15 MED FILL — Atorvastatin Calcium Tab 80 MG (Base Equivalent): ORAL | 30 days supply | Qty: 30 | Fill #1 | Status: AC

## 2023-07-16 ENCOUNTER — Other Ambulatory Visit (HOSPITAL_COMMUNITY): Payer: Self-pay

## 2023-07-30 ENCOUNTER — Ambulatory Visit: Payer: 59 | Admitting: Nurse Practitioner

## 2023-08-06 ENCOUNTER — Other Ambulatory Visit (HOSPITAL_COMMUNITY): Payer: Self-pay

## 2023-08-06 ENCOUNTER — Ambulatory Visit: Payer: 59 | Attending: Nurse Practitioner | Admitting: Nurse Practitioner

## 2023-08-06 ENCOUNTER — Other Ambulatory Visit: Payer: Self-pay

## 2023-08-06 ENCOUNTER — Encounter: Payer: Self-pay | Admitting: Nurse Practitioner

## 2023-08-06 VITALS — BP 129/79 | HR 98 | Ht 73.0 in | Wt 202.6 lb

## 2023-08-06 DIAGNOSIS — Z1211 Encounter for screening for malignant neoplasm of colon: Secondary | ICD-10-CM | POA: Diagnosis not present

## 2023-08-06 DIAGNOSIS — I10 Essential (primary) hypertension: Secondary | ICD-10-CM

## 2023-08-06 DIAGNOSIS — E1165 Type 2 diabetes mellitus with hyperglycemia: Secondary | ICD-10-CM

## 2023-08-06 DIAGNOSIS — Z1231 Encounter for screening mammogram for malignant neoplasm of breast: Secondary | ICD-10-CM | POA: Diagnosis not present

## 2023-08-06 DIAGNOSIS — E785 Hyperlipidemia, unspecified: Secondary | ICD-10-CM | POA: Diagnosis not present

## 2023-08-06 DIAGNOSIS — Z7984 Long term (current) use of oral hypoglycemic drugs: Secondary | ICD-10-CM | POA: Diagnosis not present

## 2023-08-06 DIAGNOSIS — Z23 Encounter for immunization: Secondary | ICD-10-CM

## 2023-08-06 LAB — POCT GLYCOSYLATED HEMOGLOBIN (HGB A1C): Hemoglobin A1C: 10.8 % — AB (ref 4.0–5.6)

## 2023-08-06 MED ORDER — ATORVASTATIN CALCIUM 80 MG PO TABS
80.0000 mg | ORAL_TABLET | Freq: Every day | ORAL | 1 refills | Status: DC
  Filled 2023-08-06: qty 90, 90d supply, fill #0
  Filled 2023-09-09: qty 30, 30d supply, fill #0
  Filled 2023-10-19: qty 30, 30d supply, fill #1

## 2023-08-06 MED ORDER — LOSARTAN POTASSIUM-HCTZ 100-25 MG PO TABS
1.0000 | ORAL_TABLET | Freq: Every day | ORAL | 2 refills | Status: DC
  Filled 2023-08-06: qty 90, 90d supply, fill #0
  Filled 2023-09-09: qty 30, 30d supply, fill #0
  Filled 2023-10-19: qty 30, 30d supply, fill #1

## 2023-08-06 NOTE — Progress Notes (Unsigned)
Assessment & Plan:  Diana Fleming was seen today for medical management of chronic issues.  Diagnoses and all orders for this visit:  Type 2 diabetes mellitus with hyperglycemia, without long-term current use of insulin instructed to speak with pharmacy regarding a payment plan for medications -     POCT glycosylated hemoglobin (Hb A1C) -     CMP14+EGFR Continue blood sugar control as discussed in office today, low carbohydrate diet, and regular physical exercise as tolerated, 150 minutes per week (30 min each day, 5 days per week, or 50 min 3 days per week). Keep blood sugar logs with fasting goal of 90-130 mg/dl, post prandial (after you eat) less than 180.  For Hypoglycemia: BS <60 and Hyperglycemia BS >400; contact the clinic ASAP. Annual eye exams and foot exams are recommended.   Primary hypertension -     losartan-hydrochlorothiazide (HYZAAR) 100-25 MG tablet; Take 1 tablet by mouth daily. Continue all antihypertensives as prescribed.  Reminded to bring in blood pressure log for follow  up appointment.  RECOMMENDATIONS: DASH/Mediterranean Diets are healthier choices for HTN.    Hyperlipidemia, unspecified hyperlipidemia type -     atorvastatin (LIPITOR) 80 MG tablet; Take 1 tablet (80 mg total) by mouth daily at 6 PM. INSTRUCTIONS: Work on a low fat, heart healthy diet and participate in regular aerobic exercise program by working out at least 150 minutes per week; 5 days a week-30 minutes per day. Avoid red meat/beef/steak,  fried foods. junk foods, sodas, sugary drinks, unhealthy snacking, alcohol and smoking.  Drink at least 80 oz of water per day and monitor your carbohydrate intake daily.    Breast cancer screening by mammogram -     Cancel: MS 3D SCR MAMMO BILAT BR (aka MM); Future -     MM 3D SCREENING MAMMOGRAM BILATERAL BREAST; Future  Colon cancer screening -     Fecal occult blood, imunochemical([facility])  Influenza vaccine administered -     Flu vaccine trivalent  PF, 6mos and older(Flulaval,Afluria,Fluarix,Fluzone)    Patient has been counseled on age-appropriate routine health concerns for screening and prevention. These are reviewed and up-to-date. Referrals have been placed accordingly. Immunizations are up-to-date or declined.    Subjective:   Chief Complaint  Patient presents with   Medical Management of Chronic Issues    Diana Fleming 57 y.o. female presents to office today for follow up to HTN and DM  She has a history of HTN, HPL, DM 2, Hypothyroidism    Overdue for mammogram: referral has been placed.    DM 2 She has been experiencing some financial issues. No longer working and medication costs have been somewhat of a burden. She is not taking her insulin. A1c is not at goal.  Lab Results  Component Value Date   HGBA1C 10.8 (A) 08/06/2023  LDL at goal with high intensity statin.  Lab Results  Component Value Date   LDLCALC 53 05/06/2023    HTN Blood pressure is well controlled. She is currently prescribed hyzaar 100-25 mg daily and amlodipine 10 mg daily.  BP Readings from Last 3 Encounters:  08/06/23 129/79  05/06/23 118/70  04/26/23 120/78    Review of Systems  Constitutional:  Negative for fever, malaise/fatigue and weight loss.  HENT: Negative.  Negative for nosebleeds.   Eyes: Negative.  Negative for blurred vision, double vision and photophobia.  Respiratory: Negative.  Negative for cough and shortness of breath.   Cardiovascular: Negative.  Negative for chest pain, palpitations and leg  swelling.  Gastrointestinal: Negative.  Negative for heartburn, nausea and vomiting.  Musculoskeletal: Negative.  Negative for myalgias.  Neurological: Negative.  Negative for dizziness, focal weakness, seizures and headaches.  Psychiatric/Behavioral: Negative.  Negative for suicidal ideas.     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   Outpatient Medications Prior to Visit  Medication Sig Dispense Refill   amLODipine (NORVASC) 10 MG tablet Take 1 tablet (10 mg total) by mouth daily. 90 tablet 1   Blood Glucose Monitoring Suppl (FREESTYLE LITE) w/Device KIT Use as instructed to test blood sugar 2 times daily, in the morning and the evening 1 kit 0   glucose blood test strip Use as instructed. Check blood glucose level by fingerstick three times per day. 200 each 12   Insulin Glargine (BASAGLAR KWIKPEN) 100 UNIT/ML Inject 45 Units into the skin at bedtime. 30 mL 3   insulin lispro (HUMALOG KWIKPEN) 200 UNIT/ML KwikPen Inject 8-12 Units into the skin 3 (three) times daily. 30 mL 3   Insulin Pen Needle 32G X 4 MM MISC Use 4 (four) times daily. 300 each 3   Insulin Syringe-Needle U-100 (INSULIN SYRINGES) 31G X 5/16" 0.5 ML MISC Use as instructed. Inject into the skin once nightly. 100 each 6   Lancets (FREESTYLE) lancets Use as instructed 100 each 12   levothyroxine (SYNTHROID) 137 MCG tablet Take 1 tablet (137 mcg total) by mouth once daily before breakfast. 60 tablet 2   metFORMIN (GLUCOPHAGE) 1000 MG tablet Take 1 tablet (1,000 mg total) by mouth 2 (two) times daily with a meal. 180 tablet 3   OneTouch Delica Lancets 33G MISC Use as instructed to check blood sugar up to 3 times daily. E11.65 100 each 11   Semaglutide,0.25 or 0.5MG /DOS, 2 MG/3ML SOPN Inject 0.5 mg into the skin once a week. 9 mL  3   atorvastatin (LIPITOR) 80 MG tablet Take 1 tablet (80 mg total) by mouth daily at 6PM. 90 tablet 0   losartan-hydrochlorothiazide (HYZAAR) 100-25 MG tablet Take 1 tablet by mouth daily. 90 tablet 2   amLODipine (NORVASC) 10 MG tablet Take 1 tablet (10 mg total) by mouth daily. (Patient not taking: Reported on 08/06/2023) 90 tablet 1   atorvastatin (LIPITOR) 80 MG tablet Take 1 tablet (80 mg total) by mouth daily at 6 PM. (Patient not taking: Reported on 08/06/2023) 90 tablet 0   levothyroxine (SYNTHROID) 137 MCG tablet Take 1 tablet (137 mcg total) by mouth daily before breakfast. (Patient not taking: Reported on 08/06/2023) 60 tablet 2   losartan-hydrochlorothiazide (HYZAAR) 100-25 MG tablet Take 1 tablet by mouth daily. (Patient not taking: Reported on 08/06/2023) 90 tablet 1   No facility-administered medications prior to visit.    No Known Allergies     Objective:  BP 129/79 (BP Location: Left Arm, Patient Position: Sitting, Cuff Size: Normal)   Pulse 98   Ht 6\' 1"  (1.854 m)   Wt 202 lb 9.6 oz (91.9 kg)   LMP 12/05/2012   SpO2 100%   BMI 26.73 kg/m  Wt Readings from Last 3 Encounters:  08/06/23 202 lb 9.6 oz (91.9 kg)  05/06/23 204 lb 12.8 oz (92.9 kg)  04/26/23 206 lb (93.4 kg)    Physical Exam Vitals and nursing note reviewed.  Constitutional:      Appearance: She is well-developed.  HENT:     Head: Normocephalic and atraumatic.  Cardiovascular:     Rate and Rhythm: Normal rate and regular rhythm.     Heart sounds: Normal heart sounds. No murmur heard.    No friction rub. No gallop.  Pulmonary:     Effort: Pulmonary effort is normal. No tachypnea or respiratory distress.     Breath sounds: Normal breath sounds. No decreased breath sounds, wheezing, rhonchi or rales.  Chest:     Chest wall: No tenderness.  Abdominal:     General: Bowel sounds are normal.     Palpations: Abdomen is soft.  Musculoskeletal:        General: Normal range of motion.     Cervical  back: Normal range of motion.  Skin:    General: Skin is warm and dry.  Neurological:     Mental Status: She is alert and oriented to person, place, and time.     Coordination: Coordination normal.  Psychiatric:        Behavior: Behavior normal. Behavior is cooperative.        Thought Content: Thought content normal.        Judgment: Judgment normal.          Patient has been counseled extensively about nutrition and exercise as well as the importance of adherence with medications and regular follow-up. The patient was given clear instructions to go to ER or return to medical center if symptoms don't improve, worsen or new problems develop. The patient verbalized understanding.   Follow-up: Return in about 3 months (around 11/04/2023).   Claiborne Rigg, FNP-BC [facility] [place], [place] [phone]   08/11/2023, 11:17 PM

## 2023-08-07 LAB — CMP14+EGFR
ALT: 25 [IU]/L (ref 0–32)
AST: 14 [IU]/L (ref 0–40)
Albumin: 4.6 g/dL (ref 3.8–4.9)
Alkaline Phosphatase: 116 [IU]/L (ref 44–121)
BUN/Creatinine Ratio: 12 (ref 9–23)
BUN: 10 mg/dL (ref 6–24)
Bilirubin Total: 0.5 mg/dL (ref 0.0–1.2)
CO2: 25 mmol/L (ref 20–29)
Calcium: 9.8 mg/dL (ref 8.7–10.2)
Chloride: 98 mmol/L (ref 96–106)
Creatinine, Ser: 0.82 mg/dL (ref 0.57–1.00)
Globulin, Total: 2.8 g/dL (ref 1.5–4.5)
Glucose: 262 mg/dL — ABNORMAL HIGH (ref 70–99)
Potassium: 3.5 mmol/L (ref 3.5–5.2)
Sodium: 138 mmol/L (ref 134–144)
Total Protein: 7.4 g/dL (ref 6.0–8.5)
eGFR: 84 mL/min/{1.73_m2} (ref >59)

## 2023-08-16 ENCOUNTER — Other Ambulatory Visit (HOSPITAL_COMMUNITY): Payer: Self-pay

## 2023-09-09 ENCOUNTER — Other Ambulatory Visit: Payer: Self-pay

## 2023-09-09 ENCOUNTER — Other Ambulatory Visit (HOSPITAL_COMMUNITY): Payer: Self-pay

## 2023-09-10 ENCOUNTER — Encounter: Payer: Self-pay | Admitting: Internal Medicine

## 2023-09-10 ENCOUNTER — Ambulatory Visit (INDEPENDENT_AMBULATORY_CARE_PROVIDER_SITE_OTHER): Payer: 59 | Admitting: Internal Medicine

## 2023-09-10 ENCOUNTER — Telehealth: Payer: Self-pay

## 2023-09-10 VITALS — BP 120/70 | HR 96 | Ht 73.0 in | Wt 199.6 lb

## 2023-09-10 DIAGNOSIS — E1142 Type 2 diabetes mellitus with diabetic polyneuropathy: Secondary | ICD-10-CM

## 2023-09-10 DIAGNOSIS — E1165 Type 2 diabetes mellitus with hyperglycemia: Secondary | ICD-10-CM | POA: Diagnosis not present

## 2023-09-10 DIAGNOSIS — Z7984 Long term (current) use of oral hypoglycemic drugs: Secondary | ICD-10-CM

## 2023-09-10 DIAGNOSIS — E039 Hypothyroidism, unspecified: Secondary | ICD-10-CM | POA: Diagnosis not present

## 2023-09-10 DIAGNOSIS — Z794 Long term (current) use of insulin: Secondary | ICD-10-CM | POA: Diagnosis not present

## 2023-09-10 DIAGNOSIS — Z7985 Long-term (current) use of injectable non-insulin antidiabetic drugs: Secondary | ICD-10-CM

## 2023-09-10 NOTE — Telephone Encounter (Signed)
 Sample  Medication:Ozempic  Dose: 0.25/0.5 mg Quantity:1 box [id number]  Dicie Beam

## 2023-09-10 NOTE — Progress Notes (Signed)
 Patient ID: Diana Fleming, female   DOB: March 23, 1966, 58 y.o.   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  She was previously seen by Dr. Kassie who was managing her hypothyroidism.  However, last visit with me 4 months ago. Insurance: [employer]  Interim history: No increased urination, blurry vision, nausea, no chest pain.  Pt. has been dx with hypothyroidism in 2010, after radioactive iodine ablation >> on Levothyroxine  137 mcg, increased 07/2022.  Pt takes the levothyroxine : - in am - fasting - at least 30 min from b'fast - no calcium  - no iron - no multivitamins - no PPIs - not on Biotin  I reviewed pt's thyroid  tests: Lab Results  Component Value Date   TSH 3.26 10/26/2022   TSH 7.17 (H) 08/21/2022   TSH 4.87 06/03/2022   TSH 0.50 11/06/2021   TSH 0.59 05/09/2021   TSH 0.017 (L) 02/05/2021   TSH 0.377 (L) 12/25/2020   TSH 0.045 (L) 09/18/2020   TSH 1.670 01/31/2020   TSH 3.200 03/20/2019   FREET4 1.01 10/26/2022   FREET4 1.35 11/06/2021   FREET4 1.04 05/09/2021   FREET4 1.55 12/25/2020   Antithyroid antibodies: No results found for: THGAB No components found for: TPOAB  Pt denies feeling nodules in neck, hoarseness, dysphagia/odynophagia.  She has + FH of thyroid  disorders in: mother. No FH of thyroid  cancer.  No h/o radiation tx to head or neck exc. RAI tx. No recent use of iodine supplements.  Uncontrolled type 2 diabetes:  Reviewed HbA1c: Lab Results  Component Value Date   HGBA1C 10.8 (A) 08/06/2023   HGBA1C 9.6 (A) 04/26/2023   HGBA1C 10.8 (A) 01/11/2023   HGBA1C 11.3 (A) 10/26/2022   HGBA1C 12.0 (H) 08/20/2022   HGBA1C 10.6 (H) 06/03/2022   HGBA1C 9.4 (A) 04/17/2022   HGBA1C 9.7 (H) 01/09/2022   HGBA1C 8.5 (A) 10/17/2021   HGBA1C 7.1 (A) 07/11/2021   Prev. on: - Metformin  1000 mg 2x a day, with meals >> 1x a  day - Farxiga  10 mg in a.m. >> stopped b/c not covered - Ozempic  0.5 mg weekly >> stopped b/c not covered - Semglee  30 >> 45 units at bedtime,  increased 07/2022  Now on: - Metformin  1000 mg 2x a day with meals - Semglee  45 units at night >> off for ~2 mo >> restarted Basaglar  30 >> 45 units daily - Humalog  8-12 >> 15-18 units >> still using 12 units 15 min before the 3 main meals >> 14-18 units 15 min before the 3 main meals (actually using 12) - Ozempic  0.25 >> 0.5 mg weekly in a.m. (rec'd 05/2023 but did not get it)  Pt checks her sugars 0-1x a day and they are-per review of her meter download: - am: 208-255, 304 >> 182-255 >> 95, 172-209, 275 >> n/c - 2h after b'fast: 208 >> 239 >> 258, 330 >> 254 >> 373 - before lunch: n/c >> 116-241 - 2h after lunch: 255-340 >> 225, 292, 426 >> n/c >> 320, 345 >> 341 - before dinner: n/c >> 151, 175 >> n/c - 2h after dinner: 248-393 >> 412 >> 389, 399 >> 320, 349 >> n/c - bedtime: n/c - nighttime: n/c Lowest sugar was 153 >> 208 >> 95 >> 140 Highest sugar was 393 >> 426 >> 349 >> 373.  Glucometer: One Touch >> [employer]  She tries to stay  away from fried food, but she works in a [employer] and is not always able to do so.  - no CKD, last BUN/creatinine:  Lab Results  Component Value Date   BUN 10 08/06/2023   BUN 8 04/26/2023   CREATININE 0.82 08/06/2023   CREATININE 0.76 04/26/2023   Lab Results  Component Value Date   MICRALBCREAT 4.0 05/06/2023   MICRALBCREAT 1.8 06/03/2022   MICRALBCREAT 13 10/16/2020   - + HL; last set of lipids: Lab Results  Component Value Date   CHOL 121 05/06/2023   HDL 37.30 (L) 05/06/2023   LDLCALC 53 05/06/2023   TRIG 153.0 (H) 05/06/2023   CHOLHDL 3 05/06/2023  On Lipitor 80 mg daily  - last eye exam was in 2022. No DR reportedly.   - + numbness and tingling in her feet.  Last foot exam 10/26/2022.  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    Hypertension    Hypothyroidism    Pure hypercholesterolemia 05/11/2019   Past Surgical History:  Procedure Laterality Date   DILATION AND CURETTAGE, DIAGNOSTIC / THERAPEUTIC  2002   Radioiodine ablation     TUBAL LIGATION     Social History   Socioeconomic History   Marital status: Married    Spouse name: Not on file   Number of children: Not on file   Years of education: Not on file   Highest education level: GED or equivalent  Occupational History   Not on file  Tobacco Use   Smoking status: Never   Smokeless tobacco: Never  Vaping Use   Vaping status: Never Used  Substance and Sexual Activity   Alcohol use: Never   Drug use: Never   Sexual activity: Yes  Other Topics Concern   Not on file  Social History Narrative   Not on file   Social Drivers of Health   Financial Resource Strain: High Risk (08/03/2023)   Overall Financial Resource Strain (CARDIA)    Difficulty of Paying Living Expenses: Very hard  Food Insecurity: Unknown (08/03/2023)   Hunger Vital Sign    Worried About Running Out of Food in the Last Year: Never true    Ran Out of Food in the Last Year: Patient declined  Transportation Needs: No Transportation Needs (08/03/2023)   PRAPARE - Administrator, Civil Service (Medical): No    Lack of Transportation (Non-Medical): No  Physical Activity: Unknown (08/03/2023)   Exercise Vital Sign    Days of Exercise per Week: 0 days    Minutes of Exercise per Session: Not on file  Stress: No Stress Concern Present (08/03/2023)   [employer] of Occupational Health - Occupational Stress Questionnaire    Feeling of Stress : Not at all  Social Connections: Moderately Integrated (08/03/2023)   Social Connection and Isolation Panel [NHANES]    Frequency of Communication with Friends and Family: Twice a week    Frequency  of Social Gatherings with Friends and Family: Twice a week    Attends Religious Services: More than 4 times per year    Active Member of [employer] or Organizations: No    Attends Banker Meetings: Not on file    Marital Status: Married  Intimate Partner Violence: Not At Risk (08/06/2023)   Humiliation, Afraid,  Rape, and Kick questionnaire    Fear of Current or Ex-Partner: No    Emotionally Abused: No    Physically Abused: No    Sexually Abused: No   Current Outpatient Medications on File Prior to Visit  Medication Sig Dispense Refill   amLODipine  (NORVASC ) 10 MG tablet Take 1 tablet (10 mg total) by mouth daily. 90 tablet 1   atorvastatin  (LIPITOR) 80 MG tablet Take 1 tablet (80 mg total) by mouth daily at 6 PM. 90 tablet 1   Blood Glucose Monitoring Suppl (FREESTYLE LITE) w/Device KIT Use as instructed to test blood sugar 2 times daily, in the morning and the evening 1 kit 0   glucose blood test strip Use as instructed. Check blood glucose level by fingerstick three times per day. 200 each 12   Insulin  Glargine (BASAGLAR  KWIKPEN) 100 UNIT/ML Inject 45 Units into the skin at bedtime. 30 mL 3   insulin  lispro (HUMALOG  KWIKPEN) 200 UNIT/ML KwikPen Inject 8-12 Units into the skin 3 (three) times daily. 30 mL 3   Insulin  Pen Needle 32G X 4 MM MISC Use 4 (four) times daily. 300 each 3   Insulin  Syringe-Needle U-100 (INSULIN  SYRINGES) 31G X 5/16 0.5 ML MISC Use as instructed. Inject into the skin once nightly. 100 each 6   Lancets (FREESTYLE) lancets Use as instructed 100 each 12   levothyroxine  (SYNTHROID ) 137 MCG tablet Take 1 tablet (137 mcg total) by mouth once daily before breakfast. 60 tablet 2   losartan -hydrochlorothiazide  (HYZAAR ) 100-25 MG tablet Take 1 tablet by mouth daily. 90 tablet 2   metFORMIN  (GLUCOPHAGE ) 1000 MG tablet Take 1 tablet (1,000 mg total) by mouth 2 (two) times daily with a meal. 180 tablet 3   OneTouch Delica Lancets 33G MISC Use as instructed to check blood  sugar up to 3 times daily. E11.65 100 each 11   Semaglutide ,0.25 or 0.5MG /DOS, 2 MG/3ML SOPN Inject 0.5 mg into the skin once a week. 9 mL 3   No current facility-administered medications on file prior to visit.   No Known Allergies Family History  Problem Relation Age of Onset   Cancer Mother    Thyroid  disease Mother    Heart attack Maternal Grandfather    Heart attack Maternal Grandmother    PE: BP 120/70   Pulse 96   Ht 6' 1 (1.854 m)   Wt 199 lb 9.6 oz (90.5 kg)   LMP 12/05/2012   SpO2 96%   BMI 26.33 kg/m  Wt Readings from Last 3 Encounters:  09/10/23 199 lb 9.6 oz (90.5 kg)  08/06/23 202 lb 9.6 oz (91.9 kg)  05/06/23 204 lb 12.8 oz (92.9 kg)   Constitutional: Slightly overweight, in NAD Eyes:  EOMI, no exophthalmos ENT: no neck masses, no cervical lymphadenopathy Cardiovascular: RRR, No MRG Respiratory: CTA B Musculoskeletal: no deformities except enlarged left clavicle head Skin:no rashes Neurological: no tremor with outstretched hands  ASSESSMENT: 1.  Postablation hypothyroidism  2.  Type 2 diabetes, uncontrolled, with complications - PN  PLAN:  1. Patient with longstanding hypothyroidism, on levothyroxine  therapy - latest thyroid  labs reviewed with pt. >> normal: Lab Results  Component Value Date   TSH 3.26 10/26/2022  - she continues on LT4 137 mcg daily - pt feels good on this dose. - we discussed about taking the thyroid  hormone every day, with water, >30 minutes before breakfast, separated by >4 hours from acid reflux medications, calcium , iron, multivitamins. Pt. is taking it correctly. - will check thyroid   tests at next visit  2.  Uncontrolled type 2 diabetes -Patient with uncontrolled type 2 diabetes, on basal-bolus insulin  regimen along with metformin , to which I suggested addition of a GLP-1 receptor agonist at last visit.  HbA1c before last visit was 9.6%, slightly lower, but she had another HbA1c obtained last month and this was increased  again at 10.8%.  Sugars are still above target, with the lowest being at 98 and the highest in the 300s.  There was significant fluctuation in the blood sugars.  Upon questioning, she was using lower doses of Humalog  than recommended.  I did advise her to increase these and to change the dose based on the size of the meal as she was taking a fixed dose.  I also recommended to try to start Ozempic  after discussion of benefits and possible side effects.  -At today's visit, she tells me that she was not able to start Ozempic .  She did not have the money to purchase this from the pharmacy.  However, she feels that the insurance covers it.  At today's visit, sugars are quite high and we discussed that this could be due to taking lower than recommended doses of Humalog  and also missing doses.  I advised her to increase the dose of Humalog  and take it consistently 15 minutes before every meal.  We also gave her a sample pen of Ozempic  and I advised her to start at a low dose and increase as tolerated.  Will continue the rest of the regimen for now. -I advised her to: Patient Instructions  Please continue Levothyroxine  137 mcg daily.  Take the thyroid  hormone every day, with water, at least 30 minutes before breakfast, separated by at least 4 hours from: - acid reflux medications - calcium  - iron - multivitamins  Please continue: - Metformin  1000 mg 2x a day with meals - Basaglar  45 units at night  Increase: - Humalog  14-18 units 15 min before the 3 main meals  Start: - Ozempic  0.25 mg weekly in a.m. x2, then increase the dose 0.5 mg weekly  You need a new eye exam.  Please return in 1.5 months.  - advised to check sugars at different times of the day - 4x a day, rotating check times - advised for yearly eye exams >> she is not UTD - return to clinic in 3 months  She has an enlarged left clavicle head-discussed about letting PCP know about it to see if she needed an x-ray.  She has an  appointment coming up.  Lela Fendt, MD PhD [facility]

## 2023-09-10 NOTE — Patient Instructions (Addendum)
 Please continue Levothyroxine  137 mcg daily.  Take the thyroid  hormone every day, with water, at least 30 minutes before breakfast, separated by at least 4 hours from: - acid reflux medications - calcium  - iron - multivitamins  Please continue: - Metformin  1000 mg 2x a day with meals - Basaglar  45 units at night  Increase: - Humalog  14-18 units 15 min before the 3 main meals  Start: - Ozempic  0.25 mg weekly in a.m. x2, then increase the dose 0.5 mg weekly  You need a new eye exam.  Please return in 1.5 months.

## 2023-09-19 ENCOUNTER — Ambulatory Visit (HOSPITAL_COMMUNITY)
Admission: EM | Admit: 2023-09-19 | Discharge: 2023-09-19 | Disposition: A | Discharge status: Home / Self Care | Payer: 59 | Attending: Neurology | Admitting: Neurology

## 2023-09-19 ENCOUNTER — Ambulatory Visit (INDEPENDENT_AMBULATORY_CARE_PROVIDER_SITE_OTHER): Payer: 59

## 2023-09-19 ENCOUNTER — Encounter (HOSPITAL_COMMUNITY): Payer: Self-pay

## 2023-09-19 DIAGNOSIS — J069 Acute upper respiratory infection, unspecified: Secondary | ICD-10-CM | POA: Diagnosis not present

## 2023-09-19 DIAGNOSIS — R062 Wheezing: Secondary | ICD-10-CM

## 2023-09-19 DIAGNOSIS — R069 Unspecified abnormalities of breathing: Secondary | ICD-10-CM

## 2023-09-19 DIAGNOSIS — R058 Other specified cough: Secondary | ICD-10-CM | POA: Diagnosis not present

## 2023-09-19 MED ORDER — AZITHROMYCIN 250 MG PO TABS: 250.0000 mg | ORAL_TABLET | Freq: Every day | ORAL | 0 refills | Status: DC

## 2023-09-19 MED ORDER — BENZONATATE 100 MG PO CAPS: 100.0000 mg | ORAL_CAPSULE | Freq: Three times a day (TID) | ORAL | 0 refills | Status: DC

## 2023-09-19 MED ORDER — ALBUTEROL SULFATE HFA 108 (90 BASE) MCG/ACT IN AERS: 1.0000 | INHALATION_SPRAY | Freq: Four times a day (QID) | RESPIRATORY_TRACT | 0 refills | Status: DC | PRN

## 2023-09-19 MED ORDER — PROMETHAZINE-DM 6.25-15 MG/5ML PO SYRP: 5.0000 mL | ORAL_SOLUTION | Freq: Four times a day (QID) | ORAL | 0 refills | Status: DC | PRN

## 2023-09-19 NOTE — Discharge Instructions (Addendum)
Your evaluation shows you likely have an infection plus a viral infection of the upper airways of your lungs. Use the following medicines to help with your symptoms:  - Take antibiotic sent to pharmacy as directed to treat sinus infection. - You may use albuterol inhaler 1 to 2 puffs every 4-6 hours as needed for cough, shortness of breath, and wheezing. - Tessalon perles every 8 hours as needed for cough. - Take Promethazine DM cough medication to help with your cough at nighttime so that you are able to sleep. Do not drive, drink alcohol, or go to work while taking this medication since it can make you sleepy. Only take this at nighttime.  - Purchase Mucinex over the counter and take this every 12 hours as needed for nasal congestion and cough.  If you develop any new or worsening symptoms or do not improve in the next 2 to 3 days, please return.  If your symptoms are severe, please go to the emergency room.  Follow-up with your primary care provider for further evaluation and management of your symptoms as well as ongoing wellness visits.  I hope you feel better!

## 2023-09-19 NOTE — ED Triage Notes (Signed)
Patient here today with c/o productive cough, SOB, body aches, headache, ST, and hot flashes since Monday. She has been taking Coricidin and an OTC ST medicine with some relief. Her grandchild had pneumonia.

## 2023-09-19 NOTE — ED Provider Notes (Signed)
[facility]    CSN: [id number] Arrival date & time: 09/19/23  1348      History   Chief Complaint Chief Complaint  Patient presents with   Cough    HPI Diana Fleming is a 58 y.o. female.    Started feeling achy, chills, cough on Tuesday or Wednesday.  Of note her granddaughter was sick and was treated for pneumonia.  Diana Fleming has been using cough medicine over-the-counter.  She does believe she was febrile however she was not able to take her temperature.  Cough has been nonproductive dry.  Not necessarily worse at night.  She has been eating and drinking okay, denies nausea vomiting.  She does feel short of breath with exertion.   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      Home Medications    Prior to Admission medications   Medication Sig Start Date End Date Taking? Authorizing Provider  albuterol (VENTOLIN HFA) 108 (90 Base) MCG/ACT inhaler Inhale 1-2 puffs into the lungs every 6 (six) hours as needed for wheezing or shortness of breath. 09/19/23  Yes Mars Scheaffer, Ludger Nutting, NP  azithromycin (ZITHROMAX) 250 MG tablet Take 1 tablet (250 mg total) by mouth daily. Take first 2 tablets together, then 1 every day until  finished. 09/19/23  Yes Lisa Blakeman, Ludger Nutting, NP  benzonatate (TESSALON) 100 MG capsule Take 1 capsule (100 mg total) by mouth every 8 (eight) hours. 09/19/23  Yes Elmer Picker, NP  promethazine-dextromethorphan (PROMETHAZINE-DM) 6.25-15 MG/5ML syrup Take 5 mLs by mouth 4 (four) times daily as needed for cough. 09/19/23  Yes Jadore Mcguffin, Ludger Nutting, NP  amLODipine (NORVASC) 10 MG tablet Take 1 tablet (10 mg total) by mouth daily. 01/20/23   Claiborne Rigg, NP  atorvastatin (LIPITOR) 80 MG tablet Take 1 tablet (80 mg total) by mouth daily at 6 PM. 08/06/23   Claiborne Rigg, NP  Blood Glucose Monitoring Suppl (FREESTYLE LITE) w/Device KIT Use as instructed to test blood sugar 2 times daily, in the morning and the evening 12/17/20   Claiborne Rigg, NP  glucose blood test strip Use as instructed. Check blood glucose level by fingerstick three times per day. 07/20/22   Claiborne Rigg, NP  Insulin Glargine (BASAGLAR KWIKPEN) 100 UNIT/ML Inject 45 Units into the skin at bedtime. 10/26/22   Carlus Pavlov, MD  insulin lispro (HUMALOG KWIKPEN) 200 UNIT/ML KwikPen Inject 8-12 Units into the skin 3 (three) times daily. 09/01/22   Carlus Pavlov, MD  Insulin Pen Needle 32G X 4 MM MISC Use 4 (four) times daily. 09/01/22   Carlus Pavlov, MD  Insulin Syringe-Needle U-100 (INSULIN SYRINGES) 31G X 5/16" 0.5 ML MISC Use as instructed. Inject  into the skin once nightly. 01/17/22   Claiborne Rigg, NP  Lancets (FREESTYLE) lancets Use as instructed 01/09/22   Hoy Register, MD  levothyroxine (SYNTHROID) 137 MCG tablet Take 1 tablet (137 mcg total) by mouth once daily before breakfast. 04/09/23     losartan-hydrochlorothiazide (HYZAAR) 100-25 MG tablet Take 1 tablet by mouth daily. 08/06/23   Claiborne Rigg, NP  metFORMIN (GLUCOPHAGE) 1000 MG tablet Take 1 tablet (1,000 mg total) by mouth 2 (two) times daily with a meal. 09/01/22   Carlus Pavlov, MD  OneTouch Delica Lancets 33G MISC Use as instructed to check blood sugar up to 3  times daily. E11.65 11/24/19   Hoy Register, MD  Semaglutide,0.25 or 0.5MG /DOS, 2 MG/3ML SOPN Inject 0.5 mg into the skin once a week. Patient not taking: Reported on 09/10/2023 05/06/23   Carlus Pavlov, MD    Family History Family History  Problem Relation Age of Onset   Cancer Mother    Thyroid disease Mother    Heart attack Maternal Grandfather    Heart attack Maternal Grandmother     Social History Social History   Tobacco Use   Smoking status: Never   Smokeless tobacco: Never  Vaping Use   Vaping status: Never Used  Substance Use Topics   Alcohol use: Never   Drug use: Never     Allergies   Patient has no known allergies.   Review of Systems Review of Systems  Respiratory:  Positive for cough.      Physical Exam Triage Vital Signs ED Triage Vitals  Encounter Vitals Group     BP 09/19/23 1454 131/86     Systolic BP Percentile --      Diastolic BP Percentile --      Pulse Rate 09/19/23 1454 (!) 102     Resp 09/19/23 1454 16     Temp 09/19/23 1454 98.3 F (36.8 C)     Temp Source 09/19/23 1454 Oral     SpO2 09/19/23 1454 96 %     Weight 09/19/23 1448 200 lb (90.7 kg)     Height 09/19/23 1448 6\' 1"  (1.854 m)     Head Circumference --      Peak Flow --      Pain Score 09/19/23 1451 5     Pain Loc --      Pain Education --      Exclude from Growth Chart --    No data found.  Updated Vital Signs BP 131/86 (BP Location: Left Arm)   Pulse (!) 102   Temp 98.3 F (36.8 C) (Oral)   Resp 16   Ht 6\' 1"  (1.854 m)   Wt 200 lb (90.7 kg)   LMP 12/05/2012   SpO2 96%   BMI 26.39 kg/m   Visual Acuity Right Eye Distance:   Left Eye Distance:   Bilateral Distance:    Right Eye Near:   Left Eye Near:    Bilateral Near:     Physical Exam Constitutional:      Appearance: Normal appearance.  HENT:     Right Ear: Tympanic membrane normal.     Left Ear: Tympanic membrane normal.     Nose: Congestion present.  Eyes:     Conjunctiva/sclera:  Conjunctivae normal.  Cardiovascular:     Rate and Rhythm: Regular rhythm. Tachycardia present.  Pulmonary:     Breath sounds: Examination of the left-middle field reveals wheezing. Wheezing and rhonchi present.  Abdominal:     General:  Abdomen is flat.     Palpations: Abdomen is soft.  Musculoskeletal:     Cervical back: Normal range of motion and neck supple.  Neurological:     Mental Status: She is alert.      UC Treatments / Results  Labs (all labs ordered are listed, but only abnormal results are displayed) Labs Reviewed - No data to display  EKG   Radiology DG Chest 2 View Result Date: 09/19/2023 CLINICAL DATA:  Wheezing, productive cough EXAM: CHEST - 2 VIEW COMPARISON:  01/31/2018 FINDINGS: Frontal and lateral views of the chest demonstrate an unremarkable cardiac silhouette. No acute airspace disease, effusion, or pneumothorax. No acute bony abnormalities. IMPRESSION: 1. No acute intrathoracic process. Electronically Signed   By: Sharlet Salina M.D.   On: 09/19/2023 16:06    Procedures Procedures (including critical care time)  Medications Ordered in UC Medications - No data to display  Initial Impression / Assessment and Plan / UC Course  I have reviewed the triage vital signs and the nursing notes.  Pertinent labs & imaging results that were available during my care of the patient were reviewed by me and considered in my medical decision making (see chart for details).  Chest imaging: Read as no acute abnormality.   High clinical suspicion for CAP to the left lower lung with underlying bronchitis given length of symptoms and physical exam findings.  Antibiotic(s), bronchodilator, and supportive care ordered/recommended and outlined in AVS.    Final Clinical Impressions(s) / UC Diagnoses   Final diagnoses:  Wheezing  Respiratory abnormalities  Viral upper respiratory tract infection     Discharge Instructions      Your evaluation shows you likely  have an infection plus a viral infection of the upper airways of your lungs. Use the following medicines to help with your symptoms:  - Take antibiotic sent to pharmacy as directed to treat sinus infection. - You may use albuterol inhaler 1 to 2 puffs every 4-6 hours as needed for cough, shortness of breath, and wheezing. - Tessalon perles every 8 hours as needed for cough. - Take Promethazine DM cough medication to help with your cough at nighttime so that you are able to sleep. Do not drive, drink alcohol, or go to work while taking this medication since it can make you sleepy. Only take this at nighttime.  - Purchase Mucinex over the counter and take this every 12 hours as needed for nasal congestion and cough.  If you develop any new or worsening symptoms or do not improve in the next 2 to 3 days, please return.  If your symptoms are severe, please go to the emergency room.  Follow-up with your primary care provider for further evaluation and management of your symptoms as well as ongoing wellness visits.  I hope you feel better!         ED Prescriptions     Medication Sig Dispense Auth. Provider   azithromycin (ZITHROMAX) 250 MG tablet Take 1 tablet (250 mg total) by mouth daily. Take first 2 tablets together, then 1 every day until finished. 6 tablet Elmer Picker, NP   albuterol (VENTOLIN HFA) 108 (90 Base) MCG/ACT inhaler Inhale 1-2 puffs into the lungs every 6 (six) hours as needed for wheezing or shortness of breath. 18 g Elmer Picker, NP   benzonatate (TESSALON) 100 MG capsule Take 1 capsule (100 mg total) by mouth every 8 (eight) hours. 21 capsule Elmer Picker, NP   promethazine-dextromethorphan (PROMETHAZINE-DM) 6.25-15 MG/5ML syrup Take  5 mLs by mouth 4 (four) times daily as needed for cough. 118 mL Elmer Picker, NP      PDMP not reviewed this encounter.   Elmer Picker, NP 09/19/23 1701

## 2023-09-22 ENCOUNTER — Telehealth: Payer: Self-pay

## 2023-09-22 NOTE — Telephone Encounter (Signed)
Patient contacted to schedule appointment for Follow-up A1C,.  Appointment for 01/30//2025.  patient to bring BS monitor and or log to appointment.

## 2023-09-29 NOTE — Progress Notes (Deleted)
 S:     No chief complaint on file.  58 y.o. female with PMHx significant for HTN, T2DM, hypothyroidism, and HLD who presents for diabetes evaluation, education, and management.   Patient was referred and last seen by Primary Care Provider, Dr. ***, on ***.  *** Patient was referred by *** on ***. Patient was last seen by Primary Care Provider, Dr. ***, on ***.   .  At last visit, ***.    Patient arrives in *** good spirits and presents without *** any assistance. ***Patient is accompanied by ***.    Patient reports Diabetes was diagnosed in ***.   Family/Social History: ***  Current diabetes medications include: *** Current hypertension medications include: *** Current hyperlipidemia medications include: ***  Patient reports adherence to taking all medications as prescribed.  *** Patient denies adherence with medications, reports missing *** medications *** times per week, on average.  Do you feel that your medications are working for you? {YES NO:22349} Have you been experiencing any side effects to the medications prescribed? {YES NO:22349} Do you have any problems obtaining medications due to transportation or finances? {YES [id number] Insurance coverage: ***  Patient {Actions; denies-reports:120008} hypoglycemic events.  Reported home fasting blood sugars: ***  Reported 2 hour post-meal/random blood sugars: ***.  Patient {Actions; denies-reports:120008} nocturia (nighttime urination).  Patient {Actions; denies-reports:120008} neuropathy (nerve pain). Patient {Actions; denies-reports:120008} visual changes. Patient {Actions; denies-reports:120008} self foot exams.   Patient reported dietary habits: Eats *** meals/day Breakfast: *** Lunch: *** Dinner: *** Snacks: *** Drinks: ***  Within the past 12 months, did you worry whether your food would run out before you got money to buy more? {YES NO:22349} Within the past 12 months, did the food you bought run out,  and you didn't have money to get more? {YES NO:22349} PHQ-9 Score: ***  Patient-reported exercise habits: ***   O:   ROS  Physical Exam  7 day average blood glucose: ***  Libre3 CGM Download today *** % Time CGM is active: ***% Average Glucose: *** mg/dL Glucose Management Indicator: ***  Glucose Variability: ***% (goal <36%) Time in Goal:  - Time in range 70-180: ***% - Time above range: ***% - Time below range: ***% Observed patterns:   Lab Results  Component Value Date   HGBA1C 10.8 (A) 08/06/2023   There were no vitals filed for this visit.  Lipid Panel     Component Value Date/Time   CHOL 121 05/06/2023 0904   CHOL 127 12/17/2021 0800   TRIG 153.0 (H) 05/06/2023 0904   HDL 37.30 (L) 05/06/2023 0904   HDL 48 12/17/2021 0800   CHOLHDL 3 05/06/2023 0904   VLDL 30.6 05/06/2023 0904   LDLCALC 53 05/06/2023 0904   LDLCALC 64 12/17/2021 0800    Clinical Atherosclerotic Cardiovascular Disease (ASCVD): {YES/NO:21197} The ASCVD Risk score (Arnett DK, et al., 2019) failed to calculate for the following reasons:   The valid total cholesterol range is 130 to 320 mg/dL   Patient is participating in a Managed Medicaid Plan:  {MM YES/NO:27447::"Yes"}   A/P: Diabetes longstanding *** currently ***. Patient is *** able to verbalize appropriate hypoglycemia management plan. Medication adherence appears ***. Control is suboptimal due to ***. -{Meds adjust:18428} basal insulin *** Lantus/Basaglar/Semglee (insulin glargine) *** Tresiba (insulin degludec) from *** units to *** units daily in the morning. Patient will continue to titrate 1 unit every *** days if fasting blood sugar > 100mg /dl until fasting blood sugars reach goal or next visit.  -{Meds  adjust:18428} rapid insulin *** Novolog (insulin aspart) *** Humalog (insulin lispro) from *** to ***.  -{Meds adjust:18428} GLP-1 *** Trulicity (dulaglutide) *** Ozempic (semaglutide) *** Mounjaro (tirzepatide) from *** mg to ***  mg .  -{Meds adjust:18428} SGLT2-I *** Farxiga (dapagliflozin) *** Jardiance (empagliflozin) 10 mg. Counseled on sick day rules. -{Meds adjust:18428} metformin ***.  -Patient educated on purpose, proper use, and potential adverse effects of ***.  -Extensively discussed pathophysiology of diabetes, recommended lifestyle interventions, dietary effects on blood sugar control.  -Counseled on s/sx of and management of hypoglycemia.  -Next A1c anticipated ***.   ASCVD risk - primary ***secondary prevention in patient with diabetes. Last LDL is *** not at goal of <16 *** mg/dL. ASCVD risk factors include *** and 10-year ASCVD risk score of ***. {Desc; low/moderate/high:110033} intensity statin indicated.  -{Meds adjust:18428} ***statin *** mg.   Hypertension longstanding *** currently ***. Blood pressure goal of <130/80 *** mmHg. Medication adherence ***. Blood pressure control is suboptimal due to ***. -{Meds adjust:18428} *** mg.  Written patient instructions provided. Patient verbalized understanding of treatment plan.  Total time in face to face counseling *** minutes.    Follow-up:  Pharmacist *** PCP clinic visit in *** Patient seen with ***

## 2023-09-30 ENCOUNTER — Ambulatory Visit: Payer: 59 | Admitting: Pharmacist

## 2023-10-05 ENCOUNTER — Ambulatory Visit: Payer: 59 | Admitting: Nurse Practitioner

## 2023-10-08 ENCOUNTER — Other Ambulatory Visit: Payer: Self-pay

## 2023-10-14 ENCOUNTER — Other Ambulatory Visit: Payer: Self-pay

## 2023-10-14 ENCOUNTER — Other Ambulatory Visit: Payer: Self-pay | Admitting: Internal Medicine

## 2023-10-14 DIAGNOSIS — E1165 Type 2 diabetes mellitus with hyperglycemia: Secondary | ICD-10-CM

## 2023-10-18 ENCOUNTER — Other Ambulatory Visit: Payer: Self-pay

## 2023-10-18 MED ORDER — HUMALOG KWIKPEN 200 UNIT/ML ~~LOC~~ SOPN
8.0000 [IU] | PEN_INJECTOR | Freq: Three times a day (TID) | SUBCUTANEOUS | 3 refills | Status: DC
  Filled 2023-10-18 – 2023-10-26 (×4): qty 30, 168d supply, fill #0

## 2023-10-19 ENCOUNTER — Other Ambulatory Visit: Payer: Self-pay

## 2023-10-20 ENCOUNTER — Other Ambulatory Visit: Payer: Self-pay

## 2023-10-21 NOTE — Progress Notes (Deleted)
 Patient ID: Diana Fleming, female   DOB: 02-27-1966, 58 y.o.   MRN: [id number]  HPI  Diana Fleming is a 58 y.o.-year-old female, returning for follow up postablative hypothyroidism and uncontrolled type 2 diabetes with PN.  She was previously seen by Dr. Everardo All who was managing her hypothyroidism.  However, last visit with me 4 months ago. Insurance: [employer] and [employer]  Interim history: No increased urination, blurry vision, nausea, no chest pain.  Pt. has been dx with hypothyroidism in 2010, after radioactive iodine ablation >> on Levothyroxine 137 mcg, increased 07/2022.  Pt takes the levothyroxine: - in am - fasting - at least 30 min from b'fast - no calcium - no iron - no multivitamins - no PPIs - not on Biotin  I reviewed pt's thyroid tests: Lab Results  Component Value Date   TSH 3.26 10/26/2022   TSH 7.17 (H) 08/21/2022   TSH 4.87 06/03/2022   TSH 0.50 11/06/2021   TSH 0.59 05/09/2021   TSH 0.017 (L) 02/05/2021   TSH 0.377 (L) 12/25/2020   TSH 0.045 (L) 09/18/2020   TSH 1.670 01/31/2020   TSH 3.200 03/20/2019   FREET4 1.01 10/26/2022   FREET4 1.35 11/06/2021   FREET4 1.04 05/09/2021   FREET4 1.55 12/25/2020   Antithyroid antibodies: No results found for: "THGAB" No components found for: "TPOAB"  Pt denies feeling nodules in neck, hoarseness, dysphagia/odynophagia.  She has + FH of thyroid disorders in: mother. No FH of thyroid cancer.  No h/o radiation tx to head or neck exc. RAI tx. No recent use of iodine supplements.  Uncontrolled type 2 diabetes:  Reviewed HbA1c: Lab Results  Component Value Date   HGBA1C 10.8 (A) 08/06/2023   HGBA1C 9.6 (A) 04/26/2023   HGBA1C 10.8 (A) 01/11/2023   HGBA1C 11.3 (A) 10/26/2022   HGBA1C 12.0 (H) 08/20/2022   HGBA1C 10.6 (H) 06/03/2022   HGBA1C 9.4 (A) 04/17/2022   HGBA1C 9.7 (H) 01/09/2022   HGBA1C 8.5 (A) 10/17/2021   HGBA1C 7.1 (A) 07/11/2021   Prev. on: - Metformin 1000 mg 2x a day, with meals >> 1x a  day - Farxiga 10 mg in a.m. >> stopped b/c not covered - Ozempic 0.5 mg weekly >> stopped b/c not covered - Semglee 30 >> 45 units at bedtime,  increased 07/2022  Now on: - Metformin 1000 mg 2x a day with meals - Semglee 45 units at night >> off for ~2 mo >> restarted Basaglar 30 >> 45 units daily - Humalog 8-12 >> 15-18 units >> still using 12 units 15 min before the 3 main meals >> 14-18 units 15 min before the 3 main meals (actually using 12) >> 14-18 units 15 min before the 3 main meals - Ozempic 0.25 >> 0.5 mg weekly in a.m.   Pt checks her sugars 0-1x a day and they are-per review of her meter download: - am: 208-255, 304 >> 182-255 >> 95, 172-209, 275 >> n/c - 2h after b'fast: 208 >> 239 >> 258, 330 >> 254 >> 373 - before lunch: n/c >> 116-241 - 2h after lunch: 225, 292, 426 >> n/c >> 320, 345 >> 341 - before dinner: n/c >> 151, 175 >> n/c - 2h after dinner: 248-393 >> 412 >> 389, 399 >> 320, 349 >> n/c - bedtime: n/c - nighttime: n/c Lowest sugar was 208 >> 95 >> 140 Highest sugar was 426 >> 349 >> 373.  Glucometer: One Touch >> [employer]  She tries to stay away from  fried food, but she works in a [employer] and is not always able to do so.  - no CKD, last BUN/creatinine:  Lab Results  Component Value Date   BUN 10 08/06/2023   BUN 8 04/26/2023   CREATININE 0.82 08/06/2023   CREATININE 0.76 04/26/2023   Lab Results  Component Value Date   MICRALBCREAT 4.0 05/06/2023   MICRALBCREAT 1.8 06/03/2022   MICRALBCREAT 13 10/16/2020   - + HL; last set of lipids: Lab Results  Component Value Date   CHOL 121 05/06/2023   HDL 37.30 (L) 05/06/2023   LDLCALC 53 05/06/2023   TRIG 153.0 (H) 05/06/2023   CHOLHDL 3 05/06/2023  On Lipitor 80 mg daily  - last eye exam was in 2022. No DR reportedly.   - + numbness and tingling in her feet.  Last foot exam 10/26/2022.  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    Hypertension    Hypothyroidism    Pure hypercholesterolemia 05/11/2019   Past Surgical History:  Procedure Laterality Date   DILATION AND CURETTAGE, DIAGNOSTIC / THERAPEUTIC  2002   Radioiodine ablation     TUBAL LIGATION     Social History   Socioeconomic History   Marital status: Married    Spouse name: Not on file   Number of children: Not on file   Years of education: Not on file   Highest education level: GED or equivalent  Occupational History   Not on file  Tobacco Use   Smoking status: Never   Smokeless tobacco: Never  Vaping Use   Vaping status: Never Used  Substance and Sexual Activity   Alcohol use: Never   Drug use: Never   Sexual activity: Yes  Other Topics Concern   Not on file  Social History Narrative   Not on file   Social Drivers of Health   Financial Resource Strain: High Risk (08/03/2023)   Overall Financial Resource Strain (CARDIA)    Difficulty of Paying Living Expenses: Very hard  Food Insecurity: Unknown (08/03/2023)   Hunger Vital Sign    Worried About Running Out of Food in the Last Year: Never true    Ran Out of Food in the Last Year: Patient declined  Transportation Needs: No Transportation Needs (08/03/2023)   PRAPARE - Administrator, Civil Service (Medical): No    Lack of Transportation (Non-Medical): No  Physical Activity: Unknown (08/03/2023)   Exercise Vital Sign    Days of Exercise per Week: 0 days    Minutes of Exercise per Session: Not on file  Stress: No Stress Concern Present (08/03/2023)   [employer] of Occupational Health - Occupational Stress Questionnaire    Feeling of Stress : Not at all  Social Connections: Moderately Integrated (08/03/2023)   Social Connection and Isolation Panel [NHANES]    Frequency of Communication with Friends and Family: Twice a week    Frequency of Social  Gatherings with Friends and Family: Twice a week    Attends Religious Services: More than 4 times per year    Active Member of [employer] or Organizations: No    Attends Banker Meetings: Not on file    Marital Status: Married  Intimate Partner Violence: Not At Risk (08/06/2023)   Humiliation, Afraid, Rape, and  Kick questionnaire    Fear of Current or Ex-Partner: No    Emotionally Abused: No    Physically Abused: No    Sexually Abused: No   Current Outpatient Medications on File Prior to Visit  Medication Sig Dispense Refill   albuterol (VENTOLIN HFA) 108 (90 Base) MCG/ACT inhaler Inhale 1-2 puffs into the lungs every 6 (six) hours as needed for wheezing or shortness of breath. 18 g 0   amLODipine (NORVASC) 10 MG tablet Take 1 tablet (10 mg total) by mouth daily. 90 tablet 1   atorvastatin (LIPITOR) 80 MG tablet Take 1 tablet (80 mg total) by mouth daily at 6 PM. 90 tablet 1   azithromycin (ZITHROMAX) 250 MG tablet Take 1 tablet (250 mg total) by mouth daily. Take first 2 tablets together, then 1 every day until finished. 6 tablet 0   benzonatate (TESSALON) 100 MG capsule Take 1 capsule (100 mg total) by mouth every 8 (eight) hours. 21 capsule 0   Blood Glucose Monitoring Suppl (FREESTYLE LITE) w/Device KIT Use as instructed to test blood sugar 2 times daily, in the morning and the evening 1 kit 0   glucose blood test strip Use as instructed. Check blood glucose level by fingerstick three times per day. 200 each 12   Insulin Glargine (BASAGLAR KWIKPEN) 100 UNIT/ML Inject 45 Units into the skin at bedtime. 30 mL 3   insulin lispro (HUMALOG KWIKPEN) 200 UNIT/ML KwikPen Inject 8-12 Units into the skin 3 (three) times daily. 30 mL 3   Insulin Pen Needle 32G X 4 MM MISC Use 4 (four) times daily. 300 each 3   Insulin Syringe-Needle U-100 (INSULIN SYRINGES) 31G X 5/16" 0.5 ML MISC Use as instructed. Inject into the skin once nightly. 100 each 6   Lancets (FREESTYLE) lancets Use as instructed  100 each 12   levothyroxine (SYNTHROID) 137 MCG tablet Take 1 tablet (137 mcg total) by mouth once daily before breakfast. 60 tablet 2   losartan-hydrochlorothiazide (HYZAAR) 100-25 MG tablet Take 1 tablet by mouth daily. 90 tablet 2   metFORMIN (GLUCOPHAGE) 1000 MG tablet Take 1 tablet (1,000 mg total) by mouth 2 (two) times daily with a meal. 180 tablet 3   OneTouch Delica Lancets 33G MISC Use as instructed to check blood sugar up to 3 times daily. E11.65 100 each 11   promethazine-dextromethorphan (PROMETHAZINE-DM) 6.25-15 MG/5ML syrup Take 5 mLs by mouth 4 (four) times daily as needed for cough. 118 mL 0   Semaglutide,0.25 or 0.5MG /DOS, 2 MG/3ML SOPN Inject 0.5 mg into the skin once a week. (Patient not taking: Reported on 09/10/2023) 9 mL 3   No current facility-administered medications on file prior to visit.   No Known Allergies Family History  Problem Relation Age of Onset   Cancer Mother    Thyroid disease Mother    Heart attack Maternal Grandfather    Heart attack Maternal Grandmother    PE: LMP 12/05/2012  Wt Readings from Last 3 Encounters:  09/19/23 200 lb (90.7 kg)  09/10/23 199 lb 9.6 oz (90.5 kg)  08/06/23 202 lb 9.6 oz (91.9 kg)   Constitutional: Slightly overweight, in NAD Eyes:  EOMI, no exophthalmos ENT: no neck masses, no cervical lymphadenopathy Cardiovascular: RRR, No MRG Respiratory: CTA B Musculoskeletal: no deformities except enlarged left clavicle head Skin:no rashes Neurological: no tremor with outstretched hands  ASSESSMENT: 1.  Postablation hypothyroidism  2.  Type 2 diabetes, uncontrolled, with complications - PN  PLAN:  1. Patient with longstanding hypothyroidism,  on levothyroxine therapy - latest thyroid labs reviewed with pt. >> normal: Lab Results  Component Value Date   TSH 3.26 10/26/2022  - she continues on LT4 137 mcg daily - pt feels good on this dose. - we discussed about taking the thyroid hormone every day, with water, >30  minutes before breakfast, separated by >4 hours from acid reflux medications, calcium, iron, multivitamins. Pt. is taking it correctly. - will check thyroid tests today: TSH and fT4 - If labs are abnormal, she will need to return for repeat TFTs in 1.5 months  2.  Uncontrolled type 2 diabetes -Patient with uncontrolled type 2 diabetes, basal large bolus insulin regimen, adjusted at last visit.  She is also on metformin and GLP-1 receptor agonist.  At last visit she was not able to start Ozempic but she was planning to get it, as she felt that insurance started to cover it.  We gave her a pen of Ozempic to start this soon as possible.  She was also missing doses of Humalog and taking lower doses than recommended.  I advised her to increase the dose and take it 15 minutes before each meal.  HbA1c at last visit was very high, at 10.8%. -I advised her to: Patient Instructions  Please continue Levothyroxine 137 mcg daily.  Take the thyroid hormone every day, with water, at least 30 minutes before breakfast, separated by at least 4 hours from: - acid reflux medications - calcium - iron - multivitamins  Please continue: - Metformin 1000 mg 2x a day with meals - Basaglar 45 units at night - Humalog 14-18 units 15 min before the 3 main meals - Ozempic 0.5 mg weekly  You need a new eye exam.  Please return in 3-4 months.  - we checked her HbA1c: 7%  - advised to check sugars at different times of the day - 1x a day, rotating check times - advised for yearly eye exams >> she is not  UTD.  I again advised her to schedule this. - return to clinic in 3-4 months  Carlus Pavlov, MD PhD [facility]

## 2023-10-22 ENCOUNTER — Ambulatory Visit: Payer: 59 | Admitting: Internal Medicine

## 2023-10-26 ENCOUNTER — Other Ambulatory Visit: Payer: Self-pay

## 2023-10-27 ENCOUNTER — Other Ambulatory Visit: Payer: Self-pay

## 2023-10-29 ENCOUNTER — Other Ambulatory Visit: Payer: Self-pay

## 2023-11-01 ENCOUNTER — Other Ambulatory Visit: Payer: Self-pay

## 2023-11-01 MED ORDER — TRULICITY 0.75 MG/0.5ML ~~LOC~~ SOAJ
0.7500 mg | SUBCUTANEOUS | 0 refills | Status: DC
  Filled 2023-11-01: qty 2, 28d supply, fill #0

## 2023-11-04 ENCOUNTER — Other Ambulatory Visit: Payer: Self-pay

## 2023-11-04 ENCOUNTER — Ambulatory Visit (INDEPENDENT_AMBULATORY_CARE_PROVIDER_SITE_OTHER): Payer: 59 | Admitting: Internal Medicine

## 2023-11-04 ENCOUNTER — Encounter: Payer: Self-pay | Admitting: Internal Medicine

## 2023-11-04 VITALS — BP 120/70 | HR 99 | Ht 73.0 in | Wt 192.0 lb

## 2023-11-04 DIAGNOSIS — Z7984 Long term (current) use of oral hypoglycemic drugs: Secondary | ICD-10-CM | POA: Diagnosis not present

## 2023-11-04 DIAGNOSIS — E039 Hypothyroidism, unspecified: Secondary | ICD-10-CM

## 2023-11-04 DIAGNOSIS — E1165 Type 2 diabetes mellitus with hyperglycemia: Secondary | ICD-10-CM

## 2023-11-04 DIAGNOSIS — E1142 Type 2 diabetes mellitus with diabetic polyneuropathy: Secondary | ICD-10-CM | POA: Diagnosis not present

## 2023-11-04 DIAGNOSIS — Z794 Long term (current) use of insulin: Secondary | ICD-10-CM

## 2023-11-04 DIAGNOSIS — Z7985 Long-term (current) use of injectable non-insulin antidiabetic drugs: Secondary | ICD-10-CM

## 2023-11-04 LAB — HEMOGLOBIN A1C: Hemoglobin A1C: 10.4

## 2023-11-04 MED ORDER — HUMALOG KWIKPEN 200 UNIT/ML ~~LOC~~ SOPN
8.0000 [IU] | PEN_INJECTOR | Freq: Three times a day (TID) | SUBCUTANEOUS | 3 refills | Status: DC
  Filled 2023-11-04: qty 6, 30d supply, fill #0

## 2023-11-04 MED ORDER — BASAGLAR KWIKPEN 100 UNIT/ML ~~LOC~~ SOPN
45.0000 [IU] | PEN_INJECTOR | Freq: Every day | SUBCUTANEOUS | 3 refills | Status: DC
  Filled 2023-11-04: qty 30, 66d supply, fill #0

## 2023-11-04 NOTE — Progress Notes (Addendum)
 Patient ID: [name], female   DOB: [birth year], 58 y.o.   MRN: [id number] This note was precharted 10/22/2023.  HPI  Diana Fleming is a 58 y.o.-year-old female, returning for follow up postablative hypothyroidism and uncontrolled type 2 diabetes with PN.  She was previously seen by Dr. Everardo All who was managing her hypothyroidism.  However, last visit with me 4 months ago. Insurance: [employer] education [employer] and [employer]  Interim history: No increased urination, blurry vision, nausea, no chest pain. She is now out of job, no insurance  - plans to get [employer] next month but is not sure if she can obtain it.  Hypothyroidis: - dx'ed in 2010, after radioactive iodine ablation >> on generic levothyroxine.  Pt takes the levothyroxine 137 mcg daily, increased 07/2022: - in am - fasting - at least 30 min from b'fast - no calcium - no iron - + multivitamins >4h later - no PPIs - not on Biotin  I reviewed pt's thyroid tests: Lab Results  Component Value Date   TSH 3.26 10/26/2022   TSH 7.17 (H) 08/21/2022   TSH 4.87 06/03/2022   TSH 0.50 11/06/2021   TSH 0.59 05/09/2021   TSH 0.017 (L) 02/05/2021   TSH 0.377 (L) 12/25/2020   TSH 0.045 (L) 09/18/2020   TSH 1.670 01/31/2020   TSH 3.200 03/20/2019   FREET4 1.01 10/26/2022   FREET4 1.35 11/06/2021   FREET4 1.04 05/09/2021   FREET4 1.55 12/25/2020   Antithyroid antibodies: No results found for: "THGAB" No components found for: "TPOAB"  Pt denies feeling nodules in neck, hoarseness, dysphagia/odynophagia.  She has + FH of thyroid disorders in: mother. No FH of thyroid cancer.  No h/o radiation tx to head or neck exc. RAI tx. No recent use of iodine supplements.  Uncontrolled type 2 diabetes:  Reviewed HbA1c: Lab Results  Component Value Date   HGBA1C 10.8 (A) 08/06/2023   HGBA1C 9.6 (A) 04/26/2023   HGBA1C 10.8 (A) 01/11/2023   HGBA1C 11.3 (A) 10/26/2022   HGBA1C 12.0 (H) 08/20/2022   HGBA1C 10.6 (H) 06/03/2022   HGBA1C  9.4 (A) 04/17/2022   HGBA1C 9.7 (H) 01/09/2022   HGBA1C 8.5 (A) 10/17/2021   HGBA1C 7.1 (A) 07/11/2021   Prev. on: - Metformin 1000 mg 2x a day, with meals >> 1x a day - Farxiga 10 mg in a.m. >> stopped b/c not covered - Ozempic 0.5 mg weekly >> stopped b/c not covered - Semglee 30 >> 45 units at bedtime,  increased 07/2022  Now on: - Metformin 1000 mg 2x a day with meals - 45 units at night >> off for ~2 mo >> restarted Basaglar 30 >> 45 units daily - Humalog 8-12 >> 15-18 units >> still using 12 units 15 min before the 3 main meals >> 14-18 units 15 min before the 3 main meals (actually using 12) >> 14-18 units 15 min before the 2 main meals - Ozempic 0.25 >> 0.5 mg weekly in a.m. >> stopped as she ran out and also had diarrhea   Pt is not checking CBGs now - no test strips.  From last visit: - am: 208-255, 304 >> 182-255 >> 95, 172-209, 275 >> n/c - 2h after b'fast: 208 >> 239 >> 258, 330 >> 254 >> 373 - before lunch: n/c >> 116-241 - 2h after lunch: 225, 292, 426 >> n/c >> 320, 345 >> 341 - before dinner: n/c >> 151, 175 >> n/c - 2h after dinner: 248-393 >> 412 >> 389, 399 >>  320, 349 >> n/c - bedtime: n/c - nighttime: n/c Lowest sugar was 208 >> 95 >> 140 >> ? Highest sugar was 426 >> 349 >> 373 >> ?Marland Kitchen  Glucometer: One Touch >> ReliOn  She tries to stay away from fried food, but she works in a [employer] and is not always able to do so.  - no CKD, last BUN/creatinine:  Lab Results  Component Value Date   BUN 10 08/06/2023   BUN 8 04/26/2023   CREATININE 0.82 08/06/2023   CREATININE 0.76 04/26/2023   Lab Results  Component Value Date   MICRALBCREAT 4.0 05/06/2023   MICRALBCREAT 1.8 06/03/2022   MICRALBCREAT 13 10/16/2020   - + HL; last set of lipids: Lab Results  Component Value Date   CHOL 121 05/06/2023   HDL 37.30 (L) 05/06/2023   LDLCALC 53 05/06/2023   TRIG 153.0 (H) 05/06/2023   CHOLHDL 3 05/06/2023  On Lipitor 80 mg daily  - last eye exam  was in 2022. No DR reportedly.   - + numbness and tingling in her feet.  Last foot exam 10/26/2022.  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    Hypertension    Hypothyroidism    Pure hypercholesterolemia 05/11/2019   Past Surgical History:  Procedure Laterality Date   DILATION AND CURETTAGE, DIAGNOSTIC / THERAPEUTIC  2002   Radioiodine ablation     TUBAL LIGATION     Social History   Socioeconomic History   Marital status: Married    Spouse name: Not on file   Number of children: Not on file   Years of education: Not on file   Highest education level: GED or equivalent  Occupational History   Not on file  Tobacco Use   Smoking status: Never   Smokeless tobacco: Never  Vaping Use   Vaping status: Never Used  Substance and Sexual Activity   Alcohol use: Never   Drug use: Never   Sexual activity: Yes  Other Topics Concern   Not on file  Social History Narrative   Not on file   Social Drivers of Health   Financial Resource Strain: High Risk (08/03/2023)   Overall Financial Resource Strain (CARDIA)    Difficulty of Paying Living Expenses: Very hard  Food Insecurity: Unknown (08/03/2023)   Hunger Vital Sign    Worried About Running Out of Food in the Last Year: Never true    Ran Out of Food in the Last Year: Patient declined  Transportation Needs: No Transportation Needs (08/03/2023)   PRAPARE - Administrator, Civil Service (Medical): No    Lack of Transportation (Non-Medical): No  Physical Activity: Unknown (08/03/2023)   Exercise Vital Sign    Days of Exercise per Week: 0 days    Minutes of Exercise per Session: Not on file  Stress: No Stress Concern Present (08/03/2023)   [employer] of Occupational Health - Occupational Stress Questionnaire    Feeling of Stress : Not at all   Social Connections: Moderately Integrated (08/03/2023)   Social Connection and Isolation Panel [NHANES]    Frequency of Communication with Friends and Family: Twice a week    Frequency of Social Gatherings with Friends and Family: Twice a week    Attends Religious Services: More than 4 times  per year    Active Member of Clubs or Organizations: No    Attends Banker Meetings: Not on file    Marital Status: Married  Intimate Partner Violence: Not At Risk (08/06/2023)   Humiliation, Afraid, Rape, and Kick questionnaire    Fear of Current or Ex-Partner: No    Emotionally Abused: No    Physically Abused: No    Sexually Abused: No   Current Outpatient Medications on File Prior to Visit  Medication Sig Dispense Refill   albuterol (VENTOLIN HFA) 108 (90 Base) MCG/ACT inhaler Inhale 1-2 puffs into the lungs every 6 (six) hours as needed for wheezing or shortness of breath. 18 g 0   amLODipine (NORVASC) 10 MG tablet Take 1 tablet (10 mg total) by mouth daily. 90 tablet 1   atorvastatin (LIPITOR) 80 MG tablet Take 1 tablet (80 mg total) by mouth daily at 6 PM. 90 tablet 1   azithromycin (ZITHROMAX) 250 MG tablet Take 1 tablet (250 mg total) by mouth daily. Take first 2 tablets together, then 1 every day until finished. 6 tablet 0   benzonatate (TESSALON) 100 MG capsule Take 1 capsule (100 mg total) by mouth every 8 (eight) hours. 21 capsule 0   Blood Glucose Monitoring Suppl (FREESTYLE LITE) w/Device KIT Use as instructed to test blood sugar 2 times daily, in the morning and the evening 1 kit 0   Dulaglutide (TRULICITY) 0.75 MG/0.5ML SOAJ Inject 0.75 mg into the skin once a week. 2 mL 0   glucose blood test strip Use as instructed. Check blood glucose level by fingerstick three times per day. 200 each 12   Insulin Glargine (BASAGLAR KWIKPEN) 100 UNIT/ML Inject 45 Units into the skin at bedtime. 30 mL 3   insulin lispro (HUMALOG KWIKPEN) 200 UNIT/ML KwikPen Inject 8-12 Units into the skin  3 (three) times daily. 30 mL 3   Insulin Pen Needle 32G X 4 MM MISC Use 4 (four) times daily. 300 each 3   Insulin Syringe-Needle U-100 (INSULIN SYRINGES) 31G X 5/16" 0.5 ML MISC Use as instructed. Inject into the skin once nightly. 100 each 6   Lancets (FREESTYLE) lancets Use as instructed 100 each 12   levothyroxine (SYNTHROID) 137 MCG tablet Take 1 tablet (137 mcg total) by mouth once daily before breakfast. 60 tablet 2   losartan-hydrochlorothiazide (HYZAAR) 100-25 MG tablet Take 1 tablet by mouth daily. 90 tablet 2   metFORMIN (GLUCOPHAGE) 1000 MG tablet Take 1 tablet (1,000 mg total) by mouth 2 (two) times daily with a meal. 180 tablet 3   OneTouch Delica Lancets 33G MISC Use as instructed to check blood sugar up to 3 times daily. E11.65 100 each 11   promethazine-dextromethorphan (PROMETHAZINE-DM) 6.25-15 MG/5ML syrup Take 5 mLs by mouth 4 (four) times daily as needed for cough. 118 mL 0   No current facility-administered medications on file prior to visit.   No Known Allergies Family History  Problem Relation Age of Onset   Cancer Mother    Thyroid disease Mother    Heart attack Maternal Grandfather    Heart attack Maternal Grandmother    PE: BP 120/70   Pulse 99   Ht 6\' 1"  (1.854 m)   Wt 192 lb (87.1 kg)   LMP 12/05/2012   SpO2 97%   BMI 25.33 kg/m  Wt Readings from Last 10 Encounters:  11/04/23 192 lb (87.1 kg)  09/19/23 200 lb (90.7 kg)  09/10/23 199 lb 9.6 oz (90.5 kg)  08/06/23  202 lb 9.6 oz (91.9 kg)  05/06/23 204 lb 12.8 oz (92.9 kg)  04/26/23 206 lb (93.4 kg)  01/20/23 205 lb 6.4 oz (93.2 kg)  01/11/23 202 lb 6.4 oz (91.8 kg)  10/26/22 205 lb 3.2 oz (93.1 kg)  10/20/22 202 lb 9.6 oz (91.9 kg)   Constitutional: Slightly overweight, in NAD Eyes:  EOMI, no exophthalmos ENT: no neck masses, no cervical lymphadenopathy Cardiovascular: tachycardia, RR, No MRG Respiratory: CTA B Musculoskeletal: no deformities except enlarged left clavicle head Skin:no  rashes Neurological: no tremor with outstretched hands Diabetic Foot Exam - Simple   Simple Foot Form Diabetic Foot exam was performed with the following findings: Yes 11/04/2023  2:17 PM  Visual Inspection No deformities, no ulcerations, no other skin breakdown bilaterally: Yes Sensation Testing Intact to touch and monofilament testing bilaterally: Yes Pulse Check Posterior Tibialis and Dorsalis pulse intact bilaterally: Yes Comments Onychodystrophy B halluces    ASSESSMENT: 1.  Postablation hypothyroidism  2.  Type 2 diabetes, uncontrolled, with complications - PN  PLAN:  1. Patient with longstanding hypothyroidism, on levothyroxine therapy - latest thyroid labs reviewed with pt. >> normal: Lab Results  Component Value Date   TSH 3.26 10/26/2022  - she continues on LT4 137 mcg daily - pt feels good on this dose. - we discussed about taking the thyroid hormone every day, with water, >30 minutes before breakfast, separated by >4 hours from acid reflux medications, calcium, iron, multivitamins. Pt. is taking it correctly. - will check thyroid tests today: TSH and fT4 - If labs are abnormal, she will need to return for repeat TFTs in 1.5 months  2.  Uncontrolled type 2 diabetes -Patient with uncontrolled type 2 diabetes, basal large bolus insulin regimen, adjusted at last visit.  She is also on metformin and GLP-1 receptor agonist.  At last visit she was not able to start Ozempic but she was planning to get it, as she felt that insurance started to cover it.  We gave her a pen of Ozempic to start this soon as possible.  She was also missing doses of Humalog and taking lower doses than recommended.  I advised her to increase the dose and take it 15 minutes before each meal.  HbA1c at last visit was very high, at 10.8%. -At today's visit, she is not checking blood sugars as she does not have insurance and did not have test strips.  We discussed that it is imperative for her to check  blood sugars especially as she is taking insulin.  I recommended the ReliOn meter. -She ran out of Ozempic and was not able to restart it due to price.  She did have some diarrhea with it but would be amenable to restart at a lower dose if possible.  At today's visit we gave her a sample Mounjaro pen 2.5 mg so she can start as soon as possible.  I advised her that we can switch to Ozempic after she gets insurance.  For now, we will continue the same doses of insulin.  We gave her a sample Toujeo pen to take instead of Basaglar but did not have rapid acting insulin samples to give her.  I did advise her to let me know if she is not able to obtain her insulins and in that case we may need to switch to NPH and regular insulin vials from [facility]. -I advised her to: Patient Instructions  Please continue Levothyroxine 137 mcg daily.  Take the thyroid hormone every day,  with water, at least 30 minutes before breakfast, separated by at least 4 hours from: - acid reflux medications - calcium - iron - multivitamins  Please continue: - Metformin 1000 mg 2x a day with meals  Continue: - Basaglar 45 units at night - Humalog 14-18 units 15 min before the 2 main meals  Retry: - Ozempic 0.25 mg weekly for 2-4 weeks, then increase to 0.5 mg weekly   Please start checking blood sugars 2-3x a day - get the ReliOn meter from [place].  You need a new eye exam.  Please return in 3-4 months.  - we checked her HbA1c: 10.4% (slightly lower) - advised to check sugars at different times of the day - 4x a day, rotating check times - advised for yearly eye exams >> she is UTD - return to clinic in 3-4 months  Orders Placed This Encounter  Procedures   TSH   T4, free   Needs refills - Levothyroxine.  Component     Latest Ref Rng 11/04/2023  TSH     mIU/L 0.12 (L)   T4,Free(Direct)     0.8 - 1.8 ng/dL 1.8   TSH is now suppressed.  This could be related to weight loss.  Will decrease the dose of LT4 to  125 mcg daily and repeat the tests in 1.5 months.  Carlus Pavlov, MD PhD [facility] Endocrinology

## 2023-11-04 NOTE — Patient Instructions (Addendum)
 Please continue Levothyroxine 137 mcg daily.  Take the thyroid hormone every day, with water, at least 30 minutes before breakfast, separated by at least 4 hours from: - acid reflux medications - calcium - iron - multivitamins  Please continue: - Metformin 1000 mg 2x a day with meals  Continue: - Basaglar 45 units at night - Humalog 14-18 units 15 min before the 2 main meals  Retry: - Ozempic 0.25 mg weekly for 2-4 weeks, then increase to 0.5 mg weekly   Please start checking blood sugars 2-3x a day - get the ReliOn metre from [place].  You need a new eye exam.  Please return in 3-4 months.

## 2023-11-05 ENCOUNTER — Other Ambulatory Visit: Payer: Self-pay

## 2023-11-05 ENCOUNTER — Encounter: Payer: Self-pay | Admitting: Internal Medicine

## 2023-11-05 LAB — T4, FREE: Free T4: 1.8 ng/dL (ref 0.8–1.8)

## 2023-11-05 LAB — TSH: TSH: 0.12 m[IU]/L — ABNORMAL LOW

## 2023-11-05 MED ORDER — LEVOTHYROXINE SODIUM 125 MCG PO TABS
125.0000 ug | ORAL_TABLET | Freq: Every day | ORAL | 5 refills | Status: DC
  Filled 2023-11-05: qty 45, 45d supply, fill #0
  Filled 2023-11-08 – 2023-11-09 (×2): qty 30, 30d supply, fill #0
  Filled 2023-12-08: qty 30, 30d supply, fill #1

## 2023-11-05 NOTE — Addendum Note (Signed)
 Addended by: Carlus Pavlov on: 11/05/2023 10:24 AM   Modules accepted: Orders

## 2023-11-08 ENCOUNTER — Other Ambulatory Visit: Payer: Self-pay

## 2023-11-08 ENCOUNTER — Ambulatory Visit: Payer: 59 | Admitting: Nurse Practitioner

## 2023-11-09 ENCOUNTER — Other Ambulatory Visit (HOSPITAL_COMMUNITY): Payer: Self-pay

## 2023-11-09 ENCOUNTER — Other Ambulatory Visit: Payer: Self-pay

## 2023-11-09 ENCOUNTER — Telehealth: Payer: Self-pay | Admitting: Pharmacy Technician

## 2023-11-09 NOTE — Telephone Encounter (Signed)
 Pharmacy Patient Advocate Encounter  Received notification from [facility] that Prior Authorization for TRULICITY has been APPROVED from 11/01/23 to 10/31/24. Ran test claim, Copay is $953.73. This test claim was processed through [facility]- copay amounts may vary at other pharmacies due to pharmacy/plan contracts, or as the patient moves through the different stages of their insurance plan.  PA #/Case ID/Reference #: [id number]  I see that this med is already d/c, but the PA is approved, in case her copay is less as she goes through the year.

## 2023-11-10 ENCOUNTER — Other Ambulatory Visit: Payer: Self-pay

## 2023-11-12 ENCOUNTER — Other Ambulatory Visit: Payer: Self-pay

## 2023-11-15 ENCOUNTER — Other Ambulatory Visit: Payer: Self-pay

## 2023-11-16 ENCOUNTER — Other Ambulatory Visit: Payer: Self-pay

## 2023-11-17 ENCOUNTER — Other Ambulatory Visit: Payer: Self-pay

## 2023-11-19 ENCOUNTER — Other Ambulatory Visit: Payer: Self-pay

## 2023-11-23 ENCOUNTER — Other Ambulatory Visit: Payer: Self-pay

## 2023-11-29 ENCOUNTER — Other Ambulatory Visit: Payer: Self-pay

## 2023-11-30 ENCOUNTER — Other Ambulatory Visit: Payer: Self-pay

## 2023-12-08 ENCOUNTER — Other Ambulatory Visit: Payer: Self-pay

## 2023-12-09 ENCOUNTER — Other Ambulatory Visit: Payer: Self-pay

## 2023-12-24 ENCOUNTER — Ambulatory Visit: Attending: Nurse Practitioner | Admitting: Nurse Practitioner

## 2023-12-24 ENCOUNTER — Other Ambulatory Visit: Payer: Self-pay

## 2023-12-24 ENCOUNTER — Encounter: Payer: Self-pay | Admitting: Nurse Practitioner

## 2023-12-24 VITALS — BP 121/71 | HR 84 | Resp 19 | Ht 73.0 in | Wt 191.4 lb

## 2023-12-24 DIAGNOSIS — Z23 Encounter for immunization: Secondary | ICD-10-CM

## 2023-12-24 DIAGNOSIS — I10 Essential (primary) hypertension: Secondary | ICD-10-CM

## 2023-12-24 DIAGNOSIS — E1165 Type 2 diabetes mellitus with hyperglycemia: Secondary | ICD-10-CM

## 2023-12-24 DIAGNOSIS — Z794 Long term (current) use of insulin: Secondary | ICD-10-CM | POA: Diagnosis not present

## 2023-12-24 DIAGNOSIS — E039 Hypothyroidism, unspecified: Secondary | ICD-10-CM

## 2023-12-24 DIAGNOSIS — E785 Hyperlipidemia, unspecified: Secondary | ICD-10-CM

## 2023-12-24 DIAGNOSIS — Z7984 Long term (current) use of oral hypoglycemic drugs: Secondary | ICD-10-CM | POA: Diagnosis not present

## 2023-12-24 DIAGNOSIS — Z1211 Encounter for screening for malignant neoplasm of colon: Secondary | ICD-10-CM

## 2023-12-24 MED ORDER — METFORMIN HCL 1000 MG PO TABS
1000.0000 mg | ORAL_TABLET | Freq: Two times a day (BID) | ORAL | 3 refills | Status: DC
  Filled 2023-12-24 (×2): qty 180, 90d supply, fill #0

## 2023-12-24 MED ORDER — TRUEPLUS LANCETS 28G MISC
3 refills | Status: AC
  Filled 2023-12-24: qty 100, 33d supply, fill #0
  Filled 2024-02-17: qty 100, 33d supply, fill #1

## 2023-12-24 MED ORDER — TOUJEO SOLOSTAR 300 UNIT/ML ~~LOC~~ SOPN
45.0000 [IU] | PEN_INJECTOR | Freq: Every day | SUBCUTANEOUS | 6 refills | Status: DC
  Filled 2023-12-24: qty 3, fill #0
  Filled 2023-12-27: qty 4.5, 30d supply, fill #0
  Filled 2024-02-17: qty 13.5, 90d supply, fill #1

## 2023-12-24 MED ORDER — LOSARTAN POTASSIUM-HCTZ 100-25 MG PO TABS
1.0000 | ORAL_TABLET | Freq: Every day | ORAL | 2 refills | Status: DC
  Filled 2023-12-24: qty 90, 90d supply, fill #0
  Filled 2024-03-31: qty 30, 30d supply, fill #1
  Filled 2024-04-26: qty 30, 30d supply, fill #2
  Filled 2024-07-31: qty 30, 30d supply, fill #3
  Filled 2024-09-07: qty 30, 30d supply, fill #4

## 2023-12-24 MED ORDER — LEVOTHYROXINE SODIUM 125 MCG PO TABS
125.0000 ug | ORAL_TABLET | Freq: Every day | ORAL | 5 refills | Status: DC
  Filled 2023-12-24 – 2024-01-04 (×2): qty 45, 45d supply, fill #0
  Filled 2024-02-17 – 2024-02-21 (×2): qty 45, 45d supply, fill #1

## 2023-12-24 MED ORDER — AMLODIPINE BESYLATE 10 MG PO TABS
10.0000 mg | ORAL_TABLET | Freq: Every day | ORAL | 1 refills | Status: DC
  Filled 2023-12-24: qty 90, 90d supply, fill #0
  Filled 2024-03-31: qty 30, 30d supply, fill #1
  Filled 2024-04-26: qty 30, 30d supply, fill #2
  Filled 2024-07-31: qty 30, 30d supply, fill #3

## 2023-12-24 MED ORDER — ATORVASTATIN CALCIUM 80 MG PO TABS
80.0000 mg | ORAL_TABLET | Freq: Every day | ORAL | 1 refills | Status: DC
  Filled 2023-12-24: qty 90, 90d supply, fill #0
  Filled 2024-03-31: qty 30, 30d supply, fill #1
  Filled 2024-04-26: qty 30, 30d supply, fill #2
  Filled 2024-07-31: qty 30, 30d supply, fill #3

## 2023-12-24 MED ORDER — TRUE METRIX METER W/DEVICE KIT
PACK | 0 refills | Status: AC
  Filled 2023-12-24: qty 1, 30d supply, fill #0

## 2023-12-24 MED ORDER — GLUCOSE BLOOD VI STRP
ORAL_STRIP | 12 refills | Status: AC
  Filled 2023-12-24: qty 100, 33d supply, fill #0
  Filled 2024-01-20: qty 100, 33d supply, fill #1
  Filled 2024-02-17: qty 100, 33d supply, fill #2

## 2023-12-24 NOTE — Progress Notes (Signed)
New meter request

## 2023-12-24 NOTE — Progress Notes (Signed)
 Assessment & Plan:  Angi was seen today for medical management of chronic issues.  Diagnoses and all orders for this visit:  Primary hypertension Continue as prescribed. BP at goal -     amLODipine  (NORVASC ) 10 MG tablet; Take 1 tablet (10 mg total) by mouth daily. -     losartan -hydrochlorothiazide  (HYZAAR ) 100-25 MG tablet; Take 1 tablet by mouth daily. Continue all antihypertensives as prescribed.  Reminded to bring in blood pressure log for follow  up appointment.  RECOMMENDATIONS: DASH/Mediterranean Diets are healthier choices for HTN.    Type 2 diabetes mellitus with hyperglycemia, without long-term current use of insulin   Does not see ENDO until June. Needs refill of Toujeo .  -     insulin  glargine, 1 Unit Dial , (TOUJEO  SOLOSTAR) 300 UNIT/ML Solostar Pen; Inject 45 Units into the skin daily. -     metFORMIN  (GLUCOPHAGE ) 1000 MG tablet; Take 1 tablet (1,000 mg total) by mouth 2 (two) times daily with a meal. -     Blood Glucose Monitoring Suppl (TRUE METRIX METER) w/Device KIT; Use as instructed. Check blood glucose level by fingerstick three times per day. E11.65 -     glucose blood test strip; Use as instructed. Check blood glucose level by fingerstick three times per day. -     TRUEplus Lancets 28G MISC; Use as instructed. Check blood glucose level by fingerstick three times per day. E11.65  Need for vaccination with 20-polyvalent pneumococcal conjugate vaccine -     Pneumococcal conjugate vaccine 20-valent  Hyperlipidemia, unspecified hyperlipidemia type -     atorvastatin  (LIPITOR) 80 MG tablet; Take 1 tablet (80 mg total) by mouth daily at 6 PM.  Acquired hypothyroidism Requesting refill of levothyroxine  -     levothyroxine  (SYNTHROID ) 125 MCG tablet; Take 1 tablet (125 mcg total) by mouth daily before breakfast.  Colon cancer screening -     Cologuard    Patient has been counseled on age-appropriate routine health concerns for screening and prevention. These are  reviewed and up-to-date. Referrals have been placed accordingly. Immunizations are up-to-date or declined.    Subjective:   Chief Complaint  Patient presents with   Medical Management of Chronic Issues    Diana Fleming 58 y.o. female presents to office today for follow to HTN    She is followed by Endocrinology for her Diabetes and Thyroid  disorder.    Blood pressure is well controlled. Currently prescribed Hyzaar  100-25 mg daily and amlodipine  10 mg daily.  BP Readings from Last 3 Encounters:  12/24/23 121/71  11/04/23 120/70  09/19/23 131/86     Patient has been counseled on age-appropriate routine health concerns for screening and prevention. These are reviewed and up-to-date. Referrals have been placed accordingly. Immunizations are up-to-date or declined.     MAMMOGRAM: OVERDUE> Referral placed COLON CANCER SCREENING: OVERDUE. Sent Cologuard to home.  PAP SMEAR: UTD     Review of Systems  Constitutional:  Negative for fever, malaise/fatigue and weight loss.  HENT: Negative.  Negative for nosebleeds.   Eyes: Negative.  Negative for blurred vision, double vision and photophobia.  Respiratory: Negative.  Negative for cough and shortness of breath.   Cardiovascular: Negative.  Negative for chest pain, palpitations and leg swelling.  Gastrointestinal: Negative.  Negative for heartburn, nausea and vomiting.  Musculoskeletal: Negative.  Negative for myalgias.  Neurological: Negative.  Negative for dizziness, focal weakness, seizures and headaches.  Psychiatric/Behavioral: Negative.  Negative for suicidal ideas.     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   Outpatient Medications Prior to Visit  Medication Sig Dispense Refill   insulin  lispro (HUMALOG  KWIKPEN) 200 UNIT/ML KwikPen Inject 8-12 Units into the skin 3 (three) times daily. 30 mL 3   Insulin  Pen Needle 32G X 4 MM MISC Use 4 (four) times daily. 300 each 3   Insulin  Syringe-Needle U-100 (INSULIN  SYRINGES) 31G X 5/16" 0.5 ML MISC Use as instructed. Inject into the skin once nightly. 100 each 6   amLODipine  (NORVASC ) 10 MG tablet Take 1 tablet (10 mg total) by mouth daily. 90 tablet 1   atorvastatin  (LIPITOR) 80 MG tablet Take 1 tablet (80 mg total) by mouth daily at 6 PM. 90 tablet 1   levothyroxine  (SYNTHROID ) 125 MCG tablet Take 1 tablet (125 mcg total) by mouth daily before breakfast. 45 tablet 5   losartan -hydrochlorothiazide  (HYZAAR ) 100-25 MG tablet Take 1 tablet by mouth daily. 90 tablet 2   metFORMIN  (GLUCOPHAGE ) 1000 MG tablet Take 1 tablet (1,000 mg total) by mouth 2 (two) times daily with a meal. 180 tablet 3   Blood Glucose Monitoring Suppl (FREESTYLE LITE) w/Device KIT Use as instructed to test blood sugar 2 times daily, in the morning and the evening (Patient not taking: Reported on 12/24/2023) 1 kit 0   Insulin  Glargine (BASAGLAR  KWIKPEN) 100 UNIT/ML Inject 45 Units into the skin at bedtime. (Patient not taking: Reported on 01/24/2024) 30 mL 3   glucose blood test strip Use as instructed. Check blood glucose level by fingerstick three times per day. (Patient not taking: Reported on 12/24/2023) 200 each 12   Lancets (FREESTYLE) lancets Use as instructed (Patient not taking: Reported on 12/24/2023) 100 each 12   OneTouch Delica Lancets 33G MISC Use as instructed to check blood  sugar up to 3 times daily. E11.65 (Patient not taking: Reported on 12/24/2023) 100 each 11   No facility-administered medications prior to visit.    No Known Allergies     Objective:    BP 121/71 (BP Location: Left Arm, Patient Position: Sitting, Cuff Size: Normal)   Pulse 84   Resp 19   Ht 6\' 1"  (1.854 m)   Wt 191 lb 6.4 oz (86.8 kg)   LMP 12/05/2012   SpO2 100%   BMI 25.25 kg/m  Wt Readings from Last 3 Encounters:  12/24/23 191 lb 6.4 oz (86.8 kg)  11/04/23 192 lb (87.1 kg)  09/19/23 200 lb (90.7 kg)    Physical Exam Vitals and nursing note reviewed.  Constitutional:      Appearance: She is well-developed.  HENT:     Head: Normocephalic and atraumatic.  Cardiovascular:     Rate and Rhythm: Normal rate and regular rhythm.     Heart sounds: Normal heart sounds. No murmur heard.  No friction rub. No gallop.  Pulmonary:     Effort: Pulmonary effort is normal. No tachypnea or respiratory distress.     Breath sounds: Normal breath sounds. No decreased breath sounds, wheezing, rhonchi or rales.  Chest:     Chest wall: No tenderness.  Abdominal:     General: Bowel sounds are normal.     Palpations: Abdomen is soft.  Musculoskeletal:        General: Normal range of motion.     Cervical back: Normal range of motion.  Skin:    General: Skin is warm and dry.  Neurological:     Mental Status: She is alert and oriented to person, place, and time.     Coordination: Coordination normal.  Psychiatric:        Behavior: Behavior normal. Behavior is cooperative.        Thought Content: Thought content normal.        Judgment: Judgment normal.          Patient has been counseled extensively about nutrition and exercise as well as the importance of adherence with medications and regular follow-up. The patient was given clear instructions to go to ER or return to medical center if symptoms don't improve, worsen or new problems develop. The patient verbalized understanding.    Follow-up: Return in about 3 months (around 03/24/2024).   Collins Dean, FNP-BC [facility] [place], [place] [phone]   01/24/2024, 7:09 PM

## 2023-12-27 ENCOUNTER — Other Ambulatory Visit: Payer: Self-pay

## 2023-12-28 ENCOUNTER — Other Ambulatory Visit: Payer: Self-pay

## 2024-01-03 ENCOUNTER — Other Ambulatory Visit: Payer: Self-pay

## 2024-01-04 ENCOUNTER — Other Ambulatory Visit: Payer: Self-pay

## 2024-01-07 ENCOUNTER — Telehealth: Payer: Self-pay

## 2024-01-07 NOTE — Telephone Encounter (Signed)
Submitted application for TOUJEO to [facility] for patient assistance.   Phone: [phone]

## 2024-01-13 ENCOUNTER — Telehealth: Payer: Self-pay

## 2024-01-13 NOTE — Telephone Encounter (Signed)
 Patient Assistance  Medication:Ozempic   Dosage:0.25/0.5 mg Quantity:4 boxes Date received:01/13/24

## 2024-01-14 NOTE — Telephone Encounter (Signed)
Patient came in to office today and picked up 4 boxes of patient assistance Ozempic.

## 2024-01-21 ENCOUNTER — Other Ambulatory Visit: Payer: Self-pay

## 2024-01-28 ENCOUNTER — Other Ambulatory Visit: Payer: Self-pay

## 2024-01-28 ENCOUNTER — Telehealth: Payer: Self-pay

## 2024-01-28 NOTE — Telephone Encounter (Signed)
 Received notification from [facility] regarding approval for TOUJEO . Patient assistance approved from 01/26/2024 to 01/25/2025.  Medication will ship to [address], [place], [place] [address] [facility]  Pt ID: [id number]  Company phone: [phone]

## 2024-02-07 ENCOUNTER — Ambulatory Visit: Payer: Self-pay | Admitting: Nurse Practitioner

## 2024-02-07 LAB — COLOGUARD

## 2024-02-11 ENCOUNTER — Other Ambulatory Visit: Payer: Self-pay

## 2024-02-14 ENCOUNTER — Other Ambulatory Visit: Payer: Self-pay

## 2024-02-17 ENCOUNTER — Other Ambulatory Visit: Payer: Self-pay

## 2024-02-17 ENCOUNTER — Encounter: Payer: Self-pay | Admitting: Pharmacist

## 2024-02-17 ENCOUNTER — Other Ambulatory Visit (HOSPITAL_BASED_OUTPATIENT_CLINIC_OR_DEPARTMENT_OTHER): Payer: Self-pay | Admitting: Pharmacist

## 2024-02-17 DIAGNOSIS — Z794 Long term (current) use of insulin: Secondary | ICD-10-CM

## 2024-02-17 DIAGNOSIS — Z7985 Long-term (current) use of injectable non-insulin antidiabetic drugs: Secondary | ICD-10-CM

## 2024-02-17 DIAGNOSIS — Z7984 Long term (current) use of oral hypoglycemic drugs: Secondary | ICD-10-CM

## 2024-02-17 DIAGNOSIS — E119 Type 2 diabetes mellitus without complications: Secondary | ICD-10-CM

## 2024-02-17 NOTE — Progress Notes (Signed)
 Pharmacy TNM Diabetes Measure Review  S:  Patient was identified in a report as being at risk for failing the True [place] Metric of A1c control (<8%). Last A1c was 10.8. Last PCP visit was 12/24/2023. Of note, she also saw Endo 11/04/23 and has an upcoming visit with them scheduled for 02/23/2024.  Call placed to patient to discuss diabetes control and medication management. She is in good spirits today.  Current diabetes medications include: Toujeo  45 units daily, Humalog  8-12 units before meals, metformin  100 mg BID, Ozempic  0.25 mg weekly via PASS with Endocrine. Patient denies adherence to insulin . But is taking metformin  and Ozempic . She gets Ozempic  via PASS with Endocrine.   Insurance coverage: generic commercial  O:   Lab Results  Component Value Date   HGBA1C 10.8 (A) 08/06/2023   There were no vitals filed for this visit.  Lipid Panel     Component Value Date/Time   CHOL 121 05/06/2023 0904   CHOL 127 12/17/2021 0800   TRIG 153.0 (H) 05/06/2023 0904   HDL 37.30 (L) 05/06/2023 0904   HDL 48 12/17/2021 0800   CHOLHDL 3 05/06/2023 0904   VLDL 30.6 05/06/2023 0904   LDLCALC 53 05/06/2023 0904   LDLCALC 64 12/17/2021 0800    Clinical Atherosclerotic Cardiovascular Disease (ASCVD): No  The ASCVD Risk score (Arnett DK, et al., 2019) failed to calculate for the following reasons:   The valid total cholesterol range is 130 to 320 mg/dL   Patient is participating in a Managed Medicaid Plan: No   A/P: Diabetes longstanding currently uncontrolled based on last A1c. Medication adherence is suboptimal. Her reported sugars are in the 200s. Instead of restarting 45 units, I have instructed her to take 30 units once daily until she sees Dr. Aldona Amel Monday to try and avoid hypoglycemia. Will hold off on having her refill her bolus until she sees Endo. -Restarted Toujeo  but at a reduced dose of 30 units daily.  -Continue Ozempic  once weekly as prescribed.  -Continue metformin  1000 mg  BID as prescribed.  -Extensively discussed pathophysiology of diabetes, recommended lifestyle interventions, dietary effects on blood sugar control.  -Counseled on s/sx of and management of hypoglycemia.  -Next A1c anticipated next week With Endo.   Follow-up:  Pharmacist prn Endocrine 02/21/2024.   Diana Fleming, PharmD, Becky Bowels, CPP Clinical Pharmacist [facility] [phone]

## 2024-02-21 ENCOUNTER — Other Ambulatory Visit: Payer: Self-pay

## 2024-02-21 ENCOUNTER — Ambulatory Visit (INDEPENDENT_AMBULATORY_CARE_PROVIDER_SITE_OTHER): Admitting: Internal Medicine

## 2024-02-21 ENCOUNTER — Encounter: Payer: Self-pay | Admitting: Internal Medicine

## 2024-02-21 VITALS — BP 128/70 | HR 98 | Ht 73.0 in | Wt 191.4 lb

## 2024-02-21 DIAGNOSIS — E7800 Pure hypercholesterolemia, unspecified: Secondary | ICD-10-CM

## 2024-02-21 DIAGNOSIS — E1165 Type 2 diabetes mellitus with hyperglycemia: Secondary | ICD-10-CM

## 2024-02-21 DIAGNOSIS — E1142 Type 2 diabetes mellitus with diabetic polyneuropathy: Secondary | ICD-10-CM

## 2024-02-21 DIAGNOSIS — E039 Hypothyroidism, unspecified: Secondary | ICD-10-CM | POA: Diagnosis not present

## 2024-02-21 DIAGNOSIS — Z7984 Long term (current) use of oral hypoglycemic drugs: Secondary | ICD-10-CM

## 2024-02-21 DIAGNOSIS — Z794 Long term (current) use of insulin: Secondary | ICD-10-CM

## 2024-02-21 LAB — POCT GLYCOSYLATED HEMOGLOBIN (HGB A1C): Hemoglobin A1C: 9.4 % — AB (ref 4.0–5.6)

## 2024-02-21 LAB — COLOGUARD

## 2024-02-21 MED ORDER — METFORMIN HCL ER 500 MG PO TB24
1000.0000 mg | ORAL_TABLET | Freq: Two times a day (BID) | ORAL | 3 refills | Status: AC
  Filled 2024-02-21: qty 360, 90d supply, fill #0
  Filled 2024-05-24 – 2024-09-07 (×2): qty 360, 90d supply, fill #1

## 2024-02-21 MED ORDER — HUMALOG KWIKPEN 200 UNIT/ML ~~LOC~~ SOPN: 10.0000 [IU] | PEN_INJECTOR | Freq: Three times a day (TID) | SUBCUTANEOUS | Status: DC

## 2024-02-21 MED ORDER — TOUJEO SOLOSTAR 300 UNIT/ML ~~LOC~~ SOPN: 36.0000 [IU] | PEN_INJECTOR | Freq: Every day | SUBCUTANEOUS | Status: DC

## 2024-02-21 NOTE — Patient Instructions (Addendum)
 Please continue Levothyroxine  125 mcg daily.  Take the thyroid  hormone every day, with water, at least 30 minutes before breakfast, separated by at least 4 hours from: - acid reflux medications - calcium  - iron - multivitamins  Please continue: - Metformin  1000 mg 2x a day with meals - change to the metformin  ER formulation - Ozempic  0.5 mg weekly   Increase: - Basaglar  or Toujeo  36 units at night  Restart: - Humalog  15 min before meals 10 units before  a smaller meal 12 units before a regular meal 14 units before a larger meal  You need a new eye exam.  Please return in 3-4 months.

## 2024-02-21 NOTE — Addendum Note (Signed)
 Addended by: CLEOTILDE ROLIN RAMAN on: 02/21/2024 04:43 PM   Modules accepted: Orders

## 2024-02-21 NOTE — Progress Notes (Signed)
 Patient ID: Diana Fleming, female   DOB: 04-30-1966, 58 y.o.   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  She was previously seen by Dr. Kassie who was managing her hypothyroidism. Last visit with me 3.5 months ago.  Interim history: No increased urination, blurry vision, nausea, no chest pain.  Hypothyroidis: - dx'ed in 2010, after radioactive iodine ablation >> on generic levothyroxine .  Pt takes the levothyroxine  125 mcg daily, decreased 10/2023: - in am - fasting - at least 30 min from b'fast - no calcium  - no iron - + multivitamins >4h later - no PPIs - not on Biotin  I reviewed pt's thyroid  tests: Lab Results  Component Value Date   TSH 0.12 (L) 11/04/2023   TSH 3.26 10/26/2022   TSH 7.17 (H) 08/21/2022   TSH 4.87 06/03/2022   TSH 0.50 11/06/2021   TSH 0.59 05/09/2021   TSH 0.017 (L) 02/05/2021   TSH 0.377 (L) 12/25/2020   TSH 0.045 (L) 09/18/2020   TSH 1.670 01/31/2020   FREET4 1.8 11/04/2023   FREET4 1.01 10/26/2022   FREET4 1.35 11/06/2021   FREET4 1.04 05/09/2021   FREET4 1.55 12/25/2020   Antithyroid antibodies: No results found for: THGAB No components found for: TPOAB  Pt denies feeling nodules in neck, hoarseness, dysphagia/odynophagia.  She has + FH of thyroid  disorders in: mother. No FH of thyroid  cancer.  No h/o radiation tx to head or neck exc. RAI tx. No recent use of iodine supplements.  Uncontrolled type 2 diabetes:  Reviewed HbA1c: Lab Results  Component Value Date   HGBA1C 10.8 (A) 08/06/2023   HGBA1C 9.6 (A) 04/26/2023   HGBA1C 10.8 (A) 01/11/2023   HGBA1C 11.3 (A) 10/26/2022   HGBA1C 12.0 (H) 08/20/2022   HGBA1C 10.6 (H) 06/03/2022   HGBA1C 9.4 (A) 04/17/2022   HGBA1C 9.7 (H) 01/09/2022   HGBA1C 8.5 (A) 10/17/2021   HGBA1C 7.1 (A) 07/11/2021   Prev. on: - Metformin  1000 mg 2x a day, with meals >> 1x a  day - Farxiga  10 mg in a.m. >> stopped b/c not covered - Ozempic  0.5 mg weekly >> stopped b/c not covered - Semglee  30 >> 45 units at bedtime,  increased 07/2022  Now on: - Metformin  1000 mg 2x a day with meals - some nausea - 45 units at night >> off for ~2 mo >> restarted Basaglar  30 >> 45 >>  off >> just restarted 30 units daily - Humalog  8-12 >> 15-18 units >> still using 12 units 15 min before the 3 main meals >> 14-18 units 15 min >>  before the 3 main meals (actually using 12) >> 14-18 units 15 min before the 2 main meals >> off - Ozempic  0.25 >> 0.5 mg weekly in a.m. >> stopped as she ran out and also had diarrhea >> restarted 0.5 mg weekly  Pt is checking CBGs 0-1x a day: - am: 208-255, 304 >> 182-255 >> 95, 172-209, 275 >> n/c >> 151, 179-251 - 2h after b'fast: 208 >> 239 >> 258, 330 >> 254 >> 373 >> n/c - before lunch: n/c >> 116-241 >> 176-310 - 2h after lunch: 225, 292, 426 >> n/c >> 320, 345 >> 341 >> 192-294 - before dinner: n/c >> 151, 175 >> n/c >> 237-292 - 2h after dinner: 412 >> 389, 399 >> 320, 349 >> n/c >> 183-347 - bedtime: n/c -  nighttime: n/c Lowest sugar was 208 >> 95 >> 140 >> 151 Highest sugar was 426 >> 349 >> 373 >> 347  Glucometer: One Touch >> ReliOn  She tries to stay away from fried food, but she works in a [employer] and is not always able to do so.  - no CKD, last BUN/creatinine:  Lab Results  Component Value Date   BUN 10 08/06/2023   BUN 8 04/26/2023   CREATININE 0.82 08/06/2023   CREATININE 0.76 04/26/2023   Lab Results  Component Value Date   MICRALBCREAT 4.0 05/06/2023   MICRALBCREAT 1.8 06/03/2022   MICRALBCREAT 13 10/16/2020   - + HL; last set of lipids: Lab Results  Component Value Date   CHOL 121 05/06/2023   HDL 37.30 (L) 05/06/2023   LDLCALC 53 05/06/2023   TRIG 153.0 (H) 05/06/2023   CHOLHDL 3 05/06/2023  On Lipitor 80 mg daily  - last eye exam was in 2022. No DR reportedly.   - + numbness and tingling in  her feet.  Last foot exam 11/04/2023.  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    Hypertension    Hypothyroidism    Pure hypercholesterolemia 05/11/2019   Past Surgical History:  Procedure Laterality Date   DILATION AND CURETTAGE, DIAGNOSTIC / THERAPEUTIC  2002   Radioiodine ablation     TUBAL LIGATION     Social History   Socioeconomic History   Marital status: Married    Spouse name: Not on file   Number of children: Not on file   Years of education: Not on file   Highest education level: GED or equivalent  Occupational History   Not on file  Tobacco Use   Smoking status: Never   Smokeless tobacco: Never  Vaping Use   Vaping status: Never Used  Substance and Sexual Activity   Alcohol use: Never   Drug use: Never   Sexual activity: Yes  Other Topics Concern   Not on file  Social History Narrative   Not on file   Social Drivers of Health   Financial Resource Strain: High Risk (08/03/2023)   Overall Financial Resource Strain (CARDIA)    Difficulty of Paying Living Expenses: Very hard  Food Insecurity: Unknown (08/03/2023)   Hunger Vital Sign    Worried About Running Out of Food in the Last Year: Never true    Ran Out of Food in the Last Year: Patient declined  Transportation Needs: No Transportation Needs (08/03/2023)   PRAPARE - Administrator, Civil Service (Medical): No    Lack of Transportation (Non-Medical): No  Physical Activity: Unknown (08/03/2023)   Exercise Vital Sign    Days of Exercise per Week: 0 days    Minutes of Exercise per Session: Not on file  Stress: No Stress Concern Present (08/03/2023)   [employer] of Occupational Health - Occupational Stress Questionnaire    Feeling of Stress : Not at all  Social Connections: Moderately Integrated (08/03/2023)    Social Connection and Isolation Panel    Frequency of Communication with Friends and Family: Twice a week    Frequency of Social Gatherings with Friends and Family: Twice a week    Attends Religious Services: More than 4 times per year    Active Member of [employer]  or Organizations: No    Attends Banker Meetings: Not on file    Marital Status: Married  Intimate Partner Violence: Not At Risk (08/06/2023)   Humiliation, Afraid, Rape, and Kick questionnaire    Fear of Current or Ex-Partner: No    Emotionally Abused: No    Physically Abused: No    Sexually Abused: No   Current Outpatient Medications on File Prior to Visit  Medication Sig Dispense Refill   amLODipine  (NORVASC ) 10 MG tablet Take 1 tablet (10 mg total) by mouth daily. 90 tablet 1   atorvastatin  (LIPITOR) 80 MG tablet Take 1 tablet (80 mg total) by mouth daily at 6 PM. 90 tablet 1   Blood Glucose Monitoring Suppl (FREESTYLE LITE) w/Device KIT Use as instructed to test blood sugar 2 times daily, in the morning and the evening (Patient not taking: Reported on 12/24/2023) 1 kit 0   Blood Glucose Monitoring Suppl (TRUE METRIX METER) w/Device KIT Use as instructed. Check blood glucose level by fingerstick three times per day. E11.65 1 kit 0   glucose blood test strip Use as instructed. Check blood glucose level by fingerstick three times per day. 200 each 12   insulin  glargine, 1 Unit Dial , (TOUJEO  SOLOSTAR) 300 UNIT/ML Solostar Pen Inject 45 Units into the skin daily. 4.5 mL 6   insulin  lispro (HUMALOG  KWIKPEN) 200 UNIT/ML KwikPen Inject 8-12 Units into the skin 3 (three) times daily. 30 mL 3   Insulin  Pen Needle 32G X 4 MM MISC Use 4 (four) times daily. 300 each 3   Insulin  Syringe-Needle U-100 (INSULIN  SYRINGES) 31G X 5/16 0.5 ML MISC Use as instructed. Inject into the skin once nightly. 100 each 6   levothyroxine  (SYNTHROID ) 125 MCG tablet Take 1 tablet (125 mcg total) by mouth daily before breakfast. 45 tablet 5    losartan -hydrochlorothiazide  (HYZAAR ) 100-25 MG tablet Take 1 tablet by mouth daily. 90 tablet 2   metFORMIN  (GLUCOPHAGE ) 1000 MG tablet Take 1 tablet (1,000 mg total) by mouth 2 (two) times daily with a meal. 180 tablet 3   TRUEplus Lancets 28G MISC Use as instructed. Check blood glucose level by fingerstick three times per day. E11.65 200 each 3   No current facility-administered medications on file prior to visit.   No Known Allergies Family History  Problem Relation Age of Onset   Cancer Mother    Thyroid  disease Mother    Heart attack Maternal Grandfather    Heart attack Maternal Grandmother    PE: BP 128/70   Pulse 98   Ht 6' 1 (1.854 m)   Wt 191 lb 6.4 oz (86.8 kg)   LMP 12/05/2012   SpO2 95%   BMI 25.25 kg/m  Wt Readings from Last 10 Encounters:  02/21/24 191 lb 6.4 oz (86.8 kg)  12/24/23 191 lb 6.4 oz (86.8 kg)  11/04/23 192 lb (87.1 kg)  09/19/23 200 lb (90.7 kg)  09/10/23 199 lb 9.6 oz (90.5 kg)  08/06/23 202 lb 9.6 oz (91.9 kg)  05/06/23 204 lb 12.8 oz (92.9 kg)  04/26/23 206 lb (93.4 kg)  01/20/23 205 lb 6.4 oz (93.2 kg)  01/11/23 202 lb 6.4 oz (91.8 kg)   Constitutional: Slightly overweight, in NAD Eyes:  EOMI, no exophthalmos ENT: no neck masses, no cervical lymphadenopathy Cardiovascular: tachycardia, RR, No MRG Respiratory: CTA B Musculoskeletal: no deformities except enlarged left clavicle head Skin:no rashes Neurological: no tremor with outstretched hands  ASSESSMENT: 1.  Postablation hypothyroidism  2.  Type 2  diabetes, uncontrolled, with complications - PN  3. HL  PLAN:  1. Patient with longstanding hypothyroidism, on levothyroxine  therapy - latest thyroid  labs reviewed with pt. >> TSH was suppressed: Lab Results  Component Value Date   TSH 0.12 (L) 11/04/2023  - she continues on LT4 125 mcg daily, decreased after the above results returned - pt feels good on this dose. - we discussed about taking the thyroid  hormone every day, with  water, >30 minutes before breakfast, separated by >4 hours from acid reflux medications, calcium , iron, multivitamins. Pt. is taking it correctly. - will check thyroid  tests today: TSH and fT4 - If labs are abnormal, she will need to return for repeat TFTs in 1.5 months  2.  Uncontrolled type 2 diabetes -Patient with uncontrolled type 2 diabetes, on basal-bolus insulin  regimen, to which I recommended to add back Ozempic .  At that time, insurance was not covering it but she was planning to get insurance so we gave her samples of Mounjaro, and then Ozempic .  We did not change her insulin  regimen at that time.  We did discuss about letting me know if she cannot afford her insulin , in which case we discussed about switching to NPH and regular insulin  bottles from [employer].  Latest HbA1c was very high, at 10.4%, but slightly lower than before. - At today's visit, she mentions that after last visit, she started Ozempic , but misunderstood instructions and stopped all of her insulin ... Sugars remain very high.  3 days ago the pharmacist advised her that she is also supposed to be on insulin  and she restarted Basaglar  at a lower dose, 30 units daily.  Immediately after starting this, sugars started to improve.  As of now, in the setting of the improved blood sugars, advised her to increase the Basaglar  slightly and was started back on Humalog  but at a lower dose than previously.  Will continue Ozempic  and metformin .  Since she has nausea with the IR formulation of metformin , I advised her to switch to the ER formulation. -I advised her to: Patient Instructions  Please continue Levothyroxine  125 mcg daily.  Take the thyroid  hormone every day, with water, at least 30 minutes before breakfast, separated by at least 4 hours from: - acid reflux medications - calcium  - iron - multivitamins  Please continue: - Metformin  1000 mg 2x a day with meals - change to the metformin  ER formulation - Ozempic  0.5 mg weekly    Increase: - Basaglar  or Toujeo  36 units at night  Restart: - Humalog  15 min before meals 10 units before  a smaller meal 12 units before a regular meal 14 units before a larger meal  You need a new eye exam.  Please return in 3-4 months.  - we checked her HbA1c: 9.4% (lower) - advised to check sugars at different times of the day - 4x a day, rotating check times - advised for yearly eye exams >> she is not UTD - return to clinic in 3-4 months  3. HL - Reviewed latest lipid panel from 05/2023: LDL at goal, HDL slightly low: Lab Results  Component Value Date   CHOL 121 05/06/2023   HDL 37.30 (L) 05/06/2023   LDLCALC 53 05/06/2023   TRIG 153.0 (H) 05/06/2023   CHOLHDL 3 05/06/2023  - Continues atorvastatin  80 mg daily without side effects - she is due for another lipid panel-will check this today   Orders Placed This Encounter  Procedures   TSH   T4, free  Lipid Panel w/reflex Direct LDL   Lela Fendt, MD PhD [facility] End[facility]ology

## 2024-02-22 ENCOUNTER — Other Ambulatory Visit (HOSPITAL_COMMUNITY): Payer: Self-pay

## 2024-02-22 ENCOUNTER — Other Ambulatory Visit: Payer: Self-pay

## 2024-02-22 ENCOUNTER — Ambulatory Visit: Payer: Self-pay | Admitting: Internal Medicine

## 2024-02-22 LAB — LIPID PANEL W/REFLEX DIRECT LDL
Cholesterol: 110 mg/dL (ref <200)
HDL: 37 mg/dL — ABNORMAL LOW (ref >=50)
LDL Cholesterol (Calc): 51 mg/dL
Non-HDL Cholesterol (Calc): 73 mg/dL (ref <130)
Total CHOL/HDL Ratio: 3 (calc) (ref <5.0)
Triglycerides: 140 mg/dL (ref <150)

## 2024-02-22 LAB — TSH: TSH: 0.37 m[IU]/L — ABNORMAL LOW

## 2024-02-22 LAB — T4, FREE: Free T4: 1.7 ng/dL (ref 0.8–1.8)

## 2024-02-22 MED ORDER — LEVOTHYROXINE SODIUM 112 MCG PO TABS
112.0000 ug | ORAL_TABLET | Freq: Every day | ORAL | 3 refills | Status: DC
  Filled 2024-02-22: qty 45, 45d supply, fill #0
  Filled 2024-03-29: qty 45, 45d supply, fill #1
  Filled 2024-04-26 – 2024-05-16 (×2): qty 45, 45d supply, fill #2

## 2024-02-22 NOTE — Addendum Note (Signed)
 Addended by: TRIXIE FILE on: 02/22/2024 12:54 PM   Modules accepted: Orders

## 2024-02-25 ENCOUNTER — Other Ambulatory Visit: Payer: Self-pay

## 2024-03-24 ENCOUNTER — Ambulatory Visit: Admitting: Nurse Practitioner

## 2024-03-25 LAB — COLOGUARD

## 2024-03-31 ENCOUNTER — Other Ambulatory Visit: Payer: Self-pay

## 2024-04-05 ENCOUNTER — Other Ambulatory Visit (HOSPITAL_BASED_OUTPATIENT_CLINIC_OR_DEPARTMENT_OTHER): Payer: Self-pay | Admitting: Pharmacist

## 2024-04-05 DIAGNOSIS — Z794 Long term (current) use of insulin: Secondary | ICD-10-CM

## 2024-04-05 DIAGNOSIS — Z7985 Long-term (current) use of injectable non-insulin antidiabetic drugs: Secondary | ICD-10-CM

## 2024-04-05 DIAGNOSIS — Z7984 Long term (current) use of oral hypoglycemic drugs: Secondary | ICD-10-CM

## 2024-04-05 DIAGNOSIS — E119 Type 2 diabetes mellitus without complications: Secondary | ICD-10-CM

## 2024-04-05 NOTE — Progress Notes (Signed)
 Pharmacy TNM Diabetes Measure Review  S:  Patient was identified in a report as being at risk for failing the True [place] Metric of A1c control (<8%). Last A1c was 9.4% on 02/21/24 w/ Endo. Last PCP visit was 12/24/2023.   Call placed to patient to discuss diabetes control and medication management. I was unable to reach her so left HIPAA-compliant VM with instructions to return my call if needed. Of note, I spoke with her on 02/17/24 and found that she was only using Ozempic . We covered that she needs to be on Ozempic  + insulin  + metformin . She has MAP approved for Toujeo . She appears to be filling this and metformin  regularly. She is also getting Ozempic  from her Endocrine office.   When I spoke with her, I advised her to restart Toujeo  at 30 units daily until she saw Endocrine. She saw endocrine on 02/21/24. At that visit, she endorsed adherence to Toujeo  30 units daily, metformin  1000 mg BID, and Ozempic  0.5 mg weekly. She was not taking Humalog . Home CBGs ranged in the 100-300s but showed improvement since restarting her Toujeo . Endocrine instructed her to increase Toujeo  to 36 units daily, resume Humalog , and continue both Ozempic  and metformin .   I called her today to go over these instructions again and to encourage her.  Current diabetes medications include: Toujeo  36 units daily, Humalog  10-14 units TID before meals, metformin  100 mg BID, Ozempic  0.5 mg weekly via PASS with Endocrine.  Fill dates:  -Toujeo : 02/17/24 for a 3 month supply -Humalog : 12/09/23 for a 1 month supply -Metformin : 02/21/24 for a 3 month supply  -Ozempic : unknown  Insurance coverage: [employer]   O:   Lab Results  Component Value Date   HGBA1C 9.4 (A) 02/21/2024   There were no vitals filed for this visit.  Lipid Panel     Component Value Date/Time   CHOL 110 02/21/2024 1018   CHOL 127 12/17/2021 0800   TRIG 140 02/21/2024 1018   HDL 37 (L) 02/21/2024 1018   HDL 48 12/17/2021 0800   CHOLHDL 3.0  02/21/2024 1018   VLDL 30.6 05/06/2023 0904   LDLCALC 51 02/21/2024 1018    Clinical Atherosclerotic Cardiovascular Disease (ASCVD): No  The ASCVD Risk score (Arnett DK, et al., 2019) failed to calculate for the following reasons:   The valid total cholesterol range is 130 to 320 mg/dL   Patient is participating in a Managed Medicaid Plan: No   A/P: Diabetes longstanding currently uncontrolled based on last A1c. Medication adherence appears to be improving. I was unable to connect her today. I set myself reminder to try again when she is due for refills next month..  -Next A1c anticipated next week With Endo.   Follow-up:  Pharmacist prn Endocrine 05/2024.   Herlene Fleeta Morris, PharmD, JAQUELINE, CPP Clinical Pharmacist [facility] [phone]

## 2024-04-26 ENCOUNTER — Other Ambulatory Visit: Payer: Self-pay | Admitting: Internal Medicine

## 2024-04-26 ENCOUNTER — Other Ambulatory Visit: Payer: Self-pay

## 2024-04-26 DIAGNOSIS — E1165 Type 2 diabetes mellitus with hyperglycemia: Secondary | ICD-10-CM

## 2024-04-26 MED ORDER — HUMALOG KWIKPEN 200 UNIT/ML ~~LOC~~ SOPN
8.0000 [IU] | PEN_INJECTOR | Freq: Three times a day (TID) | SUBCUTANEOUS | 3 refills | Status: DC
  Filled 2024-04-26: qty 6, 30d supply, fill #0
  Filled 2024-05-24: qty 6, 30d supply, fill #1

## 2024-04-27 ENCOUNTER — Telehealth: Payer: Self-pay

## 2024-04-27 NOTE — Telephone Encounter (Signed)
 Patient Assistance  Medication:Ozempic  Dosage:0.25/0.5 mg Quantity:4  Rolin KRAFT

## 2024-05-17 ENCOUNTER — Other Ambulatory Visit: Payer: Self-pay | Admitting: Pharmacist

## 2024-05-17 ENCOUNTER — Other Ambulatory Visit: Payer: Self-pay

## 2024-05-17 DIAGNOSIS — E1165 Type 2 diabetes mellitus with hyperglycemia: Secondary | ICD-10-CM

## 2024-05-17 MED ORDER — TOUJEO SOLOSTAR 300 UNIT/ML ~~LOC~~ SOPN
36.0000 [IU] | PEN_INJECTOR | Freq: Every day | SUBCUTANEOUS | 2 refills | Status: DC
  Filled 2024-05-17: qty 4.5, 38d supply, fill #0

## 2024-05-19 ENCOUNTER — Other Ambulatory Visit: Payer: Self-pay

## 2024-05-24 ENCOUNTER — Other Ambulatory Visit (HOSPITAL_BASED_OUTPATIENT_CLINIC_OR_DEPARTMENT_OTHER): Payer: Self-pay | Admitting: Pharmacist

## 2024-05-24 ENCOUNTER — Other Ambulatory Visit: Payer: Self-pay

## 2024-05-24 DIAGNOSIS — E119 Type 2 diabetes mellitus without complications: Secondary | ICD-10-CM

## 2024-05-24 NOTE — Progress Notes (Signed)
 Pharmacy TNM Diabetes Measure Review  S:  Patient was identified in a report as being at risk for failing the True [place] Metric of A1c control (<8%). Last A1c was 9.4% on 02/21/24 w/ Endo. Last PCP visit was 12/24/2023. Of note, she has an upcoming appt with Endo on 06/27/2024.  I have been reviewing rxn dispense hx to help refills stay up to date. I placed a refill for Toujeo  last week and this was filled on 05/19/2024. She appears to be filling this, Humalog , and metformin  regularly. She is also getting Ozempic  from her Endocrine office via patient assistance.   Current diabetes medications include: Toujeo  36 units daily, Humalog  10-14 units TID before meals, metformin  1000 mg BID (taken as two 500 mg XR tablets BID), Ozempic  0.5 via PASS with Endocrine.  Fill dates:  -Toujeo : 05/19/2024 for a 38 day supply -Humalog : 04/26/2024 for a 30 day supply -Metformin : 02/21/24 for a 3 month supply  -Ozempic : fills with the Endo office   Insurance coverage: [employer]   O:   Lab Results  Component Value Date   HGBA1C 9.4 (A) 02/21/2024   There were no vitals filed for this visit.  Lipid Panel     Component Value Date/Time   CHOL 110 02/21/2024 1018   CHOL 127 12/17/2021 0800   TRIG 140 02/21/2024 1018   HDL 37 (L) 02/21/2024 1018   HDL 48 12/17/2021 0800   CHOLHDL 3.0 02/21/2024 1018   VLDL 30.6 05/06/2023 0904   LDLCALC 51 02/21/2024 1018    Clinical Atherosclerotic Cardiovascular Disease (ASCVD): No  The ASCVD Risk score (Arnett DK, et al., 2019) failed to calculate for the following reasons:   The valid total cholesterol range is 130 to 320 mg/dL   Patient is participating in a Managed Medicaid Plan: No   A/P: Diabetes longstanding currently uncontrolled based on last A1c. Medication adherence appears to be improving. -Next A1c anticipated next month With Endo.   Follow-up:  Pharmacist prn. Endocrine 06/27/24.   Herlene Fleeta Morris, PharmD, JAQUELINE, CPP Clinical  Pharmacist [facility] [phone]

## 2024-05-25 ENCOUNTER — Other Ambulatory Visit (HOSPITAL_COMMUNITY): Payer: Self-pay

## 2024-05-26 ENCOUNTER — Other Ambulatory Visit: Payer: Self-pay

## 2024-05-31 ENCOUNTER — Other Ambulatory Visit: Payer: Self-pay

## 2024-06-01 ENCOUNTER — Other Ambulatory Visit: Payer: Self-pay

## 2024-06-02 ENCOUNTER — Other Ambulatory Visit: Payer: Self-pay

## 2024-06-12 LAB — OPHTHALMOLOGY REPORT-SCANNED

## 2024-06-27 ENCOUNTER — Ambulatory Visit: Admitting: Internal Medicine

## 2024-06-27 ENCOUNTER — Other Ambulatory Visit

## 2024-06-27 ENCOUNTER — Encounter: Payer: Self-pay | Admitting: Internal Medicine

## 2024-06-27 VITALS — BP 122/80 | HR 100 | Ht 73.0 in | Wt 194.0 lb

## 2024-06-27 DIAGNOSIS — Z794 Long term (current) use of insulin: Secondary | ICD-10-CM

## 2024-06-27 DIAGNOSIS — E7800 Pure hypercholesterolemia, unspecified: Secondary | ICD-10-CM

## 2024-06-27 DIAGNOSIS — Z7984 Long term (current) use of oral hypoglycemic drugs: Secondary | ICD-10-CM

## 2024-06-27 DIAGNOSIS — Z7985 Long-term (current) use of injectable non-insulin antidiabetic drugs: Secondary | ICD-10-CM

## 2024-06-27 DIAGNOSIS — E1142 Type 2 diabetes mellitus with diabetic polyneuropathy: Secondary | ICD-10-CM

## 2024-06-27 DIAGNOSIS — E1165 Type 2 diabetes mellitus with hyperglycemia: Secondary | ICD-10-CM

## 2024-06-27 DIAGNOSIS — E039 Hypothyroidism, unspecified: Secondary | ICD-10-CM

## 2024-06-27 LAB — POCT GLYCOSYLATED HEMOGLOBIN (HGB A1C): Hemoglobin A1C: 10 % — AB (ref 4.0–5.6)

## 2024-06-27 MED ORDER — TOUJEO SOLOSTAR 300 UNIT/ML ~~LOC~~ SOPN: 42.0000 [IU] | PEN_INJECTOR | Freq: Every day | SUBCUTANEOUS | Status: AC

## 2024-06-27 MED ORDER — HUMALOG KWIKPEN 200 UNIT/ML ~~LOC~~ SOPN
14.0000 [IU] | PEN_INJECTOR | Freq: Three times a day (TID) | SUBCUTANEOUS | Status: AC
Start: 2024-06-27 — End: ?

## 2024-06-27 NOTE — Progress Notes (Signed)
 Patient ID: Diana Fleming, female   DOB: 12/21/1965, 58 y.o.   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  She was previously seen by Dr. Kassie who was managing her hypothyroidism. Last visit with me 4 months ago.  Interim history: + increased urination, + blurry vision, no nausea (after stopping Ozempic  3 months ago), no chest pain.  Hypothyroidis: - dx'ed in 2010, after radioactive iodine ablation >> on generic levothyroxine .  Pt takes the levothyroxine  112 mcg daily, decreased 01/2024: - in am - fasting - at least 30 min from b'fast - no calcium  - no iron - + off and on multivitamins >4h later - no PPIs - not on Biotin  I reviewed pt's thyroid  tests: Lab Results  Component Value Date   TSH 0.37 (L) 02/21/2024   TSH 0.12 (L) 11/04/2023   TSH 3.26 10/26/2022   TSH 7.17 (H) 08/21/2022   TSH 4.87 06/03/2022   TSH 0.50 11/06/2021   TSH 0.59 05/09/2021   TSH 0.017 (L) 02/05/2021   TSH 0.377 (L) 12/25/2020   TSH 0.045 (L) 09/18/2020   FREET4 1.7 02/21/2024   FREET4 1.8 11/04/2023   FREET4 1.01 10/26/2022   FREET4 1.35 11/06/2021   FREET4 1.04 05/09/2021   FREET4 1.55 12/25/2020   Antithyroid antibodies: No results found for: THGAB No components found for: TPOAB  Pt denies feeling nodules in neck, hoarseness, dysphagia/odynophagia.  She has + FH of thyroid  disorders in: mother. No FH of thyroid  cancer.  No h/o radiation tx to head or neck exc. RAI tx. No recent use of iodine supplements.  Uncontrolled type 2 diabetes:  Reviewed HbA1c: Lab Results  Component Value Date   HGBA1C 9.4 (A) 02/21/2024   HGBA1C 10.4 11/04/2023   HGBA1C 10.8 (A) 08/06/2023   HGBA1C 9.6 (A) 04/26/2023   HGBA1C 10.8 (A) 01/11/2023   HGBA1C 11.3 (A) 10/26/2022   HGBA1C 12.0 (H) 08/20/2022   HGBA1C 10.6 (H) 06/03/2022   HGBA1C 9.4 (A) 04/17/2022   HGBA1C 9.7  (H) 01/09/2022   Prev. on: - Metformin  1000 mg 2x a day, with meals >> 1x a day - Farxiga  10 mg in a.m. >> stopped b/c not covered - Ozempic  0.5 mg weekly >> stopped b/c not covered - Semglee  30 >> 45 units at bedtime,  increased 07/2022  At last visit she was on: - Metformin  1000 mg 2x a day with meals - some nausea - 45 units at night >> off for ~2 mo >> restarted Basaglar  30 >> 45 >>  off >> just restarted 30 units daily - Humalog  8-12 >> 15-18 units >> still using 12 units 15 min before the 3 main meals >> 14-18 units 15 min >>  before the 3 main meals (actually using 12) >> 14-18 units 15 min before the 2 main meals >> off - Ozempic  0.25 >> 0.5 mg weekly in a.m. >> stopped as she ran out and also had diarrhea >> restarted 0.5 mg weekly  I advised her to use the following regimen: - Metformin  ER 1000 >> 500 mg 2x a day with meals - decreased due to nausea in am - Ozempic  0.5 mg weekly >> stopped after 4 weeks due to nausea  - Basaglar  36 units at night - Humalog  U200 15 min before meals 14 units before a l meal   Pt is checking CBGs 0-1x a day: - am: 95,  172-209, 275 >> n/c >> 151, 179-251 >> 187-247 - 2h after b'fast: 258, 330 >> 254 >> 373 >> n/c >> 145-345 - before lunch: n/c >> 116-241 >> 176-310 >> 172-373 - 2h after lunch:  n/c >> 320, 345 >> 341 >> 192-294 >> 148-276 - before dinner: n/c >> 151, 175 >> n/c >> 237-292 >> 192-305 - 2h after dinner: 320, 349 >> n/c >> 183-347 >> 200, 275 - bedtime: n/c - nighttime: n/c Lowest sugar was 95 >> 140 >> 151 >> 145 Highest sugar was 426 >> .SABRA. 347 >> 345  Glucometer: One Touch >> Reli[employer]  She tries to stay away from fried food, but she works in a [employer] and is not always able to do so.  - no CKD, last BUN/creatinine:  Lab Results  Component Value Date   BUN 10 08/06/2023   BUN 8 04/26/2023   CREATININE 0.82 08/06/2023   CREATININE 0.76 04/26/2023   Lab Results  Component Value Date   MICRALBCREAT 13  10/16/2020   - + HL; last set of lipids: Lab Results  Component Value Date   CHOL 110 02/21/2024   HDL 37 (L) 02/21/2024   LDLCALC 51 02/21/2024   TRIG 140 02/21/2024   CHOLHDL 3.0 02/21/2024  On Lipitor 80 mg daily  - last eye exam was on 06/12/2024. No DR.  - + numbness and tingling in her feet.  Last foot exam 11/04/2023.  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    Hypertension    Hypothyroidism    Pure hypercholesterolemia 05/11/2019   Past Surgical History:  Procedure Laterality Date   DILATION AND CURETTAGE, DIAGNOSTIC / THERAPEUTIC  2002   Radioiodine ablation     TUBAL LIGATION     Social History   Socioeconomic History   Marital status: Married    Spouse name: Not on file   Number of children: Not on file   Years of education: Not on file   Highest education level: GED or equivalent  Occupational History   Not on file  Tobacco Use   Smoking status: Never   Smokeless tobacco: Never  Vaping Use   Vaping status: Never Used  Substance and Sexual Activity   Alcohol use: Never   Drug use: Never   Sexual activity: Yes  Other Topics Concern   Not on file  Social History Narrative   Not on file   Social Drivers of Health   Financial Resource Strain: High Risk (08/03/2023)   Overall Financial Resource Strain (CARDIA)    Difficulty of Paying Living Expenses: Very hard  Food Insecurity: Unknown (08/03/2023)   Hunger Vital Sign    Worried About Running Out of Food in the Last Year: Never true    Ran Out of Food in the Last Year: Patient declined  Transportation Needs: No Transportation Needs (08/03/2023)   PRAPARE - Administrator, Civil Service (Medical): No    Lack of Transportation (Non-Medical): No  Physical Activity: Unknown (08/03/2023)   Exercise Vital Sign    Days of  Exercise per Week: 0 days    Minutes of Exercise per Session: Not on file  Stress: No Stress Concern Present (08/03/2023)   [employer] of Occupational Health - Occupational Stress Questionnaire    Feeling of  Stress : Not at all  Social Connections: Moderately Integrated (08/03/2023)   Social Connection and Isolation Panel    Frequency of Communication with Friends and Family: Twice a week    Frequency of Social Gatherings with Friends and Family: Twice a week    Attends Religious Services: More than 4 times per year    Active Member of [employer] or Organizations: No    Attends Engineer, Structural: Not on file    Marital Status: Married  Catering Manager Violence: Not At Risk (08/06/2023)   Humiliation, Afraid, Rape, and Kick questionnaire    Fear of Current or Ex-Partner: No    Emotionally Abused: No    Physically Abused: No    Sexually Abused: No   Current Outpatient Medications on File Prior to Visit  Medication Sig Dispense Refill   amLODipine  (NORVASC ) 10 MG tablet Take 1 tablet (10 mg total) by mouth daily. 90 tablet 1   atorvastatin  (LIPITOR) 80 MG tablet Take 1 tablet (80 mg total) by mouth daily at 6 PM. 90 tablet 1   Blood Glucose Monitoring Suppl (FREESTYLE LITE) w/Device KIT Use as instructed to test blood sugar 2 times daily, in the morning and the evening (Patient not taking: Reported on 12/24/2023) 1 kit 0   Blood Glucose Monitoring Suppl (TRUE METRIX METER) w/Device KIT Use as instructed. Check blood glucose level by fingerstick three times per day. E11.65 1 kit 0   glucose blood test strip Use as instructed. Check blood glucose level by fingerstick three times per day. 200 each 12   insulin  glargine, 1 Unit Dial , (TOUJEO  SOLOSTAR) 300 UNIT/ML Solostar Pen Inject 36 Units into the skin daily. 4.5 mL 2   insulin  lispro (HUMALOG  KWIKPEN) 200 UNIT/ML KwikPen Inject 10-14 Units into the skin 3 (three) times daily.     insulin  lispro (HUMALOG  KWIKPEN) 200 UNIT/ML  KwikPen Inject 8-12 Units into the skin 3 (three) times daily. 30 mL 3   Insulin  Pen Needle 32G X 4 MM MISC Use 4 (four) times daily. 300 each 3   Insulin  Syringe-Needle U-100 (INSULIN  SYRINGES) 31G X 5/16 0.5 ML MISC Use as instructed. Inject into the skin once nightly. 100 each 6   levothyroxine  (SYNTHROID ) 112 MCG tablet Take 1 tablet (112 mcg total) by mouth daily. 45 tablet 3   losartan -hydrochlorothiazide  (HYZAAR ) 100-25 MG tablet Take 1 tablet by mouth daily. 90 tablet 2   metFORMIN  (GLUCOPHAGE -XR) 500 MG 24 hr tablet Take 2 tablets (1,000 mg total) by mouth 2 (two) times daily with a meal. 360 tablet 3   TRUEplus Lancets 28G MISC Use as instructed. Check blood glucose level by fingerstick three times per day. E11.65 200 each 3   No current facility-administered medications on file prior to visit.   No Known Allergies Family History  Problem Relation Age of Onset   Cancer Mother    Thyroid  disease Mother    Heart attack Maternal Grandfather    Heart attack Maternal Grandmother    PE: LMP 12/05/2012  Wt Readings from Last 10 Encounters:  02/21/24 191 lb 6.4 oz (86.8 kg)  12/24/23 191 lb 6.4 oz (86.8 kg)  11/04/23 192 lb (87.1 kg)  09/19/23 200 lb (90.7 kg)  09/10/23 199 lb 9.6 oz (90.5 kg)  08/06/23 202 lb 9.6 oz (91.9 kg)  05/06/23 204 lb 12.8 oz (92.9 kg)  04/26/23 206 lb (93.4 kg)  01/20/23 205 lb 6.4 oz (93.2 kg)  01/11/23 202 lb 6.4 oz (91.8 kg)  Constitutional: Slightly overweight, in NAD Eyes:  EOMI, no exophthalmos ENT: no neck masses, no cervical lymphadenopathy Cardiovascular: RRR, No MRG Respiratory: CTA B Musculoskeletal: no deformities except enlarged left clavicle head Skin:no rashes Neurological: no tremor with outstretched hands  ASSESSMENT: 1.  Postablation hypothyroidism  2.  Type 2 diabetes, uncontrolled, with complications - PN  3. HL  PLAN:  1. Patient with longstanding hypothyroidism, on levothyroxine  therapy - latest thyroid  labs  reviewed with pt. >> TSH is suppressed: Lab Results  Component Value Date   TSH 0.37 (L) 02/21/2024  - she continues on LT4 112 mcg daily, dose decreased after the above results returned.  He did not return for labs after this dose change. - pt feels good on this dose. - we discussed about taking the thyroid  hormone every day, with water, >30 minutes before breakfast, separated by >4 hours from acid reflux medications, calcium , iron, multivitamins. Pt. is taking it correctly. - will check thyroid  tests today: TSH and fT4 - If labs are abnormal, she will need to return for repeat TFTs in 1.5 months  2.  Uncontrolled type 2 diabetes -Patient with history of uncontrolled type 2 diabetes, with improved control at last visit, after starting Ozempic , however, HbA1c was still quite high at that time, at 9.4%, as she misunderstood instructions and stopped her insulin  when starting Ozempic .  She did not restart Basaglar  3-week days prior to the appointment as advised by the pharmacist. I advised her to restart mealtime insulin  and increase her Basaglar  dose.  Since she had nausea with the IR metformin , I advised her to switch to the ER formulation. - At today's visit, sugars are very high mostly in the 200s and 300s.  Upon questioning, she reduced the dose of metformin  due to nausea.  She also came off Ozempic  due to nausea.  We discussed about possibly putting the metformin  dose in the evening, as she only has nausea after her morning dose.  She agrees to try this.  I advised her that if she could not tolerate 2 tablets, to decrease the dose to 1 tablet with dinner.  We also discussed about possibly restarting Ozempic  and microdosing it.  She agrees with this.  I also advised her to increase the dose of both of her insulins and changed the injection sites to the upper thighs.  She is currently only using the abdomen. -I advised her to: Patient Instructions  Please continue Levothyroxine  112 mcg daily.  Take  the thyroid  hormone every day, with water, at least 30 minutes before breakfast, separated by at least 4 hours from: - acid reflux medications - calcium  - iron - multivitamins  Please change: - Metformin  ER 1000 mg with dinner  Try to restart: - Ozempic  0.125 mg weekly   On the Ozempic  0.5 mg pen: -  9 clicks  - 0.125 mg - 18 clicks - 0.25 mg - 27 clicks - 0.375 mg - 36 clicks - 0.5 mg   Change injection sites to the upper thighs.  Increase: - Basaglar   42 units at night - Humalog  15 min before meals:  14-18 units   Please return in 2-3 months.  - we checked her HbA1c: 10% (even higher) - advised to check sugars at different times of the day - 4x a day, rotating check times - advised for yearly eye exams >> she is UTD - will check an ACR and CMP today - return to clinic in 2-3 months  3. HL - Latest lipid  panel from 01/2024 shows a slightly low HDL, otherwise fractions at goal: Lab Results  Component Value Date   CHOL 110 02/21/2024   HDL 37 (L) 02/21/2024   LDLCALC 51 02/21/2024   TRIG 140 02/21/2024   CHOLHDL 3.0 02/21/2024  - Continues atorvastatin  80 mg daily without side effects  Orders Placed This Encounter  Procedures   Microalbumin / creatinine urine ratio   TSH   T4, free   Comprehensive metabolic panel with GFR   Lela Fendt, MD PhD [facility] Endocrinology

## 2024-06-27 NOTE — Addendum Note (Signed)
 Addended by: CLEOTILDE ROLIN RAMAN on: 06/27/2024 09:39 AM   Modules accepted: Orders

## 2024-06-27 NOTE — Patient Instructions (Addendum)
 Please continue Levothyroxine  112 mcg daily.  Take the thyroid  hormone every day, with water, at least 30 minutes before breakfast, separated by at least 4 hours from: - acid reflux medications - calcium  - iron - multivitamins  Please change: - Metformin  ER 1000 mg with dinner  Try to restart: - Ozempic  0.125 mg weekly   On the Ozempic  0.5 mg pen: -  9 clicks  - 0.125 mg - 18 clicks - 0.25 mg - 27 clicks - 0.375 mg - 36 clicks - 0.5 mg   Change injection sites to the upper thighs.  Increase: - Basaglar   42 units at night - Humalog  15 min before meals:  14-18 units   Please return in 2-3 months.

## 2024-06-28 ENCOUNTER — Ambulatory Visit: Payer: Self-pay | Admitting: Internal Medicine

## 2024-06-28 ENCOUNTER — Other Ambulatory Visit: Payer: Self-pay

## 2024-06-28 LAB — COMPREHENSIVE METABOLIC PANEL WITH GFR
AG Ratio: 1.5 (calc) (ref 1.0–2.5)
ALT: 31 U/L — ABNORMAL HIGH (ref 6–29)
AST: 15 U/L (ref 10–35)
Albumin: 4.5 g/dL (ref 3.6–5.1)
Alkaline phosphatase (APISO): 87 U/L (ref 37–153)
BUN: 14 mg/dL (ref 7–25)
CO2: 28 mmol/L (ref 20–32)
Calcium: 9.8 mg/dL (ref 8.6–10.4)
Chloride: 98 mmol/L (ref 98–110)
Creat: 0.8 mg/dL (ref 0.50–1.03)
Globulin: 3 g/dL (ref 1.9–3.7)
Glucose, Bld: 230 mg/dL — ABNORMAL HIGH (ref 65–99)
Potassium: 3.4 mmol/L — ABNORMAL LOW (ref 3.5–5.3)
Sodium: 137 mmol/L (ref 135–146)
Total Bilirubin: 0.6 mg/dL (ref 0.2–1.2)
Total Protein: 7.5 g/dL (ref 6.1–8.1)
eGFR: 86 mL/min/1.73m2 (ref >=60)

## 2024-06-28 LAB — TSH: TSH: 2.48 m[IU]/L (ref 0.40–4.50)

## 2024-06-28 LAB — MICROALBUMIN / CREATININE URINE RATIO
Creatinine, Urine: 159 mg/dL (ref 20–275)
Microalb Creat Ratio: 10 mg/g{creat} (ref <30)
Microalb, Ur: 1.6 mg/dL

## 2024-06-28 LAB — T4, FREE: Free T4: 1.3 ng/dL (ref 0.8–1.8)

## 2024-06-28 MED ORDER — LEVOTHYROXINE SODIUM 112 MCG PO TABS
112.0000 ug | ORAL_TABLET | Freq: Every day | ORAL | 3 refills | Status: AC
  Filled 2024-06-28 (×2): qty 90, 90d supply, fill #0
  Filled 2024-10-03: qty 90, 90d supply, fill #1

## 2024-06-28 NOTE — Addendum Note (Signed)
 Addended by: TRIXIE FILE on: 06/28/2024 12:29 PM   Modules accepted: Orders

## 2024-07-06 ENCOUNTER — Other Ambulatory Visit: Payer: Self-pay

## 2024-07-07 ENCOUNTER — Other Ambulatory Visit: Payer: Self-pay

## 2024-07-24 ENCOUNTER — Other Ambulatory Visit: Payer: Self-pay

## 2024-08-04 ENCOUNTER — Other Ambulatory Visit: Payer: Self-pay

## 2024-09-07 ENCOUNTER — Other Ambulatory Visit: Payer: Self-pay

## 2024-09-07 ENCOUNTER — Other Ambulatory Visit: Payer: Self-pay | Admitting: Nurse Practitioner

## 2024-09-07 DIAGNOSIS — E785 Hyperlipidemia, unspecified: Secondary | ICD-10-CM

## 2024-09-07 DIAGNOSIS — I10 Essential (primary) hypertension: Secondary | ICD-10-CM

## 2024-09-13 ENCOUNTER — Other Ambulatory Visit: Payer: Self-pay | Admitting: Medical Genetics

## 2024-09-13 ENCOUNTER — Ambulatory Visit: Admitting: Internal Medicine

## 2024-09-13 NOTE — Progress Notes (Unsigned)
 Patient ID: Diana Fleming, female   DOB: [birth year], 59 y.o.   MRN: [id number]  HPI  Diana Fleming is a 59 y.o.-year-old female, returning for follow up postablative hypothyroidism and uncontrolled type 2 diabetes with PN.  She was previously seen by Dr. Kassie. Last visit with me 2.5 months ago.  Interim history: She continues to have increased urination and blurry vision.  She had nausea at last visit but this resolved.   Hypothyroidis: - dx'ed in 2010, after radioactive iodine ablation >> on generic levothyroxine .  Pt takes the levothyroxine  112 mcg daily, decreased 01/2024: - in am - fasting - at least 30 min from b'fast - no calcium  - no iron - + off and on multivitamins >4h later - no PPIs - not on Biotin  I reviewed pt's thyroid  tests: Lab Results  Component Value Date   TSH 2.48 06/27/2024   TSH 0.37 (L) 02/21/2024   TSH 0.12 (L) 11/04/2023   TSH 3.26 10/26/2022   TSH 7.17 (H) 08/21/2022   TSH 4.87 06/03/2022   TSH 0.50 11/06/2021   TSH 0.59 05/09/2021   TSH 0.017 (L) 02/05/2021   TSH 0.377 (L) 12/25/2020   FREET4 1.3 06/27/2024   FREET4 1.7 02/21/2024   FREET4 1.8 11/04/2023   FREET4 1.01 10/26/2022   FREET4 1.35 11/06/2021   FREET4 1.04 05/09/2021   FREET4 1.55 12/25/2020   Antithyroid antibodies: No results found for: THGAB No components found for: TPOAB  Pt denies feeling nodules in neck, hoarseness, dysphagia/odynophagia.  She has + FH of thyroid  disorders in: mother. No FH of thyroid  cancer.  No h/o radiation tx to head or neck exc. RAI tx. No recent use of iodine supplements.  Uncontrolled type 2 diabetes:  Reviewed HbA1c: Lab Results  Component Value Date   HGBA1C 10.0 (A) 06/27/2024   HGBA1C 9.4 (A) 02/21/2024   HGBA1C 10.4 11/04/2023   HGBA1C 10.8 (A) 08/06/2023   HGBA1C 9.6 (A) 04/26/2023   HGBA1C 10.8 (A) 01/11/2023   HGBA1C 11.3 (A) 10/26/2022   HGBA1C 12.0 (H) 08/20/2022   HGBA1C 10.6 (H) 06/03/2022   HGBA1C 9.4 (A)  04/17/2022   At last visit she was on: - Metformin  ER 1000 >> 500 mg 2x a day with meals - decreased due to nausea in am - Ozempic  0.5 mg weekly >> stopped after 4 weeks due to nausea  - Basaglar  36 units at night - Humalog  U200 15 min before meals 14 units before a l meal   I advised her to use the following regimen: - Metformin  ER 1000 mg with dinner - Ozempic  0.125 mg weekly (injects in upper thighs) - Basaglar   42 units at night - Humalog  15 min before meals:  14-18 units   Pt is checking CBGs 0-1x a day: - am: 95, 172-209, 275 >> n/c >> 151, 179-251 >> 187-247 - 2h after b'fast: 258, 330 >> 254 >> 373 >> n/c >> 145-345 - before lunch: n/c >> 116-241 >> 176-310 >> 172-373 - 2h after lunch:  n/c >> 320, 345 >> 341 >> 192-294 >> 148-276 - before dinner: n/c >> 151, 175 >> n/c >> 237-292 >> 192-305 - 2h after dinner: 320, 349 >> n/c >> 183-347 >> 200, 275 - bedtime: n/c - nighttime: n/c Lowest sugar was 95 >> 140 >> 151 >> 145 Highest sugar was 426 >> .SABRA. 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  - no CKD, last BUN/creatinine:  Lab Results  Component Value Date   BUN 14 06/27/2024   BUN 10 08/06/2023   CREATININE 0.80 06/27/2024   CREATININE 0.82 08/06/2023   Lab Results  Component Value Date   MICRALBCREAT 10 06/27/2024   MICRALBCREAT 13 10/16/2020   - + HL; last set of lipids: Lab Results  Component Value Date   CHOL 110 02/21/2024   HDL 37 (L) 02/21/2024   LDLCALC 51 02/21/2024   TRIG 140 02/21/2024   CHOLHDL 3.0 02/21/2024  On Lipitor 80 mg daily  - last eye exam was on 06/12/2024. No DR.  - + numbness and tingling in her feet.  Last foot exam 11/04/2023.  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    Hypertension    Hypothyroidism    Pure hypercholesterolemia 05/11/2019   Past Surgical History:  Procedure Laterality Date   DILATION AND CURETTAGE, DIAGNOSTIC / THERAPEUTIC  2002   Radioiodine ablation     TUBAL LIGATION     Social History   Socioeconomic History   Marital status: Married    Spouse name: Not on file   Number of children: Not on file   Years of education: Not on file   Highest education level: GED or equivalent  Occupational History   Not on file  Tobacco Use   Smoking status: Never   Smokeless tobacco: Never  Vaping Use   Vaping status: Never Used  Substance and Sexual Activity   Alcohol use: Never   Drug use: Never   Sexual activity: Yes  Other Topics Concern   Not on file  Social History Narrative   Not on file   Social Drivers of Health   Tobacco Use: Low Risk (06/27/2024)   Patient History    Smoking Tobacco Use: Never    Smokeless Tobacco Use: Never    Passive Exposure: Not on file  Financial Resource Strain: High Risk (08/03/2023)   Overall Financial Resource Strain (CARDIA)    Difficulty of Paying Living Expenses: Very hard  Food Insecurity: Unknown (08/03/2023)   Hunger Vital Sign    Worried About Running Out of Food in the Last Year: Never true    Ran Out of Food in the Last Year: Patient declined  Transportation Needs: No Transportation Needs (08/03/2023)   PRAPARE - Administrator, Civil Service (Medical): No    Lack of Transportation (Non-Medical): No  Physical Activity: Unknown (08/03/2023)   Exercise Vital Sign    Days of Exercise per Week: 0 days    Minutes of Exercise per Session: Not on file  Stress: No Stress Concern Present (08/03/2023)   [employer] of Occupational Health - Occupational Stress Questionnaire    Feeling of Stress : Not at all  Social Connections: Moderately Integrated (08/03/2023)   Social Connection and Isolation Panel    Frequency of Communication with  Friends and Family: Twice a week    Frequency of Social Gatherings with Friends and Family: Twice a week    Attends Religious Services: More than 4 times per year    Active Member of [employer] or Organizations: No    Attends Banker Meetings: Not on file    Marital  Status: Married  Catering Manager Violence: Not At Risk (08/06/2023)   Humiliation, Afraid, Rape, and Kick questionnaire    Fear of Current or Ex-Partner: No    Emotionally Abused: No    Physically Abused: No    Sexually Abused: No  Depression (PHQ2-9): Low Risk (12/24/2023)   Depression (PHQ2-9)    PHQ-2 Score: 1  Alcohol Screen: Not on file  Housing: Low Risk (08/03/2023)   Housing    Last Housing Risk Score: 0  Utilities: Not At Risk (08/06/2023)   AHC Utilities    Threatened with loss of utilities: No  Health Literacy: Adequate Health Literacy (08/06/2023)   B1300 Health Literacy    Frequency of need for help with medical instructions: Never   Current Outpatient Medications on File Prior to Visit  Medication Sig Dispense Refill   amLODipine  (NORVASC ) 10 MG tablet Take 1 tablet (10 mg total) by mouth daily. 90 tablet 1   atorvastatin  (LIPITOR) 80 MG tablet Take 1 tablet (80 mg total) by mouth daily at 6 PM. 90 tablet 1   Blood Glucose Monitoring Suppl (FREESTYLE LITE) w/Device KIT Use as instructed to test blood sugar 2 times daily, in the morning and the evening 1 kit 0   Blood Glucose Monitoring Suppl (TRUE METRIX METER) w/Device KIT Use as instructed. Check blood glucose level by fingerstick three times per day. E11.65 1 kit 0   glucose blood test strip Use as instructed. Check blood glucose level by fingerstick three times per day. 200 each 12   insulin  glargine, 1 Unit Dial , (TOUJEO  SOLOSTAR) 300 UNIT/ML Solostar Pen Inject 42 Units into the skin daily.     insulin  lispro (HUMALOG  KWIKPEN) 200 UNIT/ML KwikPen Inject 14-18 Units into the skin 3 (three) times daily.     Insulin  Pen Needle 32G X 4 MM MISC Use 4  (four) times daily. 300 each 3   Insulin  Syringe-Needle U-100 (INSULIN  SYRINGES) 31G X 5/16 0.5 ML MISC Use as instructed. Inject into the skin once nightly. 100 each 6   levothyroxine  (SYNTHROID ) 112 MCG tablet Take 1 tablet (112 mcg total) by mouth daily. 90 tablet 3   losartan -hydrochlorothiazide  (HYZAAR ) 100-25 MG tablet Take 1 tablet by mouth daily. 90 tablet 2   metFORMIN  (GLUCOPHAGE -XR) 500 MG 24 hr tablet Take 2 tablets (1,000 mg total) by mouth 2 (two) times daily with a meal. 360 tablet 3   TRUEplus Lancets 28G MISC Use as instructed. Check blood glucose level by fingerstick three times per day. E11.65 200 each 3   No current facility-administered medications on file prior to visit.   No Known Allergies Family History  Problem Relation Age of Onset   Cancer Mother    Thyroid  disease Mother    Heart attack Maternal Grandfather    Heart attack Maternal Grandmother    PE: LMP 12/05/2012  Wt Readings from Last 10 Encounters:  06/27/24 194 lb (88 kg)  02/21/24 191 lb 6.4 oz (86.8 kg)  12/24/23 191 lb 6.4 oz (86.8 kg)  11/04/23 192 lb (87.1 kg)  09/19/23 200 lb (90.7 kg)  09/10/23 199 lb 9.6 oz (90.5 kg)  08/06/23 202 lb 9.6 oz (91.9 kg)  05/06/23 204 lb 12.8 oz (92.9 kg)  04/26/23 206 lb (93.4 kg)  01/20/23 205 lb 6.4 oz (93.2 kg)   Constitutional: Slightly overweight, in NAD Eyes:  EOMI, no exophthalmos ENT: no neck masses, no cervical lymphadenopathy Cardiovascular: RRR, No MRG Respiratory: CTA B Musculoskeletal: no deformities except enlarged left clavicle head  Skin:no rashes Neurological: no tremor with outstretched hands  ASSESSMENT: 1.  Postablation hypothyroidism  2.  Type 2 diabetes, uncontrolled, with complications - PN  3. HL  PLAN:  1. Patient with longstanding hypothyroidism, on levothyroxine  therapy - latest thyroid  labs reviewed with pt. >> normal at last visit: Lab Results  Component Value Date   TSH 2.48 06/27/2024  - she continues on LT4  112 mcg daily - pt feels good on this dose. - we discussed about taking the thyroid  hormone every day, with water, >30 minutes before breakfast, separated by >4 hours from acid reflux medications, calcium , iron, multivitamins. Pt. is taking it correctly.   2.  Uncontrolled type 2 diabetes -Patient with history of uncontrolled type 2 diabetes, with improved control starting Ozempic  but at last visit, sugars were extremely high, in the 200s and 300s.  Upon questioning, she reduced the dose of metformin  due to nausea and also came off Ozempic  for the same reason.  She described nausea after taking metformin  in the morning.  We discussed about trying to take it at night and also restarting the lower dose of Ozempic  and microdosing it up.  She agreed with this.  I also advised her to increase both of her insulin  doses and change the injection sites to the upper thighs as she was only using the abdomen. HbA1c at last visit was about higher than before, at 10.0%.  -I advised her to: Patient Instructions  Please continue Levothyroxine  112 mcg daily.  Take the thyroid  hormone every day, with water, at least 30 minutes before breakfast, separated by at least 4 hours from: - acid reflux medications - calcium  - iron - multivitamins  Please continue: - Metformin  ER 1000 mg with dinner - Ozempic  0.125 mg weekly (injects in upper thighs) - Basaglar   42 units at night - Humalog  15 min before meals:  14-18 units   On the Ozempic  0.5 mg pen: -  9 clicks  - 0.125 mg - 18 clicks - 0.25 mg - 27 clicks - 0.375 mg - 36 clicks - 0.5 mg   Please return in 3 months.  - we checked her HbA1c: 7%  - advised to check sugars at different times of the day - 4x a day, rotating check times - advised for yearly eye exams >> she is UTD - return to clinic in 3 months  3. HL - Lipid panel showed fractions at goal except for slightly low HDL: Lab Results  Component Value Date   CHOL 110 02/21/2024   HDL 37 (L)  02/21/2024   LDLCALC 51 02/21/2024   TRIG 140 02/21/2024   CHOLHDL 3.0 02/21/2024  - She continues on atorvastatin  80 mg daily without side effects  Lela Fendt, MD PhD [facility]

## 2024-09-18 ENCOUNTER — Other Ambulatory Visit: Payer: Self-pay

## 2024-09-19 ENCOUNTER — Telehealth: Payer: Self-pay | Admitting: Nurse Practitioner

## 2024-09-19 NOTE — Telephone Encounter (Signed)
 Contacted pt left vm to confirmed appt

## 2024-09-20 ENCOUNTER — Ambulatory Visit: Payer: Self-pay | Attending: Nurse Practitioner | Admitting: Nurse Practitioner

## 2024-09-20 ENCOUNTER — Encounter: Payer: Self-pay | Admitting: Nurse Practitioner

## 2024-09-20 ENCOUNTER — Other Ambulatory Visit: Payer: Self-pay

## 2024-09-20 VITALS — BP 112/70 | HR 92 | Ht 73.0 in | Wt 195.6 lb

## 2024-09-20 DIAGNOSIS — E785 Hyperlipidemia, unspecified: Secondary | ICD-10-CM | POA: Diagnosis not present

## 2024-09-20 DIAGNOSIS — R0789 Other chest pain: Secondary | ICD-10-CM

## 2024-09-20 DIAGNOSIS — Z23 Encounter for immunization: Secondary | ICD-10-CM | POA: Diagnosis not present

## 2024-09-20 DIAGNOSIS — I10 Essential (primary) hypertension: Secondary | ICD-10-CM | POA: Diagnosis not present

## 2024-09-20 DIAGNOSIS — Z1231 Encounter for screening mammogram for malignant neoplasm of breast: Secondary | ICD-10-CM

## 2024-09-20 DIAGNOSIS — Z1211 Encounter for screening for malignant neoplasm of colon: Secondary | ICD-10-CM

## 2024-09-20 MED ORDER — AMLODIPINE BESYLATE 10 MG PO TABS
10.0000 mg | ORAL_TABLET | Freq: Every day | ORAL | 1 refills | Status: AC
  Filled 2024-09-20: qty 90, 90d supply, fill #0

## 2024-09-20 MED ORDER — LOSARTAN POTASSIUM-HCTZ 100-25 MG PO TABS
1.0000 | ORAL_TABLET | Freq: Every day | ORAL | 2 refills | Status: AC
  Filled 2024-09-20 – 2024-10-04 (×2): qty 90, 90d supply, fill #0

## 2024-09-20 MED ORDER — ATORVASTATIN CALCIUM 80 MG PO TABS
80.0000 mg | ORAL_TABLET | Freq: Every day | ORAL | 1 refills | Status: AC
  Filled 2024-09-20: qty 90, 90d supply, fill #0

## 2024-09-20 NOTE — Patient Instructions (Signed)
 [facility] at [facility] in: [facility] Address: [address], [place], [place] [address] Phone: [phone]   [facility] Located in: [facility] Address: [address], [place], [place] [address] Phone: [phone]

## 2024-09-20 NOTE — Progress Notes (Signed)
 "  Assessment & Plan:  Diana Fleming was seen today for medical management of chronic issues.  Diagnoses and all orders for this visit:  Primary hypertension -     amLODipine  (NORVASC ) 10 MG tablet; Take 1 tablet (10 mg total) by mouth daily. -     losartan -hydrochlorothiazide  (HYZAAR ) 100-25 MG tablet; Take 1 tablet by mouth daily. -     Basic metabolic panel with GFR  Hyperlipidemia, unspecified hyperlipidemia type -     atorvastatin  (LIPITOR) 80 MG tablet; Take 1 tablet (80 mg total) by mouth daily at 6 PM.  Need for influenza vaccination -     Flu vaccine trivalent PF, 6mos and older(Flulaval,Afluria,Fluarix,Fluzone)  Atypical chest pain -     Ambulatory referral to Cardiology  Breast cancer screening by mammogram -     MS 3D SCR MAMMO BILAT BR (aka MM); Future  Colon cancer screening -     Cologuard    Patient has been counseled on age-appropriate routine health concerns for screening and prevention. These are reviewed and up-to-date. Referrals have been placed accordingly. Immunizations are up-to-date or declined.    Subjective:   Chief Complaint  Patient presents with   Medical Management of Chronic Issues    Diana Fleming 59 y.o. female presents to office today for follow-up for hypertension.  She is followed by Endocrinology for her Diabetes and Thyroid  disorder.   Patient has been counseled on age-appropriate routine health concerns for screening and prevention. These are reviewed and up-to-date. Referrals have been placed accordingly. Immunizations are up-to-date or declined.     MAMMOGRAM: OVERDUE. Referral placed COLON CANCER SCREENING:Overdue. Cologuard resent.  PAP SMEAR: UTD    HTN Blood pressure is at goal today of less than 130/80.  She is currently taking amlodipine  10 mg daily and Hyzaar  100-25 mg daily BP Readings from Last 3 Encounters:  09/20/24 112/70  06/27/24 122/80  02/21/24 128/70    She is experiencing intermittent right sided chest pain.  Lasting only seconds to minutes.  Occurring with activity and without activity.  Last occurrence yesterday. She was seen a few years ago by Cardiology for palpitations.  She had a coronary CTA 02/02/2018 which showed a calcium  score of 0 and no coronary disease. Her echocardiogram at that time showed an LVEF of 60 to 65% and grade 1 DD. She was also noted to have aortic valve sclerosis with no stenosis.  Lab Results  Component Value Date   CHOL 110 02/21/2024   CHOL 121 05/06/2023   CHOL 127 12/17/2021   Lab Results  Component Value Date   HDL 37 (L) 02/21/2024   HDL 37.30 (L) 05/06/2023   HDL 48 12/17/2021   Lab Results  Component Value Date   LDLCALC 51 02/21/2024   LDLCALC 53 05/06/2023   LDLCALC 64 12/17/2021   Lab Results  Component Value Date   TRIG 140 02/21/2024   TRIG 153.0 (H) 05/06/2023   TRIG 73 12/17/2021   Lab Results  Component Value Date   CHOLHDL 3.0 02/21/2024   CHOLHDL 3 05/06/2023   CHOLHDL 2.6 12/17/2021   No results found for: LDLDIRECT   Review of Systems  Constitutional:  Negative for fever, malaise/fatigue and weight loss.  HENT: Negative.  Negative for nosebleeds.   Eyes: Negative.  Negative for blurred vision, double vision and photophobia.  Respiratory: Negative.  Negative for cough and shortness of breath.   Cardiovascular:  Positive for chest pain. Negative for palpitations and leg swelling.  Gastrointestinal: Negative.  Negative for heartburn, nausea and vomiting.  Musculoskeletal: Negative.  Negative for myalgias.  Neurological: Negative.  Negative for dizziness, focal weakness, seizures and headaches.  Psychiatric/Behavioral: Negative.  Negative for suicidal ideas.     Past Medical History:  Diagnosis Date   Diabetes mellitus, type 2 (HCC)    Dizziness 05/11/2019   Dyslipidemia    Essential hypertension 05/11/2019   Herpes simplex virus (HSV) infection of vagina    Hypertension    Hypothyroidism    Pure hypercholesterolemia  05/11/2019    Past Surgical History:  Procedure Laterality Date   DILATION AND CURETTAGE, DIAGNOSTIC / THERAPEUTIC  2002   Radioiodine ablation     TUBAL LIGATION      Family History  Problem Relation Age of Onset   Cancer Mother    Thyroid  disease Mother    Heart attack Maternal Grandfather    Heart attack Maternal Grandmother     Social History Reviewed with no changes to be made today.   Outpatient Medications Prior to Visit  Medication Sig Dispense Refill   blood glucose meter kit and supplies KIT by Does not apply route daily. Dispense based on patient and insurance preference. Use up to four times daily as directed.     glucose blood test strip Use as instructed. Check blood glucose level by fingerstick three times per day. 200 each 12   insulin  glargine, 1 Unit Dial , (TOUJEO  SOLOSTAR) 300 UNIT/ML Solostar Pen Inject 42 Units into the skin daily.     insulin  lispro (HUMALOG  KWIKPEN) 200 UNIT/ML KwikPen Inject 14-18 Units into the skin 3 (three) times daily.     Insulin  Pen Needle 32G X 4 MM MISC Use 4 (four) times daily. 300 each 3   Insulin  Syringe-Needle U-100 (INSULIN  SYRINGES) 31G X 5/16 0.5 ML MISC Use as instructed. Inject into the skin once nightly. 100 each 6   levothyroxine  (SYNTHROID ) 112 MCG tablet Take 1 tablet (112 mcg total) by mouth daily. 90 tablet 3   metFORMIN  (GLUCOPHAGE -XR) 500 MG 24 hr tablet Take 2 tablets (1,000 mg total) by mouth 2 (two) times daily with a meal. 360 tablet 3   TRUEplus Lancets 28G MISC Use as instructed. Check blood glucose level by fingerstick three times per day. E11.65 200 each 3   amLODipine  (NORVASC ) 10 MG tablet Take 1 tablet (10 mg total) by mouth daily. 90 tablet 1   atorvastatin  (LIPITOR) 80 MG tablet Take 1 tablet (80 mg total) by mouth daily at 6 PM. 90 tablet 1   losartan -hydrochlorothiazide  (HYZAAR ) 100-25 MG tablet Take 1 tablet by mouth daily. 90 tablet 2   Blood Glucose Monitoring Suppl (FREESTYLE LITE) w/Device KIT Use  as instructed to test blood sugar 2 times daily, in the morning and the evening (Patient not taking: Reported on 09/20/2024) 1 kit 0   Blood Glucose Monitoring Suppl (TRUE METRIX METER) w/Device KIT Use as instructed. Check blood glucose level by fingerstick three times per day. E11.65 (Patient not taking: Reported on 09/20/2024) 1 kit 0   No facility-administered medications prior to visit.    Allergies[1]     Objective:    BP 112/70 (BP Location: Left Arm, Patient Position: Sitting, Cuff Size: Normal)   Pulse 92   Ht 6' 1 (1.854 m)   Wt 195 lb 9.6 oz (88.7 kg)   LMP 12/05/2012   SpO2 98%   BMI 25.81 kg/m  Wt Readings from Last 3 Encounters:  09/20/24 195 lb 9.6 oz (88.7 kg)  06/27/24 194  lb (88 kg)  02/21/24 191 lb 6.4 oz (86.8 kg)    Physical Exam Vitals and nursing note reviewed.  Constitutional:      Appearance: She is well-developed.  HENT:     Head: Normocephalic and atraumatic.  Cardiovascular:     Rate and Rhythm: Normal rate and regular rhythm.     Heart sounds: Normal heart sounds. No murmur heard.    No friction rub. No gallop.  Pulmonary:     Effort: Pulmonary effort is normal. No tachypnea or respiratory distress.     Breath sounds: Normal breath sounds. No decreased breath sounds, wheezing, rhonchi or rales.  Chest:     Chest wall: No tenderness.  Musculoskeletal:        General: Normal range of motion.     Cervical back: Normal range of motion.  Skin:    General: Skin is warm and dry.  Neurological:     Mental Status: She is alert and oriented to person, place, and time.     Coordination: Coordination normal.  Psychiatric:        Behavior: Behavior normal. Behavior is cooperative.        Thought Content: Thought content normal.        Judgment: Judgment normal.          Patient has been counseled extensively about nutrition and exercise as well as the importance of adherence with medications and regular follow-up. The patient was given clear  instructions to go to ER or return to medical center if symptoms don't improve, worsen or new problems develop. The patient verbalized understanding.   Follow-up: No follow-ups on file.   Haze LELON Servant, FNP-BC [facility] [place], [place] [phone]   09/20/2024, 10:11 AM     [1] No Known Allergies  "

## 2024-09-21 ENCOUNTER — Ambulatory Visit: Payer: Self-pay | Admitting: Nurse Practitioner

## 2024-09-21 LAB — BASIC METABOLIC PANEL WITH GFR
BUN/Creatinine Ratio: 15 (ref 9–23)
BUN: 15 mg/dL (ref 6–24)
CO2: 24 mmol/L (ref 20–29)
Calcium: 10.2 mg/dL (ref 8.7–10.2)
Chloride: 97 mmol/L (ref 96–106)
Creatinine, Ser: 0.97 mg/dL (ref 0.57–1.00)
Glucose: 148 mg/dL — ABNORMAL HIGH (ref 70–99)
Potassium: 3.7 mmol/L (ref 3.5–5.2)
Sodium: 139 mmol/L (ref 134–144)
eGFR: 69 mL/min/1.73

## 2024-09-28 ENCOUNTER — Ambulatory Visit: Admitting: Internal Medicine

## 2024-09-28 ENCOUNTER — Telehealth: Payer: Self-pay

## 2024-09-28 NOTE — Telephone Encounter (Signed)
 I called and was unable to leave a vm because the mailbox is full.

## 2024-09-28 NOTE — Progress Notes (Unsigned)
 Patient ID: Diana Fleming, female   DOB: May 07, 1966, 59 y.o.   MRN: [id number] This note was precharted 09/13/2024.  HPI  Diana Fleming is a 59 y.o.-year-old female, returning for follow up postablative hypothyroidism and uncontrolled type 2 diabetes with PN.  She was previously seen by Dr. Kassie. Last visit with me 2.5 months ago.  Interim history: She continues to have increased urination and blurry vision.  She had nausea at last visit but this resolved.   Hypothyroidis: - dx'ed in 2010, after radioactive iodine ablation >> on generic levothyroxine .  Pt takes the levothyroxine  112 mcg daily, decreased 01/2024: - in am - fasting - at least 30 min from b'fast - no calcium  - no iron - + off and on multivitamins >4h later - no PPIs - not on Biotin  I reviewed pt's thyroid  tests: Lab Results  Component Value Date   TSH 2.48 06/27/2024   TSH 0.37 (L) 02/21/2024   TSH 0.12 (L) 11/04/2023   TSH 3.26 10/26/2022   TSH 7.17 (H) 08/21/2022   TSH 4.87 06/03/2022   TSH 0.50 11/06/2021   TSH 0.59 05/09/2021   TSH 0.017 (L) 02/05/2021   TSH 0.377 (L) 12/25/2020   FREET4 1.3 06/27/2024   FREET4 1.7 02/21/2024   FREET4 1.8 11/04/2023   FREET4 1.01 10/26/2022   FREET4 1.35 11/06/2021   FREET4 1.04 05/09/2021   FREET4 1.55 12/25/2020   Antithyroid antibodies: No results found for: THGAB No components found for: TPOAB  Pt denies feeling nodules in neck, hoarseness, dysphagia/odynophagia.  She has + FH of thyroid  disorders in: mother. No FH of thyroid  cancer.  No h/o radiation tx to head or neck exc. RAI tx. No recent use of iodine supplements.  Uncontrolled type 2 diabetes:  Reviewed HbA1c: Lab Results  Component Value Date   HGBA1C 10.0 (A) 06/27/2024   HGBA1C 9.4 (A) 02/21/2024   HGBA1C 10.4 11/04/2023   HGBA1C 10.8 (A) 08/06/2023   HGBA1C 9.6 (A) 04/26/2023   HGBA1C 10.8 (A) 01/11/2023   HGBA1C 11.3 (A) 10/26/2022   HGBA1C 12.0 (H) 08/20/2022   HGBA1C 10.6  (H) 06/03/2022   HGBA1C 9.4 (A) 04/17/2022   At last visit she was on: - Metformin  ER 1000 >> 500 mg 2x a day with meals - decreased due to nausea in am - Ozempic  0.5 mg weekly >> stopped after 4 weeks due to nausea  - Basaglar  36 units at night - Humalog  U200 15 min before meals 14 units before a l meal   I advised her to use the following regimen: - Metformin  ER 1000 mg with dinner - Ozempic  0.125 mg weekly (injects in upper thighs) - Basaglar   42 units at night - Humalog  15 min before meals:  14-18 units   Pt is checking CBGs 0-1x a day: - am: 95, 172-209, 275 >> n/c >> 151, 179-251 >> 187-247 - 2h after b'fast: 258, 330 >> 254 >> 373 >> n/c >> 145-345 - before lunch: n/c >> 116-241 >> 176-310 >> 172-373 - 2h after lunch:  n/c >> 320, 345 >> 341 >> 192-294 >> 148-276 - before dinner: n/c >> 151, 175 >> n/c >> 237-292 >> 192-305 - 2h after dinner: 320, 349 >> n/c >> 183-347 >> 200, 275 - bedtime: n/c - nighttime: n/c Lowest sugar was 95 >> 140 >> 151 >> 145 Highest sugar was 426 >> .SABRA. 347 >> 345  Glucometer: One Touch >> [employer]  She tries to stay away from fried food,  but she works in a [employer] and is not always able to do so.  - no CKD, last BUN/creatinine:  Lab Results  Component Value Date   BUN 15 09/20/2024   BUN 14 06/27/2024   CREATININE 0.97 09/20/2024   CREATININE 0.80 06/27/2024   Lab Results  Component Value Date   MICRALBCREAT 10 06/27/2024   MICRALBCREAT 13 10/16/2020   - + HL; last set of lipids: Lab Results  Component Value Date   CHOL 110 02/21/2024   HDL 37 (L) 02/21/2024   LDLCALC 51 02/21/2024   TRIG 140 02/21/2024   CHOLHDL 3.0 02/21/2024  On Lipitor 80 mg daily  - last eye exam was on 06/12/2024. No DR.  - + numbness and tingling in her feet.  Last foot exam 11/04/2023.  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    Hypertension    Hypothyroidism    Pure hypercholesterolemia 05/11/2019   Past Surgical History:  Procedure Laterality Date   DILATION AND CURETTAGE, DIAGNOSTIC / THERAPEUTIC  2002   Radioiodine ablation     TUBAL LIGATION     Social History   Socioeconomic History   Marital status: Married    Spouse name: Not on file   Number of children: Not on file   Years of education: Not on file   Highest education level: GED or equivalent  Occupational History   Not on file  Tobacco Use   Smoking status: Never   Smokeless tobacco: Never  Vaping Use   Vaping status: Never Used  Substance and Sexual Activity   Alcohol use: Never   Drug use: Never   Sexual activity: Yes  Other Topics Concern   Not on file  Social History Narrative   Not on file   Social Drivers of Health   Tobacco Use: Low Risk (09/20/2024)   Patient History    Smoking Tobacco Use: Never    Smokeless Tobacco Use: Never    Passive Exposure: Not on file  Financial Resource Strain: High Risk (08/03/2023)   Overall Financial Resource Strain (CARDIA)    Difficulty of Paying Living Expenses: Very hard  Food Insecurity: Unknown (08/03/2023)   Hunger Vital Sign    Worried About Running Out of Food in the Last Year: Never true    Ran Out of Food in the Last Year: Patient declined  Transportation Needs: No Transportation Needs (08/03/2023)   PRAPARE - Administrator, Civil Service (Medical): No    Lack of Transportation (Non-Medical): No  Physical Activity: Unknown (08/03/2023)   Exercise Vital Sign    Days of Exercise per Week: 0 days    Minutes of Exercise per Session: Not on file  Stress: No Stress Concern Present (08/03/2023)   [employer] of Occupational Health - Occupational Stress Questionnaire    Feeling of Stress : Not at all  Social Connections: Moderately Integrated (08/03/2023)   Social Connection and Isolation Panel     Frequency of Communication with Friends and Family: Twice a week    Frequency of Social Gatherings with Friends and Family: Twice a week    Attends Religious Services: More than 4 times per year    Active Member of [employer] or Organizations: No    Attends Banker Meetings: Not on  file    Marital Status: Married  Catering Manager Violence: Not At Risk (08/06/2023)   Humiliation, Afraid, Rape, and Kick questionnaire    Fear of Current or Ex-Partner: No    Emotionally Abused: No    Physically Abused: No    Sexually Abused: No  Depression (PHQ2-9): Low Risk (12/24/2023)   Depression (PHQ2-9)    PHQ-2 Score: 1  Alcohol Screen: Not on file  Housing: Low Risk (08/03/2023)   Housing    Last Housing Risk Score: 0  Utilities: Not At Risk (08/06/2023)   AHC Utilities    Threatened with loss of utilities: No  Health Literacy: Adequate Health Literacy (08/06/2023)   B1300 Health Literacy    Frequency of need for help with medical instructions: Never   Current Outpatient Medications on File Prior to Visit  Medication Sig Dispense Refill   amLODipine  (NORVASC ) 10 MG tablet Take 1 tablet (10 mg total) by mouth daily. 90 tablet 1   atorvastatin  (LIPITOR) 80 MG tablet Take 1 tablet (80 mg total) by mouth daily at 6 PM. 90 tablet 1   blood glucose meter kit and supplies KIT by Does not apply route daily. Dispense based on patient and insurance preference. Use up to four times daily as directed.     Blood Glucose Monitoring Suppl (FREESTYLE LITE) w/Device KIT Use as instructed to test blood sugar 2 times daily, in the morning and the evening (Patient not taking: Reported on 09/20/2024) 1 kit 0   Blood Glucose Monitoring Suppl (TRUE METRIX METER) w/Device KIT Use as instructed. Check blood glucose level by fingerstick three times per day. E11.65 (Patient not taking: Reported on 09/20/2024) 1 kit 0   glucose blood test strip Use as instructed. Check blood glucose level by fingerstick three times  per day. 200 each 12   insulin  glargine, 1 Unit Dial , (TOUJEO  SOLOSTAR) 300 UNIT/ML Solostar Pen Inject 42 Units into the skin daily.     insulin  lispro (HUMALOG  KWIKPEN) 200 UNIT/ML KwikPen Inject 14-18 Units into the skin 3 (three) times daily.     Insulin  Pen Needle 32G X 4 MM MISC Use 4 (four) times daily. 300 each 3   Insulin  Syringe-Needle U-100 (INSULIN  SYRINGES) 31G X 5/16 0.5 ML MISC Use as instructed. Inject into the skin once nightly. 100 each 6   levothyroxine  (SYNTHROID ) 112 MCG tablet Take 1 tablet (112 mcg total) by mouth daily. 90 tablet 3   losartan -hydrochlorothiazide  (HYZAAR ) 100-25 MG tablet Take 1 tablet by mouth daily. 90 tablet 2   metFORMIN  (GLUCOPHAGE -XR) 500 MG 24 hr tablet Take 2 tablets (1,000 mg total) by mouth 2 (two) times daily with a meal. 360 tablet 3   TRUEplus Lancets 28G MISC Use as instructed. Check blood glucose level by fingerstick three times per day. E11.65 200 each 3   No current facility-administered medications on file prior to visit.   No Known Allergies Family History  Problem Relation Age of Onset   Cancer Mother    Thyroid  disease Mother    Heart attack Maternal Grandfather    Heart attack Maternal Grandmother    PE: LMP 12/05/2012  Wt Readings from Last 10 Encounters:  09/20/24 195 lb 9.6 oz (88.7 kg)  06/27/24 194 lb (88 kg)  02/21/24 191 lb 6.4 oz (86.8 kg)  12/24/23 191 lb 6.4 oz (86.8 kg)  11/04/23 192 lb (87.1 kg)  09/19/23 200 lb (90.7 kg)  09/10/23 199 lb 9.6 oz (90.5 kg)  08/06/23 202 lb 9.6 oz (91.9  kg)  05/06/23 204 lb 12.8 oz (92.9 kg)  04/26/23 206 lb (93.4 kg)   Constitutional: Slightly overweight, in NAD Eyes:  EOMI, no exophthalmos ENT: no neck masses, no cervical lymphadenopathy Cardiovascular: RRR, No MRG Respiratory: CTA B Musculoskeletal: no deformities except enlarged left clavicle head Skin:no rashes Neurological: no tremor with outstretched hands  ASSESSMENT: 1.  Postablation hypothyroidism  2.   Type 2 diabetes, uncontrolled, with complications - PN  3. HL  PLAN:  1. Patient with longstanding hypothyroidism, on levothyroxine  therapy - latest thyroid  labs reviewed with pt. >> normal: Lab Results  Component Value Date   TSH 2.48 06/27/2024  - she continues on LT4 112 mcg daily - pt feels good on this dose. - we discussed about taking the thyroid  hormone every day, with water, >30 minutes before breakfast, separated by >4 hours from acid reflux medications, calcium , iron, multivitamins. Pt. is taking it correctly.    2.  Uncontrolled type 2 diabetes -Patient with history of uncontrolled type 2 diabetes, with improved control starting Ozempic  but at last visit, sugars were extremely high, in the 200s and 300s.  Upon questioning, she reduced the dose of metformin  due to nausea and also came off Ozempic  for the same reason.  She described nausea after taking metformin  in the morning.  We discussed about trying to take it at night and also restarting the lower dose of Ozempic  and microdosing it up.  She agreed with this.  I also advised her to increase both of her insulin  doses and change the injection sites to the upper thighs as she was only using the abdomen. HbA1c at last visit was about higher than before, at 10.0%.  -I advised her to: Patient Instructions  Please continue Levothyroxine  112 mcg daily.  Take the thyroid  hormone every day, with water, at least 30 minutes before breakfast, separated by at least 4 hours from: - acid reflux medications - calcium  - iron - multivitamins  Please continue: - Metformin  ER 1000 mg with dinner - Ozempic  0.125 mg weekly (injects in upper thighs) - Basaglar   42 units at night - Humalog  15 min before meals:  14-18 units   On the Ozempic  0.5 mg pen: -  9 clicks  - 0.125 mg - 18 clicks - 0.25 mg - 27 clicks - 0.375 mg - 36 clicks - 0.5 mg   Please return in 3 months.  - we checked her HbA1c: 7%  - advised to check sugars at  different times of the day - 4x a day, rotating check times - advised for yearly eye exams >> she is UTD - return to clinic in 3 months  3. HL - Lipid panel showed fractions at goal except for slightly low HDL: Lab Results  Component Value Date   CHOL 110 02/21/2024   HDL 37 (L) 02/21/2024   LDLCALC 51 02/21/2024   TRIG 140 02/21/2024   CHOLHDL 3.0 02/21/2024  - She continues on atorvastatin  80 mg daily without side effects  Lela Fendt, MD PhD [facility]

## 2024-10-04 ENCOUNTER — Other Ambulatory Visit: Payer: Self-pay

## 2024-10-04 NOTE — Telephone Encounter (Signed)
 Patient picked up patient assistance - Log Noted

## 2024-10-05 ENCOUNTER — Other Ambulatory Visit

## 2024-10-05 DIAGNOSIS — Z006 Encounter for examination for normal comparison and control in clinical research program: Secondary | ICD-10-CM

## 2024-10-17 ENCOUNTER — Ambulatory Visit: Admitting: Internal Medicine

## 2024-12-25 ENCOUNTER — Ambulatory Visit: Payer: Self-pay | Admitting: Nurse Practitioner
